# Patient Record
Sex: Female | Born: 1955
Health system: Southern US, Community
[De-identification: ages and names within clinical notes are randomized; demographics above are authoritative.]

## PROBLEM LIST (undated history)

## (undated) DIAGNOSIS — E785 Hyperlipidemia, unspecified: Secondary | ICD-10-CM

## (undated) DIAGNOSIS — K219 Gastro-esophageal reflux disease without esophagitis: Secondary | ICD-10-CM

## (undated) DIAGNOSIS — E042 Nontoxic multinodular goiter: Secondary | ICD-10-CM

## (undated) DIAGNOSIS — R911 Solitary pulmonary nodule: Secondary | ICD-10-CM

## (undated) DIAGNOSIS — I4891 Unspecified atrial fibrillation: Secondary | ICD-10-CM

## (undated) DIAGNOSIS — D518 Other vitamin B12 deficiency anemias: Secondary | ICD-10-CM

## (undated) DIAGNOSIS — Z8669 Personal history of other diseases of the nervous system and sense organs: Secondary | ICD-10-CM

## (undated) DIAGNOSIS — I10 Essential (primary) hypertension: Secondary | ICD-10-CM

## (undated) DIAGNOSIS — E782 Mixed hyperlipidemia: Secondary | ICD-10-CM

## (undated) DIAGNOSIS — E1169 Type 2 diabetes mellitus with other specified complication: Secondary | ICD-10-CM

## (undated) HISTORY — DX: Unspecified atrial fibrillation: I48.91

## (undated) HISTORY — DX: Type 2 diabetes mellitus with other specified complication: E11.69

## (undated) HISTORY — DX: Solitary pulmonary nodule: R91.1

## (undated) HISTORY — DX: Hyperlipidemia, unspecified: E78.5

## (undated) HISTORY — DX: Other vitamin B12 deficiency anemias: D51.8

## (undated) HISTORY — PX: HYSTEROTOMY: SHX1776

## (undated) HISTORY — DX: Mixed hyperlipidemia: E78.2

## (undated) HISTORY — DX: Nontoxic multinodular goiter: E04.2

## (undated) HISTORY — DX: Gastro-esophageal reflux disease without esophagitis: K21.9

## (undated) HISTORY — DX: Essential (primary) hypertension: I10

## (undated) HISTORY — DX: Personal history of other diseases of the nervous system and sense organs: Z86.69

---

## 1992-12-28 LAB — HM PAP SMEAR

## 1993-06-27 HISTORY — PX: ABDOMINAL HYSTERECTOMY: SHX81

## 2000-11-06 ENCOUNTER — Emergency Department (HOSPITAL_COMMUNITY): Admission: EM | Admit: 2000-11-06 | Discharge: 2000-11-06 | Payer: Self-pay | Admitting: Emergency Medicine

## 2004-12-28 LAB — HM COLONOSCOPY

## 2005-02-24 ENCOUNTER — Ambulatory Visit: Payer: Self-pay | Admitting: Family Medicine

## 2005-03-06 ENCOUNTER — Ambulatory Visit: Payer: Self-pay | Admitting: Internal Medicine

## 2005-03-23 ENCOUNTER — Ambulatory Visit: Payer: Self-pay | Admitting: Internal Medicine

## 2005-04-01 ENCOUNTER — Ambulatory Visit: Payer: Self-pay | Admitting: Family Medicine

## 2005-05-20 ENCOUNTER — Ambulatory Visit: Payer: Self-pay | Admitting: Family Medicine

## 2006-02-22 ENCOUNTER — Ambulatory Visit: Payer: Self-pay | Admitting: Family Medicine

## 2010-02-18 ENCOUNTER — Encounter (INDEPENDENT_AMBULATORY_CARE_PROVIDER_SITE_OTHER): Payer: Self-pay | Admitting: *Deleted

## 2010-10-03 ENCOUNTER — Telehealth: Payer: Self-pay | Admitting: Internal Medicine

## 2011-01-29 NOTE — Progress Notes (Signed)
Summary: Schedule Colonoscopy  Phone Note Outgoing Call Call back at Home Phone [phone]   Call placed by: Harlow Mares CMA Duncan Dull),  October 03, 2010 9:11 AM Call placed to: Patient Summary of Call: patient aware she is due for a colonscopy and she states that she does not have the extra money at this time and she will call back when she does have the money. I advised the patient of the importance of having her procedure due to her family history of colon cancer.  Initial call taken by: Harlow Mares CMA (AAMA),  October 03, 2010 9:15 AM

## 2011-01-29 NOTE — Letter (Signed)
Summary: Colonoscopy Letter  [facility]  [address] [place], [place] [address]   Phone: [phone]  Fax: [phone]      February 18, 2010 MRN: [id number]   Rachel Hensley [address] [place], [place]  [address]   Dear Ms. Kipnis,   According to your medical record, it is time for you to schedule a Colonoscopy. The American Cancer Society recommends this procedure as a method to detect early colon cancer. Patients with a family history of colon cancer, or a personal history of colon polyps or inflammatory bowel disease are at increased risk.  This letter has beeen generated based on the recommendations made at the time of your procedure. If you feel that in your particular situation this may no longer apply, please contact our office.  Please call our office at [phone] to schedule this appointment or to update your records at your earliest convenience.  Thank you for cooperating with [place] to provide you with the very best care possible.   Sincerely,  Iva Boop, M.D.  [facility] [phone]

## 2017-03-18 DIAGNOSIS — K219 Gastro-esophageal reflux disease without esophagitis: Secondary | ICD-10-CM | POA: Diagnosis not present

## 2017-03-18 DIAGNOSIS — E1165 Type 2 diabetes mellitus with hyperglycemia: Secondary | ICD-10-CM | POA: Diagnosis not present

## 2017-03-18 DIAGNOSIS — E782 Mixed hyperlipidemia: Secondary | ICD-10-CM | POA: Diagnosis not present

## 2017-03-18 DIAGNOSIS — I10 Essential (primary) hypertension: Secondary | ICD-10-CM | POA: Diagnosis not present

## 2017-06-22 DIAGNOSIS — I10 Essential (primary) hypertension: Secondary | ICD-10-CM | POA: Diagnosis not present

## 2017-06-22 DIAGNOSIS — E119 Type 2 diabetes mellitus without complications: Secondary | ICD-10-CM | POA: Diagnosis not present

## 2017-06-22 DIAGNOSIS — E782 Mixed hyperlipidemia: Secondary | ICD-10-CM | POA: Diagnosis not present

## 2017-06-29 DIAGNOSIS — Z1231 Encounter for screening mammogram for malignant neoplasm of breast: Secondary | ICD-10-CM | POA: Diagnosis not present

## 2017-07-08 DIAGNOSIS — R5383 Other fatigue: Secondary | ICD-10-CM | POA: Diagnosis not present

## 2017-07-08 DIAGNOSIS — R079 Chest pain, unspecified: Secondary | ICD-10-CM | POA: Diagnosis not present

## 2017-07-08 DIAGNOSIS — R072 Precordial pain: Secondary | ICD-10-CM | POA: Diagnosis not present

## 2017-07-08 DIAGNOSIS — E119 Type 2 diabetes mellitus without complications: Secondary | ICD-10-CM | POA: Diagnosis not present

## 2017-07-08 DIAGNOSIS — Z7984 Long term (current) use of oral hypoglycemic drugs: Secondary | ICD-10-CM | POA: Diagnosis not present

## 2017-07-08 DIAGNOSIS — R0602 Shortness of breath: Secondary | ICD-10-CM | POA: Diagnosis not present

## 2017-07-08 DIAGNOSIS — R1013 Epigastric pain: Secondary | ICD-10-CM | POA: Diagnosis not present

## 2017-07-08 DIAGNOSIS — R7989 Other specified abnormal findings of blood chemistry: Secondary | ICD-10-CM | POA: Diagnosis not present

## 2017-07-08 DIAGNOSIS — R918 Other nonspecific abnormal finding of lung field: Secondary | ICD-10-CM | POA: Diagnosis not present

## 2017-07-09 DIAGNOSIS — R197 Diarrhea, unspecified: Secondary | ICD-10-CM | POA: Diagnosis not present

## 2017-07-09 DIAGNOSIS — R079 Chest pain, unspecified: Secondary | ICD-10-CM | POA: Diagnosis not present

## 2017-07-09 DIAGNOSIS — I10 Essential (primary) hypertension: Secondary | ICD-10-CM | POA: Diagnosis not present

## 2017-07-09 DIAGNOSIS — R10811 Right upper quadrant abdominal tenderness: Secondary | ICD-10-CM | POA: Diagnosis not present

## 2017-07-09 DIAGNOSIS — E785 Hyperlipidemia, unspecified: Secondary | ICD-10-CM | POA: Diagnosis not present

## 2017-07-09 DIAGNOSIS — E119 Type 2 diabetes mellitus without complications: Secondary | ICD-10-CM | POA: Diagnosis not present

## 2017-07-09 DIAGNOSIS — R112 Nausea with vomiting, unspecified: Secondary | ICD-10-CM | POA: Diagnosis not present

## 2017-07-10 DIAGNOSIS — R197 Diarrhea, unspecified: Secondary | ICD-10-CM | POA: Diagnosis not present

## 2017-07-10 DIAGNOSIS — I10 Essential (primary) hypertension: Secondary | ICD-10-CM | POA: Diagnosis not present

## 2017-07-10 DIAGNOSIS — E785 Hyperlipidemia, unspecified: Secondary | ICD-10-CM | POA: Diagnosis not present

## 2017-07-10 DIAGNOSIS — E119 Type 2 diabetes mellitus without complications: Secondary | ICD-10-CM | POA: Diagnosis not present

## 2017-07-10 DIAGNOSIS — R079 Chest pain, unspecified: Secondary | ICD-10-CM | POA: Diagnosis not present

## 2017-07-15 DIAGNOSIS — T783XXA Angioneurotic edema, initial encounter: Secondary | ICD-10-CM | POA: Diagnosis not present

## 2017-07-20 DIAGNOSIS — R911 Solitary pulmonary nodule: Secondary | ICD-10-CM | POA: Diagnosis not present

## 2017-07-20 DIAGNOSIS — K858 Other acute pancreatitis without necrosis or infection: Secondary | ICD-10-CM | POA: Diagnosis not present

## 2017-07-20 DIAGNOSIS — E042 Nontoxic multinodular goiter: Secondary | ICD-10-CM | POA: Diagnosis not present

## 2017-09-10 DIAGNOSIS — L03116 Cellulitis of left lower limb: Secondary | ICD-10-CM | POA: Diagnosis not present

## 2017-09-10 DIAGNOSIS — W57XXXA Bitten or stung by nonvenomous insect and other nonvenomous arthropods, initial encounter: Secondary | ICD-10-CM | POA: Diagnosis not present

## 2017-10-04 DIAGNOSIS — H40003 Preglaucoma, unspecified, bilateral: Secondary | ICD-10-CM | POA: Diagnosis not present

## 2017-10-04 DIAGNOSIS — E119 Type 2 diabetes mellitus without complications: Secondary | ICD-10-CM | POA: Diagnosis not present

## 2017-10-04 DIAGNOSIS — H524 Presbyopia: Secondary | ICD-10-CM | POA: Diagnosis not present

## 2017-10-04 DIAGNOSIS — H2513 Age-related nuclear cataract, bilateral: Secondary | ICD-10-CM | POA: Diagnosis not present

## 2017-10-19 DIAGNOSIS — H40003 Preglaucoma, unspecified, bilateral: Secondary | ICD-10-CM | POA: Diagnosis not present

## 2017-10-25 DIAGNOSIS — E119 Type 2 diabetes mellitus without complications: Secondary | ICD-10-CM | POA: Diagnosis not present

## 2017-10-25 DIAGNOSIS — I10 Essential (primary) hypertension: Secondary | ICD-10-CM | POA: Diagnosis not present

## 2017-10-25 DIAGNOSIS — E782 Mixed hyperlipidemia: Secondary | ICD-10-CM | POA: Diagnosis not present

## 2018-01-27 DIAGNOSIS — R21 Rash and other nonspecific skin eruption: Secondary | ICD-10-CM | POA: Diagnosis not present

## 2018-01-27 DIAGNOSIS — I10 Essential (primary) hypertension: Secondary | ICD-10-CM | POA: Diagnosis not present

## 2018-01-27 DIAGNOSIS — E782 Mixed hyperlipidemia: Secondary | ICD-10-CM | POA: Diagnosis not present

## 2018-01-27 DIAGNOSIS — E119 Type 2 diabetes mellitus without complications: Secondary | ICD-10-CM | POA: Diagnosis not present

## 2018-01-31 DIAGNOSIS — D518 Other vitamin B12 deficiency anemias: Secondary | ICD-10-CM | POA: Diagnosis not present

## 2018-02-07 DIAGNOSIS — E538 Deficiency of other specified B group vitamins: Secondary | ICD-10-CM | POA: Diagnosis not present

## 2018-02-14 DIAGNOSIS — E538 Deficiency of other specified B group vitamins: Secondary | ICD-10-CM | POA: Diagnosis not present

## 2018-02-21 DIAGNOSIS — E538 Deficiency of other specified B group vitamins: Secondary | ICD-10-CM | POA: Diagnosis not present

## 2018-03-21 DIAGNOSIS — E538 Deficiency of other specified B group vitamins: Secondary | ICD-10-CM | POA: Diagnosis not present

## 2018-03-22 DIAGNOSIS — T7849XA Other allergy, initial encounter: Secondary | ICD-10-CM | POA: Diagnosis not present

## 2018-04-05 ENCOUNTER — Encounter: Payer: Self-pay | Admitting: Allergy and Immunology

## 2018-04-05 ENCOUNTER — Ambulatory Visit: Payer: Commercial Managed Care - PPO | Admitting: Allergy and Immunology

## 2018-04-05 VITALS — BP 130/80 | HR 82 | Temp 98.2°F | Resp 18 | Ht <= 58 in | Wt 163.4 lb

## 2018-04-05 DIAGNOSIS — J310 Chronic rhinitis: Secondary | ICD-10-CM | POA: Diagnosis not present

## 2018-04-05 DIAGNOSIS — H1013 Acute atopic conjunctivitis, bilateral: Secondary | ICD-10-CM | POA: Diagnosis not present

## 2018-04-05 DIAGNOSIS — H1010 Acute atopic conjunctivitis, unspecified eye: Secondary | ICD-10-CM | POA: Insufficient documentation

## 2018-04-05 DIAGNOSIS — L500 Allergic urticaria: Secondary | ICD-10-CM | POA: Diagnosis not present

## 2018-04-05 DIAGNOSIS — T7840XD Allergy, unspecified, subsequent encounter: Secondary | ICD-10-CM

## 2018-04-05 DIAGNOSIS — T783XXD Angioneurotic edema, subsequent encounter: Secondary | ICD-10-CM | POA: Diagnosis not present

## 2018-04-05 DIAGNOSIS — T783XXA Angioneurotic edema, initial encounter: Secondary | ICD-10-CM | POA: Insufficient documentation

## 2018-04-05 DIAGNOSIS — T7840XA Allergy, unspecified, initial encounter: Secondary | ICD-10-CM | POA: Insufficient documentation

## 2018-04-05 DIAGNOSIS — E042 Nontoxic multinodular goiter: Secondary | ICD-10-CM | POA: Diagnosis not present

## 2018-04-05 HISTORY — DX: Angioneurotic edema, initial encounter: T78.3XXA

## 2018-04-05 MED ORDER — AZELASTINE HCL 0.1 % NA SOLN: NASAL | 5 refills | Status: DC

## 2018-04-05 MED ORDER — CARBINOXAMINE MALEATE 6 MG PO TABS: 6.0000 mg | ORAL_TABLET | Freq: Three times a day (TID) | ORAL | 5 refills | Status: DC | PRN

## 2018-04-05 NOTE — Assessment & Plan Note (Addendum)
   I have recommended eye lubricant drops (i.e., Natural Tears) as needed.

## 2018-04-05 NOTE — Assessment & Plan Note (Signed)
   A prescription has been provided for carbinoxamine maleate 6mg  every 8 hours as needed.  A prescription has been provided for azelastine nasal spray, 1-2 sprays per nostril 2 times daily as needed. Proper nasal spray technique has been discussed and demonstrated.   Nasal saline spray (i.e., Simply Saline) or nasal saline lavage (i.e., NeilMed) is recommended as needed and prior to medicated nasal sprays.

## 2018-04-05 NOTE — Progress Notes (Signed)
New Patient Note  RE: Rachel Hensley MRN: [id number] DOB: November 30, 1956 Date of Office Visit: 04/05/2018  Referring provider: Gus HeightJohnson, Andrea, PA-C Primary care provider: Gus HeightJohnson, Andrea, PA-C  Chief Complaint: Angioedema; Allergic Reaction; and Nasal Congestion   History of present illness: Rachel Hensley is a 62 y.o. female seen today in consultation requested by Gus HeightAndrea Johnson, PA-C. She reports that in the summer 2018 she developed tongue swelling.  She was on lisinopril at that time and this medication was discontinued.  However, she has had 4 additional episodes of tongue swelling.  The 2 most recent episodes occurred 2 weeks ago and this morning.  She does experience mild prodromal tingling of the tongue.  She does not experience concomitant urticaria, cardiopulmonary symptoms, or other GI symptoms.  No specific medication, food, skin care product, detergent, soap, or other environmental triggers have been identified, she states that the angioedema "pops up out of nowhere." She has a history of allergic urticaria versus cholinergic urticaria, however this problem resolved several years ago.  During that time, she reports that she would develop "one big welt "on her back after mowing the yard.    Rachel Hensley has nasal congestion, rhinorrhea, postnasal drainage, ocular pruritus, and occasional sinus pressure.  No significant seasonal symptom variation has been noted nor have specific environmental triggers been identified.  Assessment and plan: Angioedema Unclear etiology.  Food allergen skin testing was negative today despite a positive histamine control.  The patient is not taking an ACE inhibitor. NSAIDs may exacerbate angioedema but in this case are not the underlying etiology as demonstrated by the fact that the patient has experienced angioedema in the absence of NSAIDs. There are no concomitant symptoms concerning for anaphylaxis or constitutional symptoms worrisome for an underlying  malignancy. The patient has developed urticaria in the past, however apparently not in association with the episodes of angioedema. We will order labs to rule out hereditary angioedema, acquired angioedema, urticaria associated angioedema, and other potential etiologies.   The following labs have been ordered: TSH, antithyroglobulin antibody, thyroid peroxidase antibody, FCeRI antibody, tryptase, C4, C1 esterase inhibitor (quantitative and functional), C1q, factor XII, CBC, CMP, and galactose-alpha-1,3-galactose IgE level.  The patient will be notified with further recommendations after lab results have returned.  Should symptoms recur, a  journal is to be kept recording any foods eaten, beverages consumed, medications taken, activities performed, and environmental conditions within a 6 hour period prior to the onset of symptoms. For any symptoms concerning for anaphylaxis, 911 is to be called immediately.  Chronic rhinitis  A prescription has been provided for carbinoxamine maleate 6mg  every 8 hours as needed.  A prescription has been provided for azelastine nasal spray, 1-2 sprays per nostril 2 times daily as needed. Proper nasal spray technique has been discussed and demonstrated.   Nasal saline spray (i.e., Simply Saline) or nasal saline lavage (i.e., NeilMed) is recommended as needed and prior to medicated nasal sprays.  Conjunctivitis  I have recommended eye lubricant drops (i.e., Natural Tears) as needed.   Meds ordered this encounter  Medications  . Carbinoxamine Maleate 6 MG TABS    Sig: Take 6 mg by mouth every 8 (eight) hours as needed.    Dispense:  30 tablet    Refill:  5  . azelastine (ASTELIN) 0.1 % nasal spray    Sig: 1-2 sprays per nostril 2 times daily    Dispense:  30 mL    Refill:  5    Diagnostics: Environmental skin testing:  Negative despite a positive histamine control. Food allergen skin testing: Negative despite a positive histamine control.    Physical  examination: Blood pressure 130/80, pulse 82, temperature 98.2 F (36.8 C), temperature source Oral, resp. rate 18, height 4' 4.5" (1.334 m), weight 163 lb 6.4 oz (74.1 kg), SpO2 98 %.  General: Alert, interactive, in no acute distress. HEENT: TMs pearly gray, turbinates mildly edematous without discharge, post-pharynx mildly erythematous. Neck: Supple without lymphadenopathy. Lungs: Clear to auscultation without wheezing, rhonchi or rales. CV: Normal S1, S2 without murmurs. Abdomen: Nondistended, nontender. Skin: Warm and dry, without lesions or rashes. Extremities:  No clubbing, cyanosis or edema. Neuro:   Grossly intact.  Review of systems:  Review of systems negative except as noted in HPI / PMHx or noted below: Review of Systems  Constitutional: Negative.   HENT: Negative.   Eyes: Negative.   Respiratory: Negative.   Cardiovascular: Negative.   Gastrointestinal: Negative.   Genitourinary: Negative.   Musculoskeletal: Negative.   Skin: Negative.   Neurological: Negative.   Endo/Heme/Allergies: Negative.   Psychiatric/Behavioral: Negative.     Past medical history:  History reviewed. No pertinent past medical history.  Past surgical history:  Past Surgical History:  Procedure Laterality Date  . HYSTEROTOMY     partial    Family history: Family History  Problem Relation Age of Onset  . Eczema Grandchild   . Atopy Grandchild   . Asthma Son        childhood asthma  . Allergic rhinitis Neg Hx   . Angioedema Neg Hx   . Immunodeficiency Neg Hx   . Urticaria Neg Hx     Social history: Social History   Socioeconomic History  . Marital status: Married    Spouse name: Not on file  . Number of children: Not on file  . Years of education: Not on file  . Highest education level: Not on file  Occupational History  . Not on file  Social Needs  . Financial resource strain: Not on file  . Food insecurity:    Worry: Not on file    Inability: Not on file  .  Transportation needs:    Medical: Not on file    Non-medical: Not on file  Tobacco Use  . Smoking status: Never Smoker  . Smokeless tobacco: Never Used  Substance and Sexual Activity  . Alcohol use: Never    Frequency: Never  . Drug use: Never  . Sexual activity: Not on file  Lifestyle  . Physical activity:    Days per week: Not on file    Minutes per session: Not on file  . Stress: Not on file  Relationships  . Social connections:    Talks on phone: Not on file    Gets together: Not on file    Attends religious service: Not on file    Active member of club or organization: Not on file    Attends meetings of clubs or organizations: Not on file    Relationship status: Not on file  . Intimate partner violence:    Fear of current or ex partner: Not on file    Emotionally abused: Not on file    Physically abused: Not on file    Forced sexual activity: Not on file  Other Topics Concern  . Not on file  Social History Narrative  . Not on file   Environmental History: The patient lives in a 64 year old house with carpeting throughout and central air/heat.  She is  a non-smoker without pets.  There is no known mold/water damage in the home.  Allergies as of 04/05/2018      Reactions   Prednisone Swelling      Medication List        Accurate as of 04/05/18 12:38 PM. Always use your most recent med list.          atorvastatin 20 MG tablet Commonly known as:  LIPITOR Take 20 mg by mouth daily.   azelastine 0.1 % nasal spray Commonly known as:  ASTELIN 1-2 sprays per nostril 2 times daily   bisoprolol-hydrochlorothiazide 5-6.25 MG tablet Commonly known as:  ZIAC Take 6.25 tablets by mouth daily.   Carbinoxamine Maleate 6 MG Tabs Take 6 mg by mouth every 8 (eight) hours as needed.   cetirizine 10 MG tablet Commonly known as:  ZYRTEC Take 10 mg by mouth daily.   EPINEPHrine 0.3 mg/0.3 mL Soaj injection Commonly known as:  EPI-PEN Inject 0.3 mg into the muscle once.    EUCRISA 2 % Oint Generic drug:  Crisaborole Apply 1 application topically daily.   glimepiride 4 MG tablet Commonly known as:  AMARYL Take 4 mg by mouth daily.   GLYXAMBI 25-5 MG Tabs Generic drug:  Empagliflozin-Linagliptin Take 25 mg by mouth daily.   levocetirizine 5 MG tablet Commonly known as:  XYZAL Take 5 mg by mouth every evening.   metFORMIN 1000 MG tablet Commonly known as:  GLUCOPHAGE Take 1,000 mg by mouth 2 (two) times daily.   omeprazole 20 MG capsule Commonly known as:  PRILOSEC Take 20 mg by mouth 2 (two) times daily.   promethazine 25 MG tablet Commonly known as:  PHENERGAN Take 25 mg by mouth every 6 (six) hours as needed for nausea or vomiting.   ranitidine 150 MG capsule Commonly known as:  ZANTAC Take 150 mg by mouth daily.       Known medication allergies: Allergies  Allergen Reactions  . Prednisone Swelling    I appreciate the opportunity to take part in Micky's care. Please do not hesitate to contact me with questions.  Sincerely,   R. Jorene Guest, MD

## 2018-04-05 NOTE — Assessment & Plan Note (Signed)
Unclear etiology.  Food allergen skin testing was negative today despite a positive histamine control.  The patient is not taking an ACE inhibitor. NSAIDs may exacerbate angioedema but in this case are not the underlying etiology as demonstrated by the fact that the patient has experienced angioedema in the absence of NSAIDs. There are no concomitant symptoms concerning for anaphylaxis or constitutional symptoms worrisome for an underlying malignancy. The patient has developed urticaria in the past, however apparently not in association with the episodes of angioedema. We will order labs to rule out hereditary angioedema, acquired angioedema, urticaria associated angioedema, and other potential etiologies.   The following labs have been ordered: TSH, antithyroglobulin antibody, thyroid peroxidase antibody, FCeRI antibody, tryptase, C4, C1 esterase inhibitor (quantitative and functional), C1q, factor XII, CBC, CMP, and galactose-alpha-1,3-galactose IgE level.  The patient will be notified with further recommendations after lab results have returned.  Should symptoms recur, a  journal is to be kept recording any foods eaten, beverages consumed, medications taken, activities performed, and environmental conditions within a 6 hour period prior to the onset of symptoms. For any symptoms concerning for anaphylaxis, 911 is to be called immediately.

## 2018-04-05 NOTE — Patient Instructions (Addendum)
Angioedema Unclear etiology.  Food allergen skin testing was negative today despite a positive histamine control.  The patient is not taking an ACE inhibitor. NSAIDs may exacerbate angioedema but in this case are not the underlying etiology as demonstrated by the fact that the patient has experienced angioedema in the absence of NSAIDs. There are no concomitant symptoms concerning for anaphylaxis or constitutional symptoms worrisome for an underlying malignancy. The patient has developed urticaria in the past, however apparently not in association with the episodes of angioedema. We will order labs to rule out hereditary angioedema, acquired angioedema, urticaria associated angioedema, and other potential etiologies.   The following labs have been ordered: TSH, antithyroglobulin antibody, thyroid peroxidase antibody, FCeRI antibody, tryptase, C4, C1 esterase inhibitor (quantitative and functional), C1q, factor XII, CBC, CMP, and galactose-alpha-1,3-galactose IgE level.  The patient will be notified with further recommendations after lab results have returned.  Should symptoms recur, a  journal is to be kept recording any foods eaten, beverages consumed, medications taken, activities performed, and environmental conditions within a 6 hour period prior to the onset of symptoms. For any symptoms concerning for anaphylaxis, 911 is to be called immediately.  Chronic rhinitis  A prescription has been provided for carbinoxamine maleate 6mg  every 8 hours as needed.  A prescription has been provided for azelastine nasal spray, 1-2 sprays per nostril 2 times daily as needed. Proper nasal spray technique has been discussed and demonstrated.   Nasal saline spray (i.e., Simply Saline) or nasal saline lavage (i.e., NeilMed) is recommended as needed and prior to medicated nasal sprays.  Conjunctivitis  I have recommended eye lubricant drops (i.e., Natural Tears) as needed.   When lab results have returned the  patient will be called with further recommendations and follow up instructions.

## 2018-04-06 ENCOUNTER — Telehealth: Payer: Self-pay

## 2018-04-06 NOTE — Telephone Encounter (Signed)
Pharmacy called and stated that insurance is requiring patient to try and fail Carbinoxamine Maleate 4 mg tab or liquid. Please advise

## 2018-04-06 NOTE — Telephone Encounter (Signed)
Carbinoxamine 4 mg every 8 hours as needed.

## 2018-04-07 MED ORDER — CARBINOXAMINE MALEATE 4 MG PO TABS: 1.0000 | ORAL_TABLET | Freq: Three times a day (TID) | ORAL | 3 refills | Status: DC | PRN

## 2018-04-07 NOTE — Addendum Note (Signed)
Addended by: Mliss FritzBLACK, Sentoria Brent I on: 04/07/2018 08:18 AM   Modules accepted: Orders

## 2018-04-07 NOTE — Telephone Encounter (Signed)
Prescription has been sent in and I left a detailed message for the patient advising her of medication change.

## 2018-04-12 LAB — COMPREHENSIVE METABOLIC PANEL
ALT: 22 IU/L (ref 0–32)
AST: 15 IU/L (ref 0–40)
Albumin/Globulin Ratio: 1.3 (ref 1.2–2.2)
Albumin: 4.3 g/dL (ref 3.6–4.8)
Alkaline Phosphatase: 91 IU/L (ref 39–117)
BUN/Creatinine Ratio: 22 (ref 12–28)
BUN: 16 mg/dL (ref 8–27)
Bilirubin Total: 0.4 mg/dL (ref 0.0–1.2)
CO2: 25 mmol/L (ref 20–29)
Calcium: 9.9 mg/dL (ref 8.7–10.3)
Chloride: 100 mmol/L (ref 96–106)
Creatinine, Ser: 0.72 mg/dL (ref 0.57–1.00)
GFR calc Af Amer: 103 mL/min/{1.73_m2} (ref >59)
GFR calc non Af Amer: 89 mL/min/{1.73_m2} (ref >59)
Globulin, Total: 3.2 g/dL (ref 1.5–4.5)
Glucose: 103 mg/dL — ABNORMAL HIGH (ref 65–99)
Potassium: 4.3 mmol/L (ref 3.5–5.2)
Sodium: 142 mmol/L (ref 134–144)
Total Protein: 7.5 g/dL (ref 6.0–8.5)

## 2018-04-12 LAB — ALPHA-GAL PANEL
Alpha Gal IgE*: <0.1 kU/L (ref <0.10)
Beef (Bos spp) IgE: 0.1 kU/L (ref <0.35)
Class Interpretation: 0
Class Interpretation: 0
Lamb/Mutton (Ovis spp) IgE: <0.1 kU/L (ref <0.35)
Pork (Sus spp) IgE: <0.1 kU/L (ref <0.35)

## 2018-04-12 LAB — CBC
Hematocrit: 37.4 % (ref 34.0–46.6)
Hemoglobin: 11.8 g/dL (ref 11.1–15.9)
MCH: 24.5 pg — ABNORMAL LOW (ref 26.6–33.0)
MCHC: 31.6 g/dL (ref 31.5–35.7)
MCV: 78 fL — ABNORMAL LOW (ref 79–97)
Platelets: 392 10*3/uL — ABNORMAL HIGH (ref 150–379)
RBC: 4.81 x10E6/uL (ref 3.77–5.28)
RDW: 18.5 % — ABNORMAL HIGH (ref 12.3–15.4)
WBC: 13.4 10*3/uL — ABNORMAL HIGH (ref 3.4–10.8)

## 2018-04-12 LAB — TRYPTASE: Tryptase: 15.5 ug/L — ABNORMAL HIGH (ref 2.2–13.2)

## 2018-04-12 LAB — CHRONIC URTICARIA: cu index: <1.8 (ref <10)

## 2018-04-12 LAB — C4 COMPLEMENT: Complement C4, Serum: 33 mg/dL (ref 14–44)

## 2018-04-12 LAB — C1 ESTERASE INHIBITOR: C1INH SerPl-mCnc: 28 mg/dL (ref 21–39)

## 2018-04-12 LAB — THYROID PEROXIDASE ANTIBODY: Thyroperoxidase Ab SerPl-aCnc: 11 IU/mL (ref 0–34)

## 2018-04-12 LAB — THYROGLOBULIN LEVEL: Thyroglobulin (TG-RIA): 30 ng/mL

## 2018-04-12 LAB — FACTOR 12 ASSAY: Factor XII Activity: 134 % (ref 50–150)

## 2018-04-12 LAB — COMPLEMENT COMPONENT C1Q: Complement C1Q: 13.7 mg/dL (ref 11.8–24.4)

## 2018-04-12 LAB — C1 ESTERASE INHIBITOR, FUNCTIONAL: C1INH Functional/C1INH Total MFr SerPl: 91 %mean normal

## 2018-04-13 DIAGNOSIS — E042 Nontoxic multinodular goiter: Secondary | ICD-10-CM | POA: Diagnosis not present

## 2018-04-21 DIAGNOSIS — E538 Deficiency of other specified B group vitamins: Secondary | ICD-10-CM | POA: Diagnosis not present

## 2018-04-21 DIAGNOSIS — H40003 Preglaucoma, unspecified, bilateral: Secondary | ICD-10-CM | POA: Diagnosis not present

## 2018-04-28 DIAGNOSIS — I10 Essential (primary) hypertension: Secondary | ICD-10-CM | POA: Diagnosis not present

## 2018-04-28 DIAGNOSIS — D518 Other vitamin B12 deficiency anemias: Secondary | ICD-10-CM | POA: Diagnosis not present

## 2018-04-28 DIAGNOSIS — E782 Mixed hyperlipidemia: Secondary | ICD-10-CM | POA: Diagnosis not present

## 2018-04-28 DIAGNOSIS — E1169 Type 2 diabetes mellitus with other specified complication: Secondary | ICD-10-CM | POA: Diagnosis not present

## 2018-05-18 DIAGNOSIS — D509 Iron deficiency anemia, unspecified: Secondary | ICD-10-CM | POA: Diagnosis not present

## 2018-05-18 DIAGNOSIS — Z800 Family history of malignant neoplasm of digestive organs: Secondary | ICD-10-CM | POA: Diagnosis not present

## 2018-05-18 DIAGNOSIS — Z8051 Family history of malignant neoplasm of kidney: Secondary | ICD-10-CM | POA: Diagnosis not present

## 2018-05-18 DIAGNOSIS — Z8042 Family history of malignant neoplasm of prostate: Secondary | ICD-10-CM | POA: Diagnosis not present

## 2018-05-24 DIAGNOSIS — E538 Deficiency of other specified B group vitamins: Secondary | ICD-10-CM | POA: Diagnosis not present

## 2018-06-20 DIAGNOSIS — E538 Deficiency of other specified B group vitamins: Secondary | ICD-10-CM | POA: Diagnosis not present

## 2018-07-25 DIAGNOSIS — E538 Deficiency of other specified B group vitamins: Secondary | ICD-10-CM | POA: Diagnosis not present

## 2018-08-01 DIAGNOSIS — D518 Other vitamin B12 deficiency anemias: Secondary | ICD-10-CM | POA: Diagnosis not present

## 2018-08-01 DIAGNOSIS — E1169 Type 2 diabetes mellitus with other specified complication: Secondary | ICD-10-CM | POA: Diagnosis not present

## 2018-08-01 DIAGNOSIS — E782 Mixed hyperlipidemia: Secondary | ICD-10-CM | POA: Diagnosis not present

## 2018-08-01 DIAGNOSIS — I10 Essential (primary) hypertension: Secondary | ICD-10-CM | POA: Diagnosis not present

## 2018-08-01 DIAGNOSIS — R002 Palpitations: Secondary | ICD-10-CM | POA: Diagnosis not present

## 2018-08-23 DIAGNOSIS — D518 Other vitamin B12 deficiency anemias: Secondary | ICD-10-CM | POA: Diagnosis not present

## 2018-08-23 DIAGNOSIS — Z1231 Encounter for screening mammogram for malignant neoplasm of breast: Secondary | ICD-10-CM | POA: Diagnosis not present

## 2018-09-21 DIAGNOSIS — D518 Other vitamin B12 deficiency anemias: Secondary | ICD-10-CM | POA: Diagnosis not present

## 2018-10-20 DIAGNOSIS — D518 Other vitamin B12 deficiency anemias: Secondary | ICD-10-CM | POA: Diagnosis not present

## 2018-11-02 DIAGNOSIS — I10 Essential (primary) hypertension: Secondary | ICD-10-CM | POA: Diagnosis not present

## 2018-11-02 DIAGNOSIS — D518 Other vitamin B12 deficiency anemias: Secondary | ICD-10-CM | POA: Diagnosis not present

## 2018-11-02 DIAGNOSIS — E1169 Type 2 diabetes mellitus with other specified complication: Secondary | ICD-10-CM | POA: Diagnosis not present

## 2018-11-02 DIAGNOSIS — E782 Mixed hyperlipidemia: Secondary | ICD-10-CM | POA: Diagnosis not present

## 2018-12-28 LAB — HM DIABETES EYE EXAM

## 2019-01-13 DIAGNOSIS — H524 Presbyopia: Secondary | ICD-10-CM | POA: Diagnosis not present

## 2019-01-13 DIAGNOSIS — H40003 Preglaucoma, unspecified, bilateral: Secondary | ICD-10-CM | POA: Diagnosis not present

## 2019-02-03 DIAGNOSIS — I10 Essential (primary) hypertension: Secondary | ICD-10-CM | POA: Diagnosis not present

## 2019-02-03 DIAGNOSIS — E1169 Type 2 diabetes mellitus with other specified complication: Secondary | ICD-10-CM | POA: Diagnosis not present

## 2019-02-03 DIAGNOSIS — E782 Mixed hyperlipidemia: Secondary | ICD-10-CM | POA: Diagnosis not present

## 2019-05-10 DIAGNOSIS — E1169 Type 2 diabetes mellitus with other specified complication: Secondary | ICD-10-CM | POA: Diagnosis not present

## 2019-05-10 DIAGNOSIS — I10 Essential (primary) hypertension: Secondary | ICD-10-CM | POA: Diagnosis not present

## 2019-05-10 DIAGNOSIS — E782 Mixed hyperlipidemia: Secondary | ICD-10-CM | POA: Diagnosis not present

## 2019-05-10 LAB — MICROALBUMIN, URINE: Microalb, Ur: POSITIVE

## 2019-05-18 DIAGNOSIS — J060 Acute laryngopharyngitis: Secondary | ICD-10-CM | POA: Diagnosis not present

## 2019-11-21 LAB — HM MAMMOGRAPHY: HM Mammogram: NORMAL (ref 0–4)

## 2020-02-15 ENCOUNTER — Other Ambulatory Visit: Payer: Self-pay

## 2020-02-15 MED ORDER — GLIMEPIRIDE 4 MG PO TABS: 4.0000 mg | ORAL_TABLET | Freq: Two times a day (BID) | ORAL | 0 refills | Status: DC

## 2020-02-15 MED ORDER — BISOPROLOL-HYDROCHLOROTHIAZIDE 5-6.25 MG PO TABS: 6.2500 | ORAL_TABLET | Freq: Every day | ORAL | 0 refills | Status: DC

## 2020-02-16 ENCOUNTER — Ambulatory Visit: Payer: Commercial Managed Care - PPO | Admitting: Family Medicine

## 2020-02-19 ENCOUNTER — Ambulatory Visit: Payer: Commercial Managed Care - PPO | Admitting: Family Medicine

## 2020-02-19 ENCOUNTER — Other Ambulatory Visit: Payer: Self-pay

## 2020-02-19 ENCOUNTER — Encounter: Payer: Self-pay | Admitting: Family Medicine

## 2020-02-19 VITALS — BP 132/76 | HR 80 | Temp 98.2°F | Resp 16 | Ht 63.0 in | Wt 154.2 lb

## 2020-02-19 DIAGNOSIS — E782 Mixed hyperlipidemia: Secondary | ICD-10-CM | POA: Diagnosis not present

## 2020-02-19 DIAGNOSIS — Z23 Encounter for immunization: Secondary | ICD-10-CM

## 2020-02-19 DIAGNOSIS — I152 Hypertension secondary to endocrine disorders: Secondary | ICD-10-CM | POA: Insufficient documentation

## 2020-02-19 DIAGNOSIS — I10 Essential (primary) hypertension: Secondary | ICD-10-CM | POA: Diagnosis not present

## 2020-02-19 DIAGNOSIS — K219 Gastro-esophageal reflux disease without esophagitis: Secondary | ICD-10-CM

## 2020-02-19 DIAGNOSIS — E785 Hyperlipidemia, unspecified: Secondary | ICD-10-CM | POA: Insufficient documentation

## 2020-02-19 DIAGNOSIS — E1121 Type 2 diabetes mellitus with diabetic nephropathy: Secondary | ICD-10-CM | POA: Insufficient documentation

## 2020-02-19 DIAGNOSIS — E1159 Type 2 diabetes mellitus with other circulatory complications: Secondary | ICD-10-CM | POA: Insufficient documentation

## 2020-02-19 DIAGNOSIS — Z6827 Body mass index (BMI) 27.0-27.9, adult: Secondary | ICD-10-CM | POA: Insufficient documentation

## 2020-02-19 HISTORY — DX: Type 2 diabetes mellitus with diabetic nephropathy: E11.21

## 2020-02-19 LAB — POCT UA - MICROALBUMIN: Microalbumin Ur, POC: 150 mg/L

## 2020-02-19 NOTE — Assessment & Plan Note (Signed)
Well controlled.  No changes to medicines.  Continue to work on eating a healthy diet and exercise.  Labs drawn today.  Recommend check sugars daily.

## 2020-02-19 NOTE — Assessment & Plan Note (Signed)
Well controlled.  No changes to medicines.  Continue to work on eating a healthy diet and exercise.  Labs drawn today.  Unable take ace inhibitors  Or arbs.

## 2020-02-19 NOTE — Assessment & Plan Note (Signed)
Well controlled.  ?No changes to medicines.  ?Continue to work on eating a healthy diet and exercise.  ?Labs drawn today.  ?

## 2020-02-19 NOTE — Assessment & Plan Note (Signed)
Pneumovax 23 given.

## 2020-02-19 NOTE — Patient Instructions (Signed)
Essential hypertension, benign Well controlled.  No changes to medicines.  Continue to work on eating a healthy diet and exercise.  Labs drawn today.   GERD without esophagitis Well controlled.  No changes to medicines.  Continue to work on eating a healthy diet and exercise.  Labs drawn today.   Diabetic glomerulopathy (HCC) Well controlled.  No changes to medicines.  Continue to work on eating a healthy diet and exercise.  Labs drawn today.  Unable take ace inhibitors  Or arbs.  Mixed hyperlipidemia Well controlled.  No changes to medicines.  Continue to work on eating a healthy diet and exercise.  Labs drawn today.  Recommend check sugars daily.   Diabetes Basics  Diabetes (diabetes mellitus) is a long-term (chronic) disease. It occurs when the body does not properly use sugar (glucose) that is released from food after you eat. Diabetes may be caused by one or both of these problems:  Your pancreas does not make enough of a hormone called insulin.  Your body does not react in a normal way to insulin that it makes. Insulin lets sugars (glucose) go into cells in your body. This gives you energy. If you have diabetes, sugars cannot get into cells. This causes high blood sugar (hyperglycemia). Follow these instructions at home: How is diabetes treated? You may need to take insulin or other diabetes medicines daily to keep your blood sugar in balance. Take your diabetes medicines every day as told by your doctor. List your diabetes medicines here: Diabetes medicines  Name of medicine: ______________________________ ? Amount (dose): _______________ Time (a.m./p.m.): _______________ Notes: ___________________________________  Name of medicine: ______________________________ ? Amount (dose): _______________ Time (a.m./p.m.): _______________ Notes: ___________________________________  Name of medicine: ______________________________ ? Amount (dose): _______________ Time  (a.m./p.m.): _______________ Notes: ___________________________________ If you use insulin, you will learn how to give yourself insulin by injection. You may need to adjust the amount based on the food that you eat. List the types of insulin you use here: Insulin  Insulin type: ______________________________ ? Amount (dose): _______________ Time (a.m./p.m.): _______________ Notes: ___________________________________  Insulin type: ______________________________ ? Amount (dose): _______________ Time (a.m./p.m.): _______________ Notes: ___________________________________  Insulin type: ______________________________ ? Amount (dose): _______________ Time (a.m./p.m.): _______________ Notes: ___________________________________  Insulin type: ______________________________ ? Amount (dose): _______________ Time (a.m./p.m.): _______________ Notes: ___________________________________  Insulin type: ______________________________ ? Amount (dose): _______________ Time (a.m./p.m.): _______________ Notes: ___________________________________ How do I manage my blood sugar?  Check your blood sugar levels using a blood glucose monitor as directed by your doctor. Your doctor will set treatment goals for you. Generally, you should have these blood sugar levels:  Before meals (preprandial): 80-130 mg/dL (9.3-8.1 mmol/L).  After meals (postprandial): below 180 mg/dL (10 mmol/L).  A1c level: less than 7%. Write down the times that you will check your blood sugar levels: Blood sugar checks  Time: _______________ Notes: ___________________________________  Time: _______________ Notes: ___________________________________  Time: _______________ Notes: ___________________________________  Time: _______________ Notes: ___________________________________  Time: _______________ Notes: ___________________________________  Time: _______________ Notes: ___________________________________  What do I need  to know about low blood sugar? Low blood sugar is called hypoglycemia. This is when blood sugar is at or below 70 mg/dL (3.9 mmol/L). Symptoms may include:  Feeling: ? Hungry. ? Worried or nervous (anxious). ? Sweaty and clammy. ? Confused. ? Dizzy. ? Sleepy. ? Sick to your stomach (nauseous).  Having: ? A fast heartbeat. ? A headache. ? A change in your vision. ? Tingling or no feeling (numbness) around the mouth, lips, or tongue. ?  Jerky movements that you cannot control (seizure).  Having trouble with: ? Moving (coordination). ? Sleeping. ? Passing out (fainting). ? Getting upset easily (irritability). Treating low blood sugar To treat low blood sugar, eat or drink something sugary right away. If you can think clearly and swallow safely, follow the 15:15 rule:  Take 15 grams of a fast-acting carb (carbohydrate). Talk with your doctor about how much you should take.  Some fast-acting carbs are: ? Sugar tablets (glucose pills). Take 3-4 glucose pills. ? 6-8 pieces of hard candy. ? 4-6 oz (120-150 mL) of fruit juice. ? 4-6 oz (120-150 mL) of regular (not diet) soda. ? 1 Tbsp (15 mL) honey or sugar.  Check your blood sugar 15 minutes after you take the carb.  If your blood sugar is still at or below 70 mg/dL (3.9 mmol/L), take 15 grams of a carb again.  If your blood sugar does not go above 70 mg/dL (3.9 mmol/L) after 3 tries, get help right away.  After your blood sugar goes back to normal, eat a meal or a snack within 1 hour. Treating very low blood sugar If your blood sugar is at or below 54 mg/dL (3 mmol/L), you have very low blood sugar (severe hypoglycemia). This is an emergency. Do not wait to see if the symptoms will go away. Get medical help right away. Call your local emergency services (911 in the [place] Do not drive yourself to the hospital. Questions to ask your health care provider  Do I need to meet with a diabetes educator?  What equipment will I need  to care for myself at home?  What diabetes medicines do I need? When should I take them?  How often do I need to check my blood sugar?  What number can I call if I have questions?  When is my next doctor's visit?  Where can I find a support group for people with diabetes? Where to find more information  American Diabetes Association: www.diabetes.org  American Association of Diabetes Educators: www.diabeteseducator.org/patient-resources Contact a doctor if:  Your blood sugar is at or above 240 mg/dL (13.3 mmol/L) for 2 days in a row.  You have been sick or have had a fever for 2 days or more, and you are not getting better.  You have any of these problems for more than 6 hours: ? You cannot eat or drink. ? You feel sick to your stomach (nauseous). ? You throw up (vomit). ? You have watery poop (diarrhea). Get help right away if:  Your blood sugar is lower than 54 mg/dL (3 mmol/L).  You get confused.  You have trouble: ? Thinking clearly. ? Breathing. Summary  Diabetes (diabetes mellitus) is a long-term (chronic) disease. It occurs when the body does not properly use sugar (glucose) that is released from food after digestion.  Take insulin and diabetes medicines as told.  Check your blood sugar every day, as often as told.  Keep all follow-up visits as told by your doctor. This is important. This information is not intended to replace advice given to you by your health care provider. Make sure you discuss any questions you have with your health care provider. Document Revised: 09/06/2019 Document Reviewed: 03/18/2018 Elsevier Patient Education  [place].

## 2020-02-19 NOTE — Assessment & Plan Note (Addendum)
Well controlled.  ?No changes to medicines.  ?Continue to work on eating a healthy diet and exercise.  ?Labs drawn today.  ?

## 2020-02-19 NOTE — Progress Notes (Signed)
Subjective:  Patient ID: Rachel Hensley, female    DOB: 09-28-1956  Age: 64 y.o. MRN: [id number]  Chief Complaint  Patient presents with  . Hyperlipidemia  . Hypertension  . Diabetes  . Gastroesophageal Reflux   HPI: Patient is a 64 year old white female who presents for follow-up of hypertension, hyperlipidemia, GERD, anemia, and diabetes.  She has been a diabetic since 2006.  She checks her sugars approximately once every other week.  The patient does try to eat healthy.  Exercise has been decreased due to cold weather.  Patient denies polydipsia, polyphagia or polyuria.  Denies neuropathy.  Denies blurry vision.  She does report that she checks her feet daily.  Her last eye exam was January/2020 and showed no diabetic retinopathy.  Patient's current medications include Synjardy XR 22/2979 once daily, Trulicity 1.5 mg once weekly, and glimepiride 4 mg 1 twice daily.  For blood pressure she is currently on bisoprolol/HCT once daily.  For her hyperlipidemia she takes atorvastatin statin 20 mg once daily at night.  For her reflux she is well controlled on omeprazole 20 mg 1 twice daily.    Social Hx   Social History   Socioeconomic History  . Marital status: Married    Spouse name: Not on file  . Number of children: 3  . Years of education: Not on file  . Highest education level: Not on file  Occupational History  . Occupation: Insurance account manager  Tobacco Use  . Smoking status: Never Smoker  . Smokeless tobacco: Never Used  Substance and Sexual Activity  . Alcohol use: Never  . Drug use: Never  . Sexual activity: Not on file  Other Topics Concern  . Not on file  Social History Narrative  . Not on file   Social Determinants of Health   Financial Resource Strain:   . Difficulty of Paying Living Expenses: Not on file  Food Insecurity:   . Worried About Charity fundraiser in the Last Year: Not on file  . Ran Out of Food in the Last Year: Not on file  Transportation Needs:   . Lack of  Transportation (Medical): Not on file  . Lack of Transportation (Non-Medical): Not on file  Physical Activity:   . Days of Exercise per Week: Not on file  . Minutes of Exercise per Session: Not on file  Stress:   . Feeling of Stress : Not on file  Social Connections:   . Frequency of Communication with Friends and Family: Not on file  . Frequency of Social Gatherings with Friends and Family: Not on file  . Attends Religious Services: Not on file  . Active Member of Clubs or Organizations: Not on file  . Attends Archivist Meetings: Not on file  . Marital Status: Not on file   Past Medical History:  Diagnosis Date  . Essential (primary) hypertension   . Gastro-esophageal reflux disease without esophagitis   . Mixed hyperlipidemia   . Nontoxic multinodular goiter   . Other vitamin B12 deficiency anemias   . Solitary pulmonary nodule   . Type 2 diabetes mellitus with other specified complication (HCC)    Family History  Problem Relation Age of Onset  . Eczema Grandchild   . Atopy Grandchild   . Asthma Son        childhood asthma  . Coronary artery disease Mother   . Glaucoma Mother   . Colon cancer Father   . Kidney cancer Brother   . Diabetes  type II Brother   . Allergic rhinitis Neg Hx   . Angioedema Neg Hx   . Immunodeficiency Neg Hx   . Urticaria Neg Hx     Review of Systems  Constitutional: Negative for chills, fatigue and fever.  HENT: Negative for congestion, ear pain and sore throat.   Respiratory: Negative for cough and shortness of breath.   Cardiovascular: Negative for chest pain.  Gastrointestinal: Negative for abdominal pain, constipation, diarrhea, nausea and vomiting.  Endocrine: Negative for polydipsia, polyphagia and polyuria.  Genitourinary: Negative for dysuria and urgency.  Musculoskeletal: Negative for arthralgias and myalgias.  Neurological: Negative for dizziness and headaches.  Psychiatric/Behavioral: Negative for dysphoric mood. The  patient is not nervous/anxious.      Objective:  BP 132/76 (BP Location: Left Arm, Patient Position: Sitting, Cuff Size: Normal)   Pulse 80   Temp 98.2 F (36.8 C)   Resp 16   Ht 5\' 3"  (1.6 m)   Wt 154 lb 3.2 oz (69.9 kg)   BMI 27.32 kg/m   BP/Weight 02/19/2020 04/05/2018  Systolic BP 132 130  Diastolic BP 76 80  Wt. (Lbs) 154.2 163.4  BMI 27.32 41.68    Physical Exam Vitals reviewed.  Constitutional:      General: She is not in acute distress.    Appearance: Normal appearance. She is obese.  HENT:     Nose: No congestion or rhinorrhea.  Neck:     Thyroid: No thyroid mass.     Vascular: No carotid bruit.  Cardiovascular:     Rate and Rhythm: Normal rate and regular rhythm.     Pulses: Normal pulses.     Heart sounds: No murmur.  Pulmonary:     Effort: Pulmonary effort is normal.     Breath sounds: Normal breath sounds.  Abdominal:     General: Bowel sounds are normal.     Palpations: Abdomen is soft. There is no mass.     Tenderness: There is no abdominal tenderness.  Lymphadenopathy:     Cervical: No cervical adenopathy.  Skin:    General: Skin is warm and dry.  Neurological:     Mental Status: She is alert and oriented to person, place, and time.     Cranial Nerves: No cranial nerve deficit.  Psychiatric:        Mood and Affect: Mood normal.        Behavior: Behavior normal.    Diabetic Foot Exam - Simple   Simple Foot Form Visual Inspection No deformities, no ulcerations, no other skin breakdown bilaterally: Yes Sensation Testing Intact to touch and monofilament testing bilaterally: Yes Pulse Check Posterior Tibialis and Dorsalis pulse intact bilaterally: Yes Comments Patient's right foot has fractures of 2nd and 3rd phalanges. Bruised. Tender.        Lab Results  Component Value Date   WBC 13.4 (H) 04/05/2018   HGB 11.8 04/05/2018   HCT 37.4 04/05/2018   PLT 392 (H) 04/05/2018   GLUCOSE 103 (H) 04/05/2018   ALT 22 04/05/2018   AST 15  04/05/2018   NA 142 04/05/2018   K 4.3 04/05/2018   CL 100 04/05/2018   CREATININE 0.72 04/05/2018   BUN 16 04/05/2018   CO2 25 04/05/2018   MICROALBUR 150 02/19/2020      Assessment & Plan:   Problem List Items Addressed This Visit      Cardiovascular and Mediastinum   Essential hypertension, benign    Well controlled.  No changes to medicines.  Continue to work on eating a healthy diet and exercise.  Labs drawn today.       Relevant Orders   CBC with Differential/Platelet   Comp. Metabolic Panel (12)     Digestive   GERD without esophagitis    Well controlled.  No changes to medicines.  Continue to work on eating a healthy diet and exercise.  Labs drawn today.         Endocrine   Diabetic glomerulopathy (HCC) - Primary    Well controlled.  No changes to medicines.  Continue to work on eating a healthy diet and exercise.  Labs drawn today.  Unable take ace inhibitors  Or arbs.      Relevant Orders   POCT UA - Microalbumin (Completed)   Hemoglobin A1c     Other   Mixed hyperlipidemia    Well controlled.  No changes to medicines.  Continue to work on eating a healthy diet and exercise.  Labs drawn today.  Recommend check sugars daily.       Relevant Orders   Lipid panel   BMI 27.0-27.9,adult   Need for Streptococcus pneumoniae vaccination    Pneumovax 23 given.       Relevant Orders   Pneumococcal polysaccharide vaccine 23-valent greater than or equal to 2yo subcutaneous/IM (Completed)      Essential hypertension, benign Well controlled.  No changes to medicines.  Continue to work on eating a healthy diet and exercise.  Labs drawn today.   GERD without esophagitis Well controlled.  No changes to medicines.  Continue to work on eating a healthy diet and exercise.  Labs drawn today.   Diabetic glomerulopathy (HCC) Well controlled.  No changes to medicines.  Continue to work on eating a healthy diet and exercise.  Labs drawn today.    Unable take ace inhibitors  Or arbs.  Mixed hyperlipidemia Well controlled.  No changes to medicines.  Continue to work on eating a healthy diet and exercise.  Labs drawn today.  Recommend check sugars daily.   Need for Streptococcus pneumoniae vaccination Pneumovax 23 given.   Follow up in 3 months fasting.  Blane Ohara [facility] [phone]

## 2020-02-20 LAB — CBC WITH DIFFERENTIAL/PLATELET
Basophils Absolute: 0 10*3/uL (ref 0.0–0.2)
Basos: 0 %
EOS (ABSOLUTE): 0.3 10*3/uL (ref 0.0–0.4)
Eos: 3 %
Hematocrit: 41.9 % (ref 34.0–46.6)
Hemoglobin: 13.3 g/dL (ref 11.1–15.9)
Immature Grans (Abs): 0.1 10*3/uL (ref 0.0–0.1)
Immature Granulocytes: 1 %
Lymphocytes Absolute: 3.2 10*3/uL — ABNORMAL HIGH (ref 0.7–3.1)
Lymphs: 31 %
MCH: 26.4 pg — ABNORMAL LOW (ref 26.6–33.0)
MCHC: 31.7 g/dL (ref 31.5–35.7)
MCV: 83 fL (ref 79–97)
Monocytes Absolute: 0.6 10*3/uL (ref 0.1–0.9)
Monocytes: 6 %
Neutrophils Absolute: 6.2 10*3/uL (ref 1.4–7.0)
Neutrophils: 59 %
Platelets: 360 10*3/uL (ref 150–450)
RBC: 5.04 x10E6/uL (ref 3.77–5.28)
RDW: 15.7 % — ABNORMAL HIGH (ref 11.7–15.4)
WBC: 10.4 10*3/uL (ref 3.4–10.8)

## 2020-02-20 LAB — COMP. METABOLIC PANEL (12)
AST: 14 IU/L (ref 0–40)
Albumin/Globulin Ratio: 1.5 (ref 1.2–2.2)
Albumin: 4.3 g/dL (ref 3.8–4.8)
Alkaline Phosphatase: 116 IU/L (ref 39–117)
BUN/Creatinine Ratio: 20 (ref 12–28)
BUN: 16 mg/dL (ref 8–27)
Bilirubin Total: 0.3 mg/dL (ref 0.0–1.2)
Calcium: 9.9 mg/dL (ref 8.7–10.3)
Chloride: 103 mmol/L (ref 96–106)
Creatinine, Ser: 0.79 mg/dL (ref 0.57–1.00)
GFR calc Af Amer: 91 mL/min/{1.73_m2} (ref >59)
GFR calc non Af Amer: 79 mL/min/{1.73_m2} (ref >59)
Globulin, Total: 2.8 g/dL (ref 1.5–4.5)
Glucose: 173 mg/dL — ABNORMAL HIGH (ref 65–99)
Potassium: 4.4 mmol/L (ref 3.5–5.2)
Sodium: 143 mmol/L (ref 134–144)
Total Protein: 7.1 g/dL (ref 6.0–8.5)

## 2020-02-20 LAB — CARDIOVASCULAR RISK ASSESSMENT

## 2020-02-20 LAB — HEMOGLOBIN A1C
Est. average glucose Bld gHb Est-mCnc: 174 mg/dL
Hgb A1c MFr Bld: 7.7 % — ABNORMAL HIGH (ref 4.8–5.6)

## 2020-02-20 LAB — LIPID PANEL
Chol/HDL Ratio: 3.6 ratio (ref 0.0–4.4)
Cholesterol, Total: 172 mg/dL (ref 100–199)
HDL: 48 mg/dL (ref >39)
LDL Chol Calc (NIH): 91 mg/dL (ref 0–99)
Triglycerides: 197 mg/dL — ABNORMAL HIGH (ref 0–149)
VLDL Cholesterol Cal: 33 mg/dL (ref 5–40)

## 2020-02-26 ENCOUNTER — Other Ambulatory Visit: Payer: Self-pay

## 2020-02-26 MED ORDER — BISOPROLOL-HYDROCHLOROTHIAZIDE 5-6.25 MG PO TABS: 1.0000 | ORAL_TABLET | Freq: Every day | ORAL | 0 refills | Status: DC

## 2020-03-07 ENCOUNTER — Telehealth: Payer: Self-pay

## 2020-03-07 NOTE — Telephone Encounter (Signed)
Returned call to patient after she left a voicemail in regards to her labs in Feb. Left detailed message advised patient to call if she has questions.

## 2020-04-12 ENCOUNTER — Other Ambulatory Visit: Payer: Self-pay

## 2020-04-12 MED ORDER — SYNJARDY XR 25-1000 MG PO TB24: 1.0000 | ORAL_TABLET | Freq: Every day | ORAL | 2 refills | Status: DC

## 2020-04-12 MED ORDER — DULAGLUTIDE 3 MG/0.5ML ~~LOC~~ SOAJ: 3.0000 mg | SUBCUTANEOUS | 2 refills | Status: DC

## 2020-05-04 ENCOUNTER — Other Ambulatory Visit: Payer: Self-pay | Admitting: Family Medicine

## 2020-05-07 ENCOUNTER — Other Ambulatory Visit: Payer: Self-pay

## 2020-05-07 ENCOUNTER — Ambulatory Visit: Payer: Commercial Managed Care - PPO | Admitting: Legal Medicine

## 2020-05-07 ENCOUNTER — Encounter: Payer: Self-pay | Admitting: Legal Medicine

## 2020-05-07 VITALS — BP 124/76 | HR 83 | Temp 97.5°F | Resp 18 | Ht 63.0 in | Wt 155.0 lb

## 2020-05-07 DIAGNOSIS — M545 Low back pain, unspecified: Secondary | ICD-10-CM

## 2020-05-07 DIAGNOSIS — M5442 Lumbago with sciatica, left side: Secondary | ICD-10-CM | POA: Diagnosis not present

## 2020-05-07 DIAGNOSIS — M5440 Lumbago with sciatica, unspecified side: Secondary | ICD-10-CM | POA: Insufficient documentation

## 2020-05-07 HISTORY — DX: Lumbago with sciatica, unspecified side: M54.40

## 2020-05-07 MED ORDER — TRAMADOL HCL 50 MG PO TABS: 50.0000 mg | ORAL_TABLET | Freq: Three times a day (TID) | ORAL | 0 refills | Status: AC | PRN

## 2020-05-07 MED ORDER — CYCLOBENZAPRINE HCL 10 MG PO TABS: 10.0000 mg | ORAL_TABLET | Freq: Three times a day (TID) | ORAL | 0 refills | Status: DC | PRN

## 2020-05-07 NOTE — Patient Instructions (Signed)

## 2020-05-07 NOTE — Progress Notes (Signed)
Established Patient Office Visit  Subjective:  Patient ID: Rachel Hensley, female    DOB: 1956-06-09  Age: 64 y.o. MRN: [id number]  CC:  Chief Complaint  Patient presents with  . Hip Pain    left side    HPI [name] presents for back pain for 2 weeks on left side.  It radiates to leg and buttock.  It is worse sitting down, an pain medial thigh.  Worse with walking.  She sat in hospital for long time 2 weeks ago.  Past Medical History:  Diagnosis Date  . Mixed hyperlipidemia   . Nontoxic multinodular goiter   . Other vitamin B12 deficiency anemias   . Solitary pulmonary nodule   . Type 2 diabetes mellitus with other specified complication [facility])     Past Surgical History:  Procedure Laterality Date  . ABDOMINAL HYSTERECTOMY  06/1993   partial; still has both ovaries  . HYSTEROTOMY     partial    Family History  Problem Relation Age of Onset  . Eczema Grandchild   . Atopy Grandchild   . Asthma Son        childhood asthma  . Coronary artery disease Mother   . Glaucoma Mother   . Colon cancer Father   . Kidney cancer Brother   . Diabetes type II Brother   . Allergic rhinitis Neg Hx   . Angioedema Neg Hx   . Immunodeficiency Neg Hx   . Urticaria Neg Hx     Social History   Socioeconomic History  . Marital status: Married    Spouse name: Not on file  . Number of children: 3  . Years of education: Not on file  . Highest education level: Not on file  Occupational History  . Occupation: Geographical information systems officer  Tobacco Use  . Smoking status: Never Smoker  . Smokeless tobacco: Never Used  Substance and Sexual Activity  . Alcohol use: Never  . Drug use: Never  . Sexual activity: Not on file  Other Topics Concern  . Not on file  Social History Narrative  . Not on file   Social Determinants of Health   Financial Resource Strain:   . Difficulty of Paying Living Expenses:   Food Insecurity:   . Worried About Programme researcher, broadcasting/film/video in the Last Year:   . Garment/textile technologist in the Last Year:   Transportation Needs:   . Freight forwarder (Medical):   Marland Kitchen Lack of Transportation (Non-Medical):   Physical Activity:   . Days of Exercise per Week:   . Minutes of Exercise per Session:   Stress:   . Feeling of Stress :   Social Connections:   . Frequency of Communication with Friends and Family:   . Frequency of Social Gatherings with Friends and Family:   . Attends Religious Services:   . Active Member of Clubs or Organizations:   . Attends Banker Meetings:   Marland Kitchen Marital Status:   Intimate Partner Violence:   . Fear of Current or Ex-Partner:   . Emotionally Abused:   Marland Kitchen Physically Abused:   . Sexually Abused:     Outpatient Medications Prior to Visit  Medication Sig Dispense Refill  . atorvastatin (LIPITOR) 20 MG tablet Take 20 mg by mouth daily.    . bisoprolol-hydrochlorothiazide (ZIAC) 5-6.25 MG tablet Take 1 tablet by mouth daily. 90 tablet 0  . Dermatological Products, Misc. [facility]) lotion APPLY TO THE AFFECTED AREA(S) ON ON THE  LEG TWICE DAILY    . Dulaglutide 3 MG/0.5ML SOPN Inject 3 mg into the skin once a week. 12 pen 2  . Empagliflozin-metFORMIN HCl ER (SYNJARDY XR) 25-1000 MG TB24 Take 1 tablet by mouth daily. 90 tablet 2  . EPINEPHrine 0.3 mg/0.3 mL IJ SOAJ injection Inject 0.3 mg into the muscle once.    Marland Kitchen glimepiride (AMARYL) 4 MG tablet TAKE 1 TABLET TWICE A DAY 180 tablet 3  . omeprazole (PRILOSEC) 20 MG capsule Take 20 mg by mouth 2 (two) times daily.    Marland Kitchen triamcinolone cream (KENALOG) 0.1 % Apply 1 application topically 2 (two) times daily.     No facility-administered medications prior to visit.    Allergies  Allergen Reactions  . Actos [Pioglitazone] Other (See Comments)    Weight gain/ edema  . Lisinopril Other (See Comments)    angioedema  . Prednisone Swelling    ROS Review of Systems  Constitutional: Negative.   HENT: Negative.   Eyes: Negative.   Respiratory: Negative.   Cardiovascular:  Negative.   Gastrointestinal: Negative.   Genitourinary: Negative.   Musculoskeletal: Positive for arthralgias and back pain.  Skin: Negative.   Neurological: Negative.       Objective:    Physical Exam  Constitutional: She is oriented to person, place, and time. She appears well-developed and well-nourished.  Cardiovascular: Normal rate, regular rhythm, normal heart sounds and intact distal pulses.  Pulmonary/Chest: Effort normal and breath sounds normal.  Musculoskeletal:     Lumbar back: Spasms and tenderness present.     Comments: Pain in lower back and buttock, but she has pain on knee flexion and pain over ishial tuberositiy.  Negative SLR, no neurologic deficits, full rom left hip with our pain.  Neurological: She is alert and oriented to person, place, and time. She has normal reflexes.  Vitals reviewed.   BP 124/76   Pulse 83   Temp (!) 97.5 F (36.4 C)   Resp 18   Ht 5\' 3"  (1.6 m)   Wt 155 lb (70.3 kg)   SpO2 98%   BMI 27.46 kg/m  Wt Readings from Last 3 Encounters:  05/07/20 155 lb (70.3 kg)  02/19/20 154 lb 3.2 oz (69.9 kg)  04/05/18 163 lb 6.4 oz (74.1 kg)     Health Maintenance Due  Topic Date Due  . Hepatitis C Screening  Never done  . FOOT EXAM  Never done  . HIV Screening  Never done  . COVID-19 Vaccine (1) Never done  . TETANUS/TDAP  Never done  . PAP SMEAR-Modifier  12/29/1995  . DEXA SCAN  Never done  . OPHTHALMOLOGY EXAM  12/29/2019    There are no preventive care reminders to display for this patient.  No results found for: TSH Lab Results  Component Value Date   WBC 10.4 02/19/2020   HGB 13.3 02/19/2020   HCT 41.9 02/19/2020   MCV 83 02/19/2020   PLT 360 02/19/2020   Lab Results  Component Value Date   NA 143 02/19/2020   K 4.4 02/19/2020   CO2 25 04/05/2018   GLUCOSE 173 (H) 02/19/2020   BUN 16 02/19/2020   CREATININE 0.79 02/19/2020   BILITOT 0.3 02/19/2020   ALKPHOS 116 02/19/2020   AST 14 02/19/2020   ALT 22  04/05/2018   PROT 7.1 02/19/2020   ALBUMIN 4.3 02/19/2020   CALCIUM 9.9 02/19/2020   Lab Results  Component Value Date   CHOL 172 02/19/2020   Lab Results  Component Value Date   HDL 48 02/19/2020   Lab Results  Component Value Date   LDLCALC 91 02/19/2020   Lab Results  Component Value Date   TRIG 197 (H) 02/19/2020   Lab Results  Component Value Date   CHOLHDL 3.6 02/19/2020   Lab Results  Component Value Date   HGBA1C 7.7 (H) 02/19/2020      Assessment & Plan:   Problem List Items Addressed This Visit      Other   Left low back pain - Primary    Patient is having back pain started in left back and radiating to medial thigh.  Some radiation to upper thigh laterally.  She is tender of ischial area and needs x-rays of lumbar spine and left hip.        Relevant Medications   traMADol (ULTRAM) 50 MG tablet   cyclobenzaprine (FLEXERIL) 10 MG tablet   Other Relevant Orders   DG Lumbar Spine Complete   DG Hip Unilat W OR W/O Pelvis Min 4 Views Left      Meds ordered this encounter  Medications  . traMADol (ULTRAM) 50 MG tablet    Sig: Take 1 tablet (50 mg total) by mouth every 8 (eight) hours as needed for up to 5 days.    Dispense:  15 tablet    Refill:  0  . cyclobenzaprine (FLEXERIL) 10 MG tablet    Sig: Take 1 tablet (10 mg total) by mouth 3 (three) times daily as needed for muscle spasms.    Dispense:  30 tablet    Refill:  0    Follow-up: Return in about 2 weeks (around 05/21/2020) for for back.    Reinaldo Meeker, MD

## 2020-05-07 NOTE — Assessment & Plan Note (Signed)
Patient is having back pain started in left back and radiating to medial thigh.  Some radiation to upper thigh laterally.  She is tender of ischial area and needs x-rays of lumbar spine and left hip.

## 2020-05-08 ENCOUNTER — Telehealth: Payer: Self-pay

## 2020-05-08 ENCOUNTER — Other Ambulatory Visit: Payer: Self-pay

## 2020-05-08 DIAGNOSIS — M5442 Lumbago with sciatica, left side: Secondary | ICD-10-CM

## 2020-05-08 NOTE — Telephone Encounter (Signed)
I called patient regarding to Xray and I left message on voicemail to call us back.

## 2020-05-09 ENCOUNTER — Telehealth: Payer: Self-pay

## 2020-05-09 NOTE — Telephone Encounter (Signed)
Patient was informed about the xray results of her hip and back. She did not want to be refer to Back doctor yet. She will let us know if she needs it.

## 2020-05-16 NOTE — Progress Notes (Signed)
Subjective:  Patient ID: Rachel Hensley, female    DOB: [birth year]  Age: 64 y.o. MRN: [id number]  Chief Complaint  Patient presents with  . Hyperlipidemia  . Hypertension  . Diabetes  . Gastroesophageal Reflux   HPI: Patient is a 64 year old white female who presents for follow-up of hypertension, hyperlipidemia, GERD, anemia, and diabetes.  She has been a diabetic since 2006.  She checks her sugars approximately twice a week.  Sugars run 120-170.  The patient does try to eat healthy.  Exercise has been decreased due to back pain and due to caring for her mother.   Patient doe have some polydipsia, but not polyphagia or polyuria.  Denies neuropathy.  Denies blurry vision.  She  checks her feet daily.  Her last eye exam was January/2021 and showed no diabetic retinopathy ([place] ophthomology.)  Patient's current medications include Synjardy XR 79/3903 once daily, Trulicity 3 mg once weekly, and glimepiride 4 mg 1 twice daily.   Hypetension is treated with bisoprolol/HCT once daily.   For her hyperlipidemia she takes atorvastatin 20 mg once daily at night.  GERD is well controlled on omeprazole 20 mg 1 twice daily. Sometimes aleve upsets her stomach.  Patient had a acute flare up of her chronic lumbar back pain and was seen by Dr. Henrene Pastor. She had xrays of lumbar spine with DDD and Left hip Mild OA. Taking flexeril, tramadol, and otc aleve 3 twice a day. None of medicines have been taken on a regular visit. Improves with medicines, but comes back. No radicular pain. Patient does not have time to go to Physical Therapy due to caring for invalid mother who is requiring 24 hour care by she and her sisters.   Past Medical History:  Diagnosis Date  . Mixed hyperlipidemia   . Nontoxic multinodular goiter   . Other vitamin B12 deficiency anemias   . Solitary pulmonary nodule   . Type 2 diabetes mellitus with other specified complication [facility])    Past Surgical History:  Procedure Laterality  Date  . ABDOMINAL HYSTERECTOMY  06/1993   partial; still has both ovaries  . HYSTEROTOMY     partial    Family History  Problem Relation Age of Onset  . Eczema Grandchild   . Atopy Grandchild   . Asthma Son        childhood asthma  . Coronary artery disease Mother   . Glaucoma Mother   . Colon cancer Father   . Kidney cancer Brother   . Diabetes type II Brother   . Allergic rhinitis Neg Hx   . Angioedema Neg Hx   . Immunodeficiency Neg Hx   . Urticaria Neg Hx    Social History   Socioeconomic History  . Marital status: Married    Spouse name: Not on file  . Number of children: 3  . Years of education: Not on file  . Highest education level: Not on file  Occupational History  . Occupation: Insurance account manager  Tobacco Use  . Smoking status: Never Smoker  . Smokeless tobacco: Never Used  Substance and Sexual Activity  . Alcohol use: Never  . Drug use: Never  . Sexual activity: Not on file  Other Topics Concern  . Not on file  Social History Narrative  . Not on file   Social Determinants of Health   Financial Resource Strain:   . Difficulty of Paying Living Expenses:   Food Insecurity:   . Worried About Charity fundraiser in the  Last Year:   . [place] in the Last Year:   Transportation Needs:   . Film/video editor (Medical):   Marland Kitchen Lack of Transportation (Non-Medical):   Physical Activity:   . Days of Exercise per Week:   . Minutes of Exercise per Session:   Stress:   . Feeling of Stress :   Social Connections:   . Frequency of Communication with Friends and Family:   . Frequency of Social Gatherings with Friends and Family:   . Attends Religious Services:   . Active Member of Clubs or Organizations:   . Attends Archivist Meetings:   Marland Kitchen Marital Status:     Review of Systems  Constitutional: Positive for fatigue. Negative for chills and fever.  HENT: Negative for congestion, ear pain, rhinorrhea and sore throat.   Respiratory: Negative for  cough and shortness of breath.   Cardiovascular: Negative for chest pain, palpitations and leg swelling.  Gastrointestinal: Positive for abdominal pain and constipation. Negative for diarrhea, nausea and vomiting.  Genitourinary: Negative for dysuria and urgency.  Musculoskeletal: Positive for back pain. Negative for myalgias.  Neurological: Positive for light-headedness. Negative for dizziness, weakness and headaches.  Psychiatric/Behavioral: Negative for dysphoric mood. The patient is not nervous/anxious.    Last Monday - pt went to the urgent care for constipation and bloody mucous after a bm. No tears or hemorrhoids found by provider at urgent care. Patient has not had a colonoscopy, but had cologuard kit at home that has not been done.   Objective:  BP 110/60   Pulse 88   Temp (!) 96.9 F (36.1 C)   Resp 16   Ht _0  (1.6 m)   Wt 151 lb 9.6 oz (68.8 kg)   BMI 26.85 kg/m   BP/Weight 05/20/2020 05/07/2020 0/53/9767  Systolic BP 341 937 902  Diastolic BP 60 76 76  Wt. (Lbs) 151.6 155 154.2  BMI 26.85 27.46 27.32    Physical Exam Vitals reviewed.  Constitutional:      Appearance: Normal appearance. She is normal weight.  Cardiovascular:     Rate and Rhythm: Normal rate and regular rhythm.     Pulses: Normal pulses.     Heart sounds: Normal heart sounds.  Pulmonary:     Effort: Pulmonary effort is normal. No respiratory distress.     Breath sounds: Normal breath sounds.  Abdominal:     General: Abdomen is flat. Bowel sounds are normal.     Palpations: Abdomen is soft.     Tenderness: There is no abdominal tenderness.  Neurological:     Mental Status: She is alert and oriented to person, place, and time.  Psychiatric:        Mood and Affect: Mood normal.        Behavior: Behavior normal.     Diabetic Foot Exam - Simple   No data filed       Lab Results  Component Value Date   WBC 10.4 02/19/2020   HGB 13.3 02/19/2020   HCT 41.9 02/19/2020   PLT 360  02/19/2020   GLUCOSE 173 (H) 02/19/2020   CHOL 172 02/19/2020   TRIG 197 (H) 02/19/2020   HDL 48 02/19/2020   LDLCALC 91 02/19/2020   ALT 22 04/05/2018   AST 14 02/19/2020   NA 143 02/19/2020   K 4.4 02/19/2020   CL 103 02/19/2020   CREATININE 0.79 02/19/2020   BUN 16 02/19/2020   CO2 25 04/05/2018   HGBA1C  7.7 (H) 02/19/2020   MICROALBUR 150 02/19/2020      Assessment & Plan:   1. Essential hypertension, benign Well controlled.  No changes to medicines.  Continue to work on eating a healthy diet and exercise.  Labs drawn today.  - Comprehensive metabolic panel  2. Mixed hyperlipidemia Well controlled.  No changes to medicines.  Continue to work on eating a healthy diet and exercise.  Labs drawn today.  - Lipid panel  3. Diabetic glomerulopathy ([facility]) Control: wait until labs come back Recommend check sugars fasting daily. Recommend check feet daily. Recommend annual eye exams. Medicines: no changes Continue to work on eating a healthy diet and exercise.  Labs drawn today.   - Hemoglobin A1c - CBC with Differential/Platelet  4. Lumbar back pain Continue flexeril, aleve, and tramadol. Recommend work on exercises given by Dr. Henrene Pastor.   5. Primary insomnia Continue flexeril at night to see if this helps sleep.  6. Primary osteoarthritis of left hip Use aleve.  7. Dizziness Checking labs Be sure to drink 64 oz water daily.  8. Colon cancer screening  Refer to Dr. Melina Copa.  Had blood in stool 1-2 weeks ago, but was after constipation.  Follow-up: Return in about 3 months (around 08/20/2020) for fasting.  An After Visit Summary was printed and given to the patient.  Rochel Brome [facility] [phone]

## 2020-05-20 ENCOUNTER — Encounter: Payer: Self-pay | Admitting: Family Medicine

## 2020-05-20 ENCOUNTER — Ambulatory Visit: Payer: Commercial Managed Care - PPO | Admitting: Family Medicine

## 2020-05-20 ENCOUNTER — Other Ambulatory Visit: Payer: Self-pay

## 2020-05-20 VITALS — BP 110/60 | HR 88 | Temp 96.9°F | Resp 16 | Ht 63.0 in | Wt 151.6 lb

## 2020-05-20 DIAGNOSIS — E782 Mixed hyperlipidemia: Secondary | ICD-10-CM

## 2020-05-20 DIAGNOSIS — F5101 Primary insomnia: Secondary | ICD-10-CM

## 2020-05-20 DIAGNOSIS — I10 Essential (primary) hypertension: Secondary | ICD-10-CM | POA: Diagnosis not present

## 2020-05-20 DIAGNOSIS — Z1211 Encounter for screening for malignant neoplasm of colon: Secondary | ICD-10-CM | POA: Insufficient documentation

## 2020-05-20 DIAGNOSIS — M545 Low back pain, unspecified: Secondary | ICD-10-CM

## 2020-05-20 DIAGNOSIS — M1612 Unilateral primary osteoarthritis, left hip: Secondary | ICD-10-CM | POA: Insufficient documentation

## 2020-05-20 DIAGNOSIS — R42 Dizziness and giddiness: Secondary | ICD-10-CM | POA: Insufficient documentation

## 2020-05-20 DIAGNOSIS — E1121 Type 2 diabetes mellitus with diabetic nephropathy: Secondary | ICD-10-CM | POA: Diagnosis not present

## 2020-05-20 HISTORY — DX: Primary insomnia: F51.01

## 2020-05-20 HISTORY — DX: Unilateral primary osteoarthritis, left hip: M16.12

## 2020-05-20 NOTE — Patient Instructions (Addendum)
  1. Essential hypertension, benign Well controlled.  No changes to medicines.  Continue to work on eating a healthy diet and exercise.  Labs drawn today.  - Comprehensive metabolic panel  2. Mixed hyperlipidemia Well controlled.  Continue to work on eating a healthy diet and exercise.   3. Diabetic glomerulopathy (HCC) Control: wait until labs come back Recommend check sugars fasting daily. Recommend check feet daily. Recommend annual eye exams. Continue to work on eating a healthy diet and exercise.   4. Lumbar back pain Continue flexeril, aleve, and tramadol. Recommend work on exercises given by Dr. Marina Goodell.   5. Primary insomnia Continue flexeril at night to see if this helps sleep.  6. Primary osteoarthritis of left hip Use aleve.  7. Dizziness Checking labs Be sure to drink 64 oz water daily.  8. Colon cancer screening  Refer to Dr. Charm Barges.  Had blood in stool 1-2 weeks ago, but was constipated at the time. Recommend colonoscopy.

## 2020-05-21 LAB — CBC WITH DIFFERENTIAL/PLATELET
Basophils Absolute: 0.1 10*3/uL (ref 0.0–0.2)
Basos: 1 %
EOS (ABSOLUTE): 0.9 10*3/uL — ABNORMAL HIGH (ref 0.0–0.4)
Eos: 9 %
Hematocrit: 38 % (ref 34.0–46.6)
Hemoglobin: 12.5 g/dL (ref 11.1–15.9)
Immature Grans (Abs): 0.1 10*3/uL (ref 0.0–0.1)
Immature Granulocytes: 1 %
Lymphocytes Absolute: 3.1 10*3/uL (ref 0.7–3.1)
Lymphs: 31 %
MCH: 26.9 pg (ref 26.6–33.0)
MCHC: 32.9 g/dL (ref 31.5–35.7)
MCV: 82 fL (ref 79–97)
Monocytes Absolute: 0.7 10*3/uL (ref 0.1–0.9)
Monocytes: 8 %
Neutrophils Absolute: 5.1 10*3/uL (ref 1.4–7.0)
Neutrophils: 50 %
Platelets: 333 10*3/uL (ref 150–450)
RBC: 4.64 x10E6/uL (ref 3.77–5.28)
RDW: 15.3 % (ref 11.7–15.4)
WBC: 9.8 10*3/uL (ref 3.4–10.8)

## 2020-05-21 LAB — COMPREHENSIVE METABOLIC PANEL
ALT: 15 IU/L (ref 0–32)
AST: 13 IU/L (ref 0–40)
Albumin/Globulin Ratio: 1.6 (ref 1.2–2.2)
Albumin: 4.1 g/dL (ref 3.8–4.8)
Alkaline Phosphatase: 113 IU/L (ref 48–121)
BUN/Creatinine Ratio: 31 — ABNORMAL HIGH (ref 12–28)
BUN: 25 mg/dL (ref 8–27)
Bilirubin Total: <0.2 mg/dL (ref 0.0–1.2)
CO2: 23 mmol/L (ref 20–29)
Calcium: 9.7 mg/dL (ref 8.7–10.3)
Chloride: 102 mmol/L (ref 96–106)
Creatinine, Ser: 0.8 mg/dL (ref 0.57–1.00)
GFR calc Af Amer: 89 mL/min/{1.73_m2} (ref >59)
GFR calc non Af Amer: 78 mL/min/{1.73_m2} (ref >59)
Globulin, Total: 2.6 g/dL (ref 1.5–4.5)
Glucose: 139 mg/dL — ABNORMAL HIGH (ref 65–99)
Potassium: 4.6 mmol/L (ref 3.5–5.2)
Sodium: 140 mmol/L (ref 134–144)
Total Protein: 6.7 g/dL (ref 6.0–8.5)

## 2020-05-21 LAB — LIPID PANEL
Chol/HDL Ratio: 3.7 ratio (ref 0.0–4.4)
Cholesterol, Total: 142 mg/dL (ref 100–199)
HDL: 38 mg/dL — ABNORMAL LOW (ref >39)
LDL Chol Calc (NIH): 61 mg/dL (ref 0–99)
Triglycerides: 273 mg/dL — ABNORMAL HIGH (ref 0–149)
VLDL Cholesterol Cal: 43 mg/dL — ABNORMAL HIGH (ref 5–40)

## 2020-05-21 LAB — HEMOGLOBIN A1C
Est. average glucose Bld gHb Est-mCnc: 163 mg/dL
Hgb A1c MFr Bld: 7.3 % — ABNORMAL HIGH (ref 4.8–5.6)

## 2020-05-21 LAB — CARDIOVASCULAR RISK ASSESSMENT

## 2020-05-22 ENCOUNTER — Other Ambulatory Visit: Payer: Self-pay

## 2020-05-22 MED ORDER — ATORVASTATIN CALCIUM 20 MG PO TABS: 20.0000 mg | ORAL_TABLET | Freq: Every day | ORAL | 0 refills | Status: DC

## 2020-05-30 ENCOUNTER — Other Ambulatory Visit: Payer: Self-pay

## 2020-05-30 MED ORDER — ATORVASTATIN CALCIUM 20 MG PO TABS: 20.0000 mg | ORAL_TABLET | Freq: Every day | ORAL | 0 refills | Status: DC

## 2020-07-05 ENCOUNTER — Other Ambulatory Visit: Payer: Self-pay

## 2020-07-05 MED ORDER — DULAGLUTIDE 3 MG/0.5ML ~~LOC~~ SOAJ: 3.0000 mg | SUBCUTANEOUS | 2 refills | Status: DC

## 2020-07-05 MED ORDER — EMPAGLIFLOZIN-METFORMIN HCL ER 12.5-1000 MG PO TB24: 12.5000 mg | ORAL_TABLET | Freq: Two times a day (BID) | ORAL | 0 refills | Status: DC

## 2020-08-21 ENCOUNTER — Other Ambulatory Visit: Payer: Self-pay

## 2020-08-21 ENCOUNTER — Encounter: Payer: Self-pay | Admitting: Family Medicine

## 2020-08-21 ENCOUNTER — Ambulatory Visit (INDEPENDENT_AMBULATORY_CARE_PROVIDER_SITE_OTHER): Payer: Commercial Managed Care - PPO | Admitting: Family Medicine

## 2020-08-21 VITALS — BP 128/64 | HR 80 | Temp 97.4°F | Resp 14 | Ht 63.0 in | Wt 147.2 lb

## 2020-08-21 DIAGNOSIS — N958 Other specified menopausal and perimenopausal disorders: Secondary | ICD-10-CM

## 2020-08-21 DIAGNOSIS — K219 Gastro-esophageal reflux disease without esophagitis: Secondary | ICD-10-CM

## 2020-08-21 DIAGNOSIS — E11649 Type 2 diabetes mellitus with hypoglycemia without coma: Secondary | ICD-10-CM | POA: Insufficient documentation

## 2020-08-21 DIAGNOSIS — I10 Essential (primary) hypertension: Secondary | ICD-10-CM | POA: Diagnosis not present

## 2020-08-21 DIAGNOSIS — E1121 Type 2 diabetes mellitus with diabetic nephropathy: Secondary | ICD-10-CM

## 2020-08-21 DIAGNOSIS — Z1231 Encounter for screening mammogram for malignant neoplasm of breast: Secondary | ICD-10-CM

## 2020-08-21 DIAGNOSIS — E782 Mixed hyperlipidemia: Secondary | ICD-10-CM

## 2020-08-21 DIAGNOSIS — S82891A Other fracture of right lower leg, initial encounter for closed fracture: Secondary | ICD-10-CM

## 2020-08-21 HISTORY — DX: Type 2 diabetes mellitus with hypoglycemia without coma: E11.649

## 2020-08-21 LAB — POCT UA - MICROALBUMIN: Microalbumin Ur, POC: 80 mg/L

## 2020-08-21 MED ORDER — BISOPROLOL-HYDROCHLOROTHIAZIDE 5-6.25 MG PO TABS
1.0000 | ORAL_TABLET | Freq: Every day | ORAL | 1 refills | Status: DC
Start: 2020-08-21 — End: 2020-09-20

## 2020-08-21 MED ORDER — MELOXICAM 15 MG PO TABS: 15.0000 mg | ORAL_TABLET | Freq: Every day | ORAL | 0 refills | Status: DC

## 2020-08-21 NOTE — Patient Instructions (Addendum)
For diabetes stop glimeperide  Continue synjardy XR 25/1000 mg once daily.  Continue trulicity at 3 mg once weekly.   For ankle: stop aleve.  Start on meloxicam 15 mg once daily.  Increase omeprazole to 20 mg twice daily while taking a regular anti-inflammatory (meloxicam.) You may also take acetaminophen (Tylenol) as directed on the bottle for breakthrough pain. Continue to wear ankle brace.  Please strongly reconsider getting covid 19 vaccine!  Recommend return for nurse visit in 08/2020 for high dose flu shot.  Thank you, Dr. Tobie Poet   Preventing Hypoglycemia Hypoglycemia occurs when the level of sugar (glucose) in the blood is too low. Hypoglycemia can happen in people who do or do not have diabetes (diabetes mellitus). It can develop quickly, and it can be a medical emergency. For most people with diabetes, a blood glucose level below 70 mg/dL (3.9 mmol/L) is considered hypoglycemia. Glucose is a type of sugar that provides the body's main source of energy. Certain hormones (insulin and glucagon) control the level of glucose in the blood. Insulin lowers blood glucose, and glucagon increases blood glucose. Hypoglycemia can result from having too much insulin in the bloodstream, or from not eating enough food that contains glucose. Your risk for hypoglycemia is higher:  If you take insulin or diabetes medicines to help lower your blood glucose or help your body make more insulin.  If you skip or delay a meal or snack.  If you are ill.  During and after exercise. You can prevent hypoglycemia by working with your health care provider to adjust your meal plan as needed and by taking other precautions. How can hypoglycemia affect me? Mild symptoms Mild hypoglycemia may not cause any symptoms. If you do have symptoms, they may include:  Hunger.  Anxiety.  Sweating and feeling clammy.  Dizziness or feeling light-headed.  Sleepiness.  Nausea.  Increased heart  rate.  Headache.  Blurry vision.  Irritability.  Tingling or numbness around the mouth, lips, or tongue.  A change in coordination.  Restless sleep. If mild hypoglycemia is not recognized and treated, it can quickly become moderate or severe hypoglycemia. Moderate symptoms Moderate hypoglycemia can cause:  Mental confusion and poor judgment.  Behavior changes.  Weakness.  Irregular heartbeat. Severe symptoms Severe hypoglycemia is a medical emergency. It can cause:  Fainting.  Seizures.  Loss of consciousness (coma).  Death. What nutrition changes can be made?  Work with your health care provider or diet and nutrition specialist (dietitian) to make a healthy meal plan that is right for you. Follow your meal plan carefully.  Eat meals at regular times.  If recommended by your health care provider, have snacks between meals.  Donot skip or delay meals or snacks. You can be at risk for hypoglycemia if you are not getting enough carbohydrates. What lifestyle changes can be made?   Work closely with your health care provider to manage your blood glucose. Make sure you know: ? Your goal blood glucose levels. ? How and when to check your blood glucose. ? The symptoms of hypoglycemia. It is important to treat it right away to keep it from becoming severe.  Do not drink alcohol on an empty stomach.  When you are ill, check your blood glucose more often than usual. Follow your sick day plan whenever you cannot eat or drink normally. Make this plan in advance with your health care provider.  Always check your blood glucose before, during, and after exercise. How is this treated? This  condition can often be treated by immediately eating or drinking something that contains sugar, such as:  Fruit juice, 4-6 oz (120-150 mL).  Regular (not diet) soda, 4-6 oz (120-150 mL).  Low-fat milk, 4 oz (120 mL).  Several pieces of hard candy.  Sugar or honey, 1 Tbsp (15  mL). Treating hypoglycemia if you have diabetes If you are alert and able to swallow safely, follow the 15:15 rule:  Take 15 grams of a rapid-acting carbohydrate. Talk with your health care provider about how much you should take.  Rapid-acting options include: ? Glucose pills (take 15 grams). ? 6-8 pieces of hard candy. ? 4-6 oz (120-150 mL) of fruit juice. ? 4-6 oz (120-150 mL) of regular (not diet) soda.  Check your blood glucose 15 minutes after you take the carbohydrate.  If the repeat blood glucose level is still at or below 70 mg/dL (3.9 mmol/L), take 15 grams of a carbohydrate again.  If your blood glucose level does not increase above 70 mg/dL (3.9 mmol/L) after 3 tries, seek emergency medical care.  After your blood glucose level returns to normal, eat a meal or a snack within 1 hour. Treating severe hypoglycemia Severe hypoglycemia is when your blood glucose level is at or below 54 mg/dL (3 mmol/L). Severe hypoglycemia is a medical emergency. Get medical help right away. If you have severe hypoglycemia and you cannot eat or drink, you may need an injection of glucagon. A family member or close friend should learn how to check your blood glucose and how to give you a glucagon injection. Ask your health care provider if you need to have an emergency glucagon injection kit available. Severe hypoglycemia may need to be treated in a hospital. The treatment may include getting glucose through an IV. You may also need treatment for the cause of your hypoglycemia. Where to find more information  American Diabetes Association: www.diabetes.[employer] of Diabetes and Digestive and Kidney Diseases: [employer] Contact a health care provider if:  You have problems keeping your blood glucose in your target range.  You have frequent episodes of hypoglycemia. Get help right away if:  You continue to have hypoglycemia symptoms after eating or drinking something  containing glucose.  Your blood glucose level is at or below 54 mg/dL (3 mmol/L).  You faint.  You have a seizure. These symptoms may represent a serious problem that is an emergency. Do not wait to see if the symptoms will go away. Get medical help right away. Call your local emergency services (911 in the [place] Summary  Know the symptoms of hypoglycemia, and when you are at risk for it (such as during exercise or when you are sick). Check your blood glucose often when you are at risk for hypoglycemia.  Hypoglycemia can develop quickly, and it can be dangerous if it is not treated right away. If you have a history of severe hypoglycemia, make sure you know how to use your glucagon injection kit.  Make sure you know how to treat hypoglycemia. Keep a carbohydrate snack available when you may be at risk for hypoglycemia. This information is not intended to replace advice given to you by your health care provider. Make sure you discuss any questions you have with your health care provider. Document Revised: 04/07/2019 Document Reviewed: 08/11/2017 Elsevier Patient Education  [place].

## 2020-08-21 NOTE — Progress Notes (Signed)
Subjective:  Patient ID: Rachel Hensley, female    DOB: 1956-12-03  Age: 64 y.o. MRN: [id number]  Chief Complaint  Patient presents with  . Hypertension  . Diabetes    HPI Patient is a 64 year old white female who presents for follow-up of hypertension, hyperlipidemia, GERD, anemia, and diabetes. She has been a diabetic since 2006. She checks her sugars once a day. Having low sugars into the 50s every morning. The highest sugar she has gotten is 79.   Patient becomes shaky and cold. The patient is eating healthy. Exercise has been decreased due recent fracture of right ankle.Patient does not have polyphagia, polydipsia or polyuria. Denies neuropathy. Denies blurry vision. She  checks her feet daily. Her last eye exam was January/2021 and showed no diabetic retinopathy ([place] ophthomology.) Patient's current medications include Synjardy XR 25/1000 once daily, Trulicity 3 mg once weekly, and glimepiride 4 mg 1 twice daily.  Hypertension is treated with bisoprolol/HCT once daily.   For her hyperlipidemia she takes atorvastatin 20 mg once daily at night.   GERD is well controlled on omeprazole 20 mg once daily. Sometime increases if has breakthrough ged.  Patient had a fall at the end of June 2021. She fell over a potty chair while taking care of her mother. She fractured right ankle. She went to the ED. In a cast until 1 week ago. Removed cast and has a wrap. Has a lot of pain. Returned to work one week ago. Taking aleve 2 in am and 3 in pm.   Current Outpatient Medications on File Prior to Visit  Medication Sig Dispense Refill  . aspirin 81 MG chewable tablet Chew 81 mg by mouth daily.    Marland Kitchen. atorvastatin (LIPITOR) 20 MG tablet Take 1 tablet (20 mg total) by mouth daily. 90 tablet 0  . cetirizine (ZYRTEC) 10 MG tablet Take 10 mg by mouth daily.    . Dermatological Products, Misc. [facility](EPICERAM) lotion APPLY TO THE AFFECTED AREA(S) ON ON THE LEG TWICE DAILY    . Dulaglutide 3  MG/0.5ML SOPN Inject 0.5 mLs (3 mg total) into the skin once a week. 12 pen 2  . Empagliflozin-metFORMIN HCl ER 12.04-999 MG TB24 Take 12.5-1,000 mg by mouth 2 (two) times daily. 180 tablet 0  . EPINEPHrine 0.3 mg/0.3 mL IJ SOAJ injection Inject 0.3 mg into the muscle once.    Marland Kitchen. glimepiride (AMARYL) 4 MG tablet TAKE 1 TABLET TWICE A DAY 180 tablet 3  . omeprazole (PRILOSEC) 20 MG capsule Take 20 mg by mouth 2 (two) times daily.    Marland Kitchen. triamcinolone cream (KENALOG) 0.1 % Apply 1 application topically 2 (two) times daily.     No current facility-administered medications on file prior to visit.   Past Medical History:  Diagnosis Date  . Mixed hyperlipidemia   . Nontoxic multinodular goiter   . Other vitamin B12 deficiency anemias   . Solitary pulmonary nodule   . Type 2 diabetes mellitus with other specified complication [facility](HCC)    Past Surgical History:  Procedure Laterality Date  . ABDOMINAL HYSTERECTOMY  06/1993   partial; still has both ovaries  . HYSTEROTOMY     partial    Family History  Problem Relation Age of Onset  . Eczema Grandchild   . Atopy Grandchild   . Asthma Son        childhood asthma  . Coronary artery disease Mother   . Glaucoma Mother   . Colon cancer Father   . Kidney cancer  Brother   . Diabetes type II Brother   . Allergic rhinitis Neg Hx   . Angioedema Neg Hx   . Immunodeficiency Neg Hx   . Urticaria Neg Hx    Social History   Socioeconomic History  . Marital status: Married    Spouse name: Not on file  . Number of children: 3  . Years of education: Not on file  . Highest education level: Not on file  Occupational History  . Occupation: Geographical information systems officer  Tobacco Use  . Smoking status: Never Smoker  . Smokeless tobacco: Never Used  Vaping Use  . Vaping Use: Never used  Substance and Sexual Activity  . Alcohol use: Never  . Drug use: Never  . Sexual activity: Not on file  Other Topics Concern  . Not on file  Social History Narrative  . Not on file    Social Determinants of Health   Financial Resource Strain:   . Difficulty of Paying Living Expenses: Not on file  Food Insecurity:   . Worried About Programme researcher, broadcasting/film/video in the Last Year: Not on file  . Ran Out of Food in the Last Year: Not on file  Transportation Needs:   . Lack of Transportation (Medical): Not on file  . Lack of Transportation (Non-Medical): Not on file  Physical Activity:   . Days of Exercise per Week: Not on file  . Minutes of Exercise per Session: Not on file  Stress:   . Feeling of Stress : Not on file  Social Connections:   . Frequency of Communication with Friends and Family: Not on file  . Frequency of Social Gatherings with Friends and Family: Not on file  . Attends Religious Services: Not on file  . Active Member of Clubs or Organizations: Not on file  . Attends Banker Meetings: Not on file  . Marital Status: Not on file    Review of Systems  Constitutional: Positive for fatigue. Negative for chills and fever.  HENT: Negative for congestion, facial swelling, rhinorrhea and sore throat. Ear pain: right earache    Respiratory: Negative for cough and shortness of breath.   Cardiovascular: Negative for chest pain and palpitations.  Gastrointestinal: Positive for constipation (IBS) and diarrhea. Negative for abdominal pain.  Genitourinary: Negative for dysuria and frequency.  Musculoskeletal: Positive for arthralgias (right knee pain ) and joint swelling (right ankle fracture June 26, 2020 ).  Neurological: Negative for dizziness and headaches.  Psychiatric/Behavioral: Negative for dysphoric mood. The patient is nervous/anxious.      Objective:  BP 128/64   Pulse 80   Temp (!) 97.4 F (36.3 C)   Resp 14   Ht 5\' 3"  (1.6 m)   Wt 147 lb 3.2 oz (66.8 kg)   BMI 26.08 kg/m   BP/Weight 08/21/2020 05/20/2020 05/07/2020  Systolic BP 128 110 124  Diastolic BP 64 60 76  Wt. (Lbs) 147.2 151.6 155  BMI 26.08 26.85 27.46    Physical  Exam Vitals reviewed.  Constitutional:      Appearance: Normal appearance. She is normal weight.  HENT:     Right Ear: Tympanic membrane, ear canal and external ear normal.     Left Ear: Tympanic membrane and external ear normal.  Neck:     Vascular: No carotid bruit.  Cardiovascular:     Rate and Rhythm: Normal rate and regular rhythm.     Pulses: Normal pulses.     Heart sounds: Normal heart sounds. No murmur heard.  Pulmonary:     Effort: Pulmonary effort is normal. No respiratory distress.     Breath sounds: Normal breath sounds.  Abdominal:     General: Abdomen is flat. Bowel sounds are normal.     Palpations: Abdomen is soft.     Tenderness: There is no abdominal tenderness.  Musculoskeletal:        General: Swelling (lateral right ankle. tender. ) present.  Skin:    General: Skin is warm.  Neurological:     Mental Status: She is alert and oriented to person, place, and time.  Psychiatric:        Mood and Affect: Mood normal.        Behavior: Behavior normal.     Diabetic Foot Exam - Simple   Simple Foot Form Visual Inspection See comments: Yes Sensation Testing Intact to touch and monofilament testing bilaterally: Yes Pulse Check Posterior Tibialis and Dorsalis pulse intact bilaterally: Yes Comments Rt ankle laterally is edematous. Mildly tender. Due to recent ankle fracture.      Lab Results  Component Value Date   WBC 9.8 05/20/2020   HGB 12.5 05/20/2020   HCT 38.0 05/20/2020   PLT 333 05/20/2020   GLUCOSE 139 (H) 05/20/2020   CHOL 142 05/20/2020   TRIG 273 (H) 05/20/2020   HDL 38 (L) 05/20/2020   LDLCALC 61 05/20/2020   ALT 15 05/20/2020   AST 13 05/20/2020   NA 140 05/20/2020   K 4.6 05/20/2020   CL 102 05/20/2020   CREATININE 0.80 05/20/2020   BUN 25 05/20/2020   CO2 23 05/20/2020   HGBA1C 7.3 (H) 05/20/2020   MICROALBUR 80 08/21/2020      Assessment & Plan:   1. Essential hypertension, benign Well controlled.  No changes to  medicines.  Continue to work on eating a healthy diet and exercise.  Labs drawn today.  - CBC with Differential/Platelet - Comprehensive metabolic panel  2. Diabetic glomerulopathy (HCC) Control: good. Recommend check sugars fasting daily. Recommend check feet daily. Recommend annual eye exams. Medicines:  stop glimeperide  Continue synjardy XR 25/1000 mg once daily.  Continue trulicity at 3 mg once weekly.  Continue to work on eating a healthy diet and exercise.  Labs drawn today.   - Hemoglobin A1c - POCT UA - Microalbumin 80 intolerant to ace inhibitors.  3. GERD without esophagitis Increase omeprazole to 20 mg twice daily while taking a regular anti-inflammatory (meloxicam.)  4. Mixed hyperlipidemia Well controlled.  No changes to medicines.  Continue to work on eating a healthy diet and exercise.  Labs drawn today.  - Lipid panel  5. Hypoglycemia due to type 2 diabetes mellitus (HCC) Hold glimeperide. - Hemoglobin A1c  6. Other specified menopausal and perimenopausal disorders - DG Bone Density; Future  7. Encounter for screening mammogram for breast cancer - MM Digital Screening; Future  8. Closed fracture of right ankle, initial encounter  For ankle: stop aleve.  Start on meloxicam 15 mg once daily.  You may also take acetaminophen (Tylenol) as directed on the bottle for breakthrough pain. Continue to wear ankle brace.  Please strongly reconsider getting covid 19 vaccine!  Recommend return for nurse visit in 08/2020 for high dose flu shot.   Meds ordered this encounter  Medications  . bisoprolol-hydrochlorothiazide (ZIAC) 5-6.25 MG tablet    Sig: Take 1 tablet by mouth daily.    Dispense:  90 tablet    Refill:  1  . meloxicam (MOBIC) 15 MG tablet  Sig: Take 1 tablet (15 mg total) by mouth daily.    Dispense:  30 tablet    Refill:  0    Orders Placed This Encounter  Procedures  . MM Digital Screening  . DG Bone Density  . CBC with  Differential/Platelet  . Comprehensive metabolic panel  . Hemoglobin A1c  . Lipid panel  . POCT UA - Microalbumin     Follow-up: Return in about 3 months (around 11/21/2020) for fasting.  An After Visit Summary was printed and given to the patient.  Blane Ohara [facility] [phone]

## 2020-08-22 LAB — COMPREHENSIVE METABOLIC PANEL
ALT: 13 IU/L (ref 0–32)
AST: 14 IU/L (ref 0–40)
Albumin/Globulin Ratio: 1.4 (ref 1.2–2.2)
Albumin: 4.4 g/dL (ref 3.8–4.8)
Alkaline Phosphatase: 125 IU/L — ABNORMAL HIGH (ref 48–121)
BUN/Creatinine Ratio: 18 (ref 12–28)
BUN: 17 mg/dL (ref 8–27)
Bilirubin Total: 0.4 mg/dL (ref 0.0–1.2)
CO2: 25 mmol/L (ref 20–29)
Calcium: 9.6 mg/dL (ref 8.7–10.3)
Chloride: 100 mmol/L (ref 96–106)
Creatinine, Ser: 0.92 mg/dL (ref 0.57–1.00)
GFR calc Af Amer: 75 mL/min/{1.73_m2} (ref >59)
GFR calc non Af Amer: 65 mL/min/{1.73_m2} (ref >59)
Globulin, Total: 3.1 g/dL (ref 1.5–4.5)
Glucose: 91 mg/dL (ref 65–99)
Potassium: 4.5 mmol/L (ref 3.5–5.2)
Sodium: 142 mmol/L (ref 134–144)
Total Protein: 7.5 g/dL (ref 6.0–8.5)

## 2020-08-22 LAB — CBC WITH DIFFERENTIAL/PLATELET
Basophils Absolute: 0 10*3/uL (ref 0.0–0.2)
Basos: 0 %
EOS (ABSOLUTE): 0.4 10*3/uL (ref 0.0–0.4)
Eos: 3 %
Hematocrit: 41 % (ref 34.0–46.6)
Hemoglobin: 12.8 g/dL (ref 11.1–15.9)
Immature Grans (Abs): 0 10*3/uL (ref 0.0–0.1)
Immature Granulocytes: 0 %
Lymphocytes Absolute: 3.4 10*3/uL — ABNORMAL HIGH (ref 0.7–3.1)
Lymphs: 29 %
MCH: 26.3 pg — ABNORMAL LOW (ref 26.6–33.0)
MCHC: 31.2 g/dL — ABNORMAL LOW (ref 31.5–35.7)
MCV: 84 fL (ref 79–97)
Monocytes Absolute: 0.8 10*3/uL (ref 0.1–0.9)
Monocytes: 7 %
Neutrophils Absolute: 6.9 10*3/uL (ref 1.4–7.0)
Neutrophils: 61 %
Platelets: 372 10*3/uL (ref 150–450)
RBC: 4.86 x10E6/uL (ref 3.77–5.28)
RDW: 15.3 % (ref 11.7–15.4)
WBC: 11.5 10*3/uL — ABNORMAL HIGH (ref 3.4–10.8)

## 2020-08-22 LAB — LIPID PANEL
Chol/HDL Ratio: 3.1 ratio (ref 0.0–4.4)
Cholesterol, Total: 133 mg/dL (ref 100–199)
HDL: 43 mg/dL (ref >39)
LDL Chol Calc (NIH): 68 mg/dL (ref 0–99)
Triglycerides: 124 mg/dL (ref 0–149)
VLDL Cholesterol Cal: 22 mg/dL (ref 5–40)

## 2020-08-22 LAB — CARDIOVASCULAR RISK ASSESSMENT

## 2020-08-22 LAB — HEMOGLOBIN A1C
Est. average glucose Bld gHb Est-mCnc: 123 mg/dL
Hgb A1c MFr Bld: 5.9 % — ABNORMAL HIGH (ref 4.8–5.6)

## 2020-08-28 ENCOUNTER — Other Ambulatory Visit: Payer: Self-pay | Admitting: Family Medicine

## 2020-09-20 ENCOUNTER — Ambulatory Visit (INDEPENDENT_AMBULATORY_CARE_PROVIDER_SITE_OTHER): Payer: Commercial Managed Care - PPO | Admitting: Family Medicine

## 2020-09-20 ENCOUNTER — Encounter: Payer: Self-pay | Admitting: Family Medicine

## 2020-09-20 ENCOUNTER — Other Ambulatory Visit: Payer: Self-pay

## 2020-09-20 VITALS — BP 124/76 | HR 84 | Temp 97.5°F | Resp 16 | Ht 62.5 in | Wt 142.0 lb

## 2020-09-20 DIAGNOSIS — M25561 Pain in right knee: Secondary | ICD-10-CM

## 2020-09-20 MED ORDER — GABAPENTIN 100 MG PO CAPS: 100.0000 mg | ORAL_CAPSULE | Freq: Three times a day (TID) | ORAL | 0 refills | Status: DC

## 2020-09-20 MED ORDER — MELOXICAM 15 MG PO TABS
15.0000 mg | ORAL_TABLET | Freq: Every day | ORAL | 0 refills | Status: DC
Start: 2020-09-20 — End: 2021-03-06

## 2020-09-20 MED ORDER — BISOPROLOL-HYDROCHLOROTHIAZIDE 5-6.25 MG PO TABS
1.0000 | ORAL_TABLET | Freq: Every day | ORAL | 1 refills | Status: DC
Start: 2020-09-20 — End: 2021-03-03

## 2020-09-20 MED ORDER — DULAGLUTIDE 3 MG/0.5ML ~~LOC~~ SOAJ
3.0000 mg | SUBCUTANEOUS | 0 refills | Status: DC
Start: 2020-09-20 — End: 2020-11-28

## 2020-09-20 NOTE — Patient Instructions (Signed)
Patient was sent to [facility] to see Dr. Osie Bond today at 09:30 a.m. who agreed to do a knee xray (right) and will determine if she needs a MRI and to discuss rather she needs any other additional treatment.

## 2020-09-20 NOTE — Progress Notes (Signed)
Acute Office Visit  Subjective:    Patient ID: Rachel Hensley, female    DOB: May 26, 1956, 64 y.o.   MRN: [id number]  Chief Complaint  Patient presents with  . right knee pain    HPI Patient is in today for rt knee pain. Pain down leg.  Patient was seen in mid September by orthopedics.  Patient aspirated her knee and injected it with steroids.  She called last night while I was on call and I did tell her she could come in this morning I would take a look.  Her pain had improved but then significantly worsened.  She was under the impression she needed a new referral to go back to orthopedics and as I was not sure I had her come in.  Past Medical History:  Diagnosis Date  . Mixed hyperlipidemia   . Nontoxic multinodular goiter   . Other vitamin B12 deficiency anemias   . Solitary pulmonary nodule   . Type 2 diabetes mellitus with other specified complication [facility])     Past Surgical History:  Procedure Laterality Date  . ABDOMINAL HYSTERECTOMY  06/1993   partial; still has both ovaries  . HYSTEROTOMY     partial    Family History  Problem Relation Age of Onset  . Eczema Grandchild   . Atopy Grandchild   . Asthma Son        childhood asthma  . Coronary artery disease Mother   . Glaucoma Mother   . Colon cancer Father   . Kidney cancer Brother   . Diabetes type II Brother   . Allergic rhinitis Neg Hx   . Angioedema Neg Hx   . Immunodeficiency Neg Hx   . Urticaria Neg Hx     Social History   Socioeconomic History  . Marital status: Married    Spouse name: Not on file  . Number of children: 3  . Years of education: Not on file  . Highest education level: Not on file  Occupational History  . Occupation: Geographical information systems officer  Tobacco Use  . Smoking status: Never Smoker  . Smokeless tobacco: Never Used  Vaping Use  . Vaping Use: Never used  Substance and Sexual Activity  . Alcohol use: Never  . Drug use: Never  . Sexual activity: Not on file  Other Topics Concern  . Not  on file  Social History Narrative  . Not on file   Social Determinants of Health   Financial Resource Strain:   . Difficulty of Paying Living Expenses: Not on file  Food Insecurity:   . Worried About Programme researcher, broadcasting/film/video in the Last Year: Not on file  . Ran Out of Food in the Last Year: Not on file  Transportation Needs:   . Lack of Transportation (Medical): Not on file  . Lack of Transportation (Non-Medical): Not on file  Physical Activity:   . Days of Exercise per Week: Not on file  . Minutes of Exercise per Session: Not on file  Stress:   . Feeling of Stress : Not on file  Social Connections:   . Frequency of Communication with Friends and Family: Not on file  . Frequency of Social Gatherings with Friends and Family: Not on file  . Attends Religious Services: Not on file  . Active Member of Clubs or Organizations: Not on file  . Attends Banker Meetings: Not on file  . Marital Status: Not on file  Intimate Partner Violence:   . Fear  of Current or Ex-Partner: Not on file  . Emotionally Abused: Not on file  . Physically Abused: Not on file  . Sexually Abused: Not on file    Outpatient Medications Prior to Visit  Medication Sig Dispense Refill  . aspirin 81 MG chewable tablet Chew 81 mg by mouth daily.    Marland Kitchen atorvastatin (LIPITOR) 20 MG tablet TAKE 1 TABLET DAILY 90 tablet 3  . cetirizine (ZYRTEC) 10 MG tablet Take 10 mg by mouth daily.    . Dermatological Products, Misc. [facility]) lotion APPLY TO THE AFFECTED AREA(S) ON ON THE LEG TWICE DAILY    . Empagliflozin-metFORMIN HCl ER 12.04-999 MG TB24 Take 12.5-1,000 mg by mouth 2 (two) times daily. 180 tablet 0  . EPINEPHrine 0.3 mg/0.3 mL IJ SOAJ injection Inject 0.3 mg into the muscle once.    Marland Kitchen omeprazole (PRILOSEC) 20 MG capsule Take 20 mg by mouth 2 (two) times daily.    Marland Kitchen triamcinolone cream (KENALOG) 0.1 % Apply 1 application topically 2 (two) times daily.    . bisoprolol-hydrochlorothiazide (ZIAC) 5-6.25 MG  tablet Take 1 tablet by mouth daily. 90 tablet 1  . Dulaglutide 3 MG/0.5ML SOPN Inject 0.5 mLs (3 mg total) into the skin once a week. 12 pen 2  . glimepiride (AMARYL) 4 MG tablet TAKE 1 TABLET TWICE A DAY 180 tablet 3  . meloxicam (MOBIC) 15 MG tablet Take 1 tablet (15 mg total) by mouth daily. 30 tablet 0   No facility-administered medications prior to visit.    Allergies  Allergen Reactions  . Actos [Pioglitazone] Other (See Comments)    Weight gain/ edema  . Lisinopril Other (See Comments)    angioedema  . Prednisone Swelling    Review of Systems  Constitutional: Negative for chills and fever.  HENT: Negative for congestion, ear pain and sore throat.   Respiratory: Negative for cough and shortness of breath.   Cardiovascular: Negative for chest pain.  Musculoskeletal: Positive for arthralgias and gait problem.       Objective:    Physical Exam Vitals reviewed.  Constitutional:      Appearance: Normal appearance. She is normal weight.  Cardiovascular:     Rate and Rhythm: Normal rate and regular rhythm.     Heart sounds: Normal heart sounds.  Pulmonary:     Effort: Pulmonary effort is normal. No respiratory distress.     Breath sounds: Normal breath sounds.  Musculoskeletal:     Comments: Patient is resistant to have any fully extend her right knee. No effusion noted. No erythema.  Positive patellar apprehension. Positive McMurray's.  Negative ligament laxity.  Negative anterior drawer movement.   Neurological:     Mental Status: She is alert and oriented to person, place, and time.  Psychiatric:        Mood and Affect: Mood normal.        Behavior: Behavior normal.     BP 124/76   Pulse 84   Temp (!) 97.5 F (36.4 C)   Resp 16   Ht 5' 2.5" (1.588 m)   Wt 142 lb (64.4 kg)   BMI 25.56 kg/m  Wt Readings from Last 3 Encounters:  09/20/20 142 lb (64.4 kg)  08/21/20 147 lb 3.2 oz (66.8 kg)  05/20/20 151 lb 9.6 oz (68.8 kg)    Health Maintenance Due    Topic Date Due  . Hepatitis C Screening  Never done  . FOOT EXAM  Never done  . COVID-19 Vaccine (1) Never done  .  TETANUS/TDAP  Never done  . DEXA SCAN  Never done  . OPHTHALMOLOGY EXAM  12/29/2019  . INFLUENZA VACCINE  07/28/2020    There are no preventive care reminders to display for this patient.   No results found for: TSH Lab Results  Component Value Date   WBC 11.5 (H) 08/21/2020   HGB 12.8 08/21/2020   HCT 41.0 08/21/2020   MCV 84 08/21/2020   PLT 372 08/21/2020   Lab Results  Component Value Date   NA 142 08/21/2020   K 4.5 08/21/2020   CO2 25 08/21/2020   GLUCOSE 91 08/21/2020   BUN 17 08/21/2020   CREATININE 0.92 08/21/2020   BILITOT 0.4 08/21/2020   ALKPHOS 125 (H) 08/21/2020   AST 14 08/21/2020   ALT 13 08/21/2020   PROT 7.5 08/21/2020   ALBUMIN 4.4 08/21/2020   CALCIUM 9.6 08/21/2020   Lab Results  Component Value Date   CHOL 133 08/21/2020   Lab Results  Component Value Date   HDL 43 08/21/2020   Lab Results  Component Value Date   LDLCALC 68 08/21/2020   Lab Results  Component Value Date   TRIG 124 08/21/2020   Lab Results  Component Value Date   CHOLHDL 3.1 08/21/2020   Lab Results  Component Value Date   HGBA1C 5.9 (H) 08/21/2020       Assessment & Plan:  1. Acute pain of right knee Risks were discussed including bleeding, infection, increase in sugars if diabetic, atrophy at site of injection, and increased pain.  After consent was obtained, using sterile technique the *right knee was prepped with betadine and alcohol.  The joint was entered and 0 ml's of  fluid was withdrawn. I did not attempt kenalog injection as she just had one 2 weeks ago. The procedure was well tolerated.    At this point I called [facility] to see if we could get her back in.  Dr. Lynnette Caffey said to have her come on over.  This way she is able to get x-rays immediately as well as an order for an MRI eventually if needed.  Meds ordered this  encounter  Medications  . gabapentin (NEURONTIN) 100 MG capsule    Sig: Take 1 capsule (100 mg total) by mouth 3 (three) times daily.    Dispense:  90 capsule    Refill:  0  . meloxicam (MOBIC) 15 MG tablet    Sig: Take 1 tablet (15 mg total) by mouth daily.    Dispense:  30 tablet    Refill:  0    No orders of the defined types were placed in this encounter.    Follow-up: No follow-ups on file.  An After Visit Summary was printed and given to the patient.  Blane Ohara [facility] [phone]

## 2020-09-30 ENCOUNTER — Encounter: Payer: Self-pay | Admitting: Family Medicine

## 2020-10-18 ENCOUNTER — Other Ambulatory Visit: Payer: Self-pay

## 2020-10-18 MED ORDER — EMPAGLIFLOZIN-METFORMIN HCL ER 12.5-1000 MG PO TB24: 12.5000 mg | ORAL_TABLET | Freq: Two times a day (BID) | ORAL | 0 refills | Status: DC

## 2020-10-18 MED ORDER — OMEPRAZOLE 20 MG PO CPDR
20.0000 mg | DELAYED_RELEASE_CAPSULE | Freq: Two times a day (BID) | ORAL | 0 refills | Status: DC
Start: 2020-10-18 — End: 2021-02-17

## 2020-11-19 ENCOUNTER — Other Ambulatory Visit: Payer: Self-pay

## 2020-11-19 MED ORDER — EMPAGLIFLOZIN-METFORMIN HCL ER 12.5-1000 MG PO TB24: 12.5000 mg | ORAL_TABLET | Freq: Two times a day (BID) | ORAL | 0 refills | Status: DC

## 2020-11-26 ENCOUNTER — Encounter: Payer: Self-pay | Admitting: Family Medicine

## 2020-11-26 ENCOUNTER — Other Ambulatory Visit: Payer: Self-pay

## 2020-11-26 ENCOUNTER — Ambulatory Visit (INDEPENDENT_AMBULATORY_CARE_PROVIDER_SITE_OTHER): Payer: Commercial Managed Care - PPO | Admitting: Family Medicine

## 2020-11-26 VITALS — BP 110/70 | HR 68 | Temp 97.5°F | Resp 16 | Ht 64.0 in | Wt 141.0 lb

## 2020-11-26 DIAGNOSIS — E782 Mixed hyperlipidemia: Secondary | ICD-10-CM

## 2020-11-26 DIAGNOSIS — I10 Essential (primary) hypertension: Secondary | ICD-10-CM | POA: Diagnosis not present

## 2020-11-26 DIAGNOSIS — Z23 Encounter for immunization: Secondary | ICD-10-CM

## 2020-11-26 DIAGNOSIS — E1121 Type 2 diabetes mellitus with diabetic nephropathy: Secondary | ICD-10-CM

## 2020-11-26 DIAGNOSIS — K219 Gastro-esophageal reflux disease without esophagitis: Secondary | ICD-10-CM

## 2020-11-26 LAB — POCT UA - MICROALBUMIN: Microalbumin Ur, POC: 150 mg/L

## 2020-11-26 NOTE — Progress Notes (Signed)
Subjective:  Patient ID: Rachel Hensley, female    DOB: July 28, 1956  Age: 64 y.o. MRN: [id number]  Chief Complaint  Patient presents with  . Hypertension  . Diabetes  . Hyperlipidemia    HPI  Diabetes with associated nephropathy: 120-130s. Checking a couple times a week. Eating healthy except the holiday week. Not exercising. Unable to walk due to stress fracture in her right knee. Ortho did an MRI about 8 weeks ago. Pt was limited weight bearing x 4 weeks. Has not completely healed. ON synjardy 12.04/999 mg 2 in am and trulicity 3mg  once weekly. Pt was taking neuropathy for her knee pain, so discontinued. No longer on meloxicam. Checking feet daily. Last eye appt was 12/2019. Saw 01/2020 Eye this year. Intolerant to ace inhibitors - angioedema.  KNEE stress fracture: Taking calcium with vitamin D. DEXA done yesterday and mammogram yesterday.   Hyperlipidemia: lipitor. ASA.   Hypertension; Taking ziac. Bp is well controlled.   Allergic rhinitis: zyrtec helps.   GERD: on prilosec. Has been on for years. Has not tried coming off of it.   Current Outpatient Medications on File Prior to Visit  Medication Sig Dispense Refill  . aspirin 81 MG chewable tablet Chew 81 mg by mouth daily.    [place] atorvastatin (LIPITOR) 20 MG tablet TAKE 1 TABLET DAILY 90 tablet 3  . bisoprolol-hydrochlorothiazide (ZIAC) 5-6.25 MG tablet Take 1 tablet by mouth daily. 90 tablet 1  . cetirizine (ZYRTEC) 10 MG tablet Take 10 mg by mouth daily.    . Dermatological Products, Misc. [facility]) lotion APPLY TO THE AFFECTED AREA(S) ON ON THE LEG TWICE DAILY    . Dulaglutide 3 MG/0.5ML SOPN Inject 3 mg into the skin once a week. 6 mL 0  . Empagliflozin-metFORMIN HCl ER 12.04-999 MG TB24 Take 12.5-1,000 mg by mouth 2 (two) times daily. 180 tablet 0  . EPINEPHrine 0.3 mg/0.3 mL IJ SOAJ injection Inject 0.3 mg into the muscle once.    . gabapentin (NEURONTIN) 100 MG capsule Take 1 capsule (100 mg total) by mouth 3 (three)  times daily. 90 capsule 0  . meloxicam (MOBIC) 15 MG tablet Take 1 tablet (15 mg total) by mouth daily. 30 tablet 0  . omeprazole (PRILOSEC) 20 MG capsule Take 1 capsule (20 mg total) by mouth 2 (two) times daily. 180 capsule 0  . triamcinolone cream (KENALOG) 0.1 % Apply 1 application topically 2 (two) times daily.     No current facility-administered medications on file prior to visit.   Past Medical History:  Diagnosis Date  . Mixed hyperlipidemia   . Nontoxic multinodular goiter   . Other vitamin B12 deficiency anemias   . Solitary pulmonary nodule   . Type 2 diabetes mellitus with other specified complication [facility])    Past Surgical History:  Procedure Laterality Date  . ABDOMINAL HYSTERECTOMY  06/1993   partial; still has both ovaries  . HYSTEROTOMY     partial    Family History  Problem Relation Age of Onset  . Eczema Grandchild   . Atopy Grandchild   . Asthma Son        childhood asthma  . Coronary artery disease Mother   . Glaucoma Mother   . Colon cancer Father   . Kidney cancer Brother   . Diabetes type II Brother   . Allergic rhinitis Neg Hx   . Angioedema Neg Hx   . Immunodeficiency Neg Hx   . Urticaria Neg Hx    Social History  Socioeconomic History  . Marital status: Married    Spouse name: Not on file  . Number of children: 3  . Years of education: Not on file  . Highest education level: Not on file  Occupational History  . Occupation: Geographical information systems officer  Tobacco Use  . Smoking status: Never Smoker  . Smokeless tobacco: Never Used  Vaping Use  . Vaping Use: Never used  Substance and Sexual Activity  . Alcohol use: Never  . Drug use: Never  . Sexual activity: Not on file  Other Topics Concern  . Not on file  Social History Narrative  . Not on file   Social Determinants of Health   Financial Resource Strain:   . Difficulty of Paying Living Expenses: Not on file  Food Insecurity:   . Worried About Programme researcher, broadcasting/film/video in the Last Year: Not on file  .  Ran Out of Food in the Last Year: Not on file  Transportation Needs:   . Lack of Transportation (Medical): Not on file  . Lack of Transportation (Non-Medical): Not on file  Physical Activity:   . Days of Exercise per Week: Not on file  . Minutes of Exercise per Session: Not on file  Stress:   . Feeling of Stress : Not on file  Social Connections:   . Frequency of Communication with Friends and Family: Not on file  . Frequency of Social Gatherings with Friends and Family: Not on file  . Attends Religious Services: Not on file  . Active Member of Clubs or Organizations: Not on file  . Attends Banker Meetings: Not on file  . Marital Status: Not on file    Review of Systems  Constitutional: Negative for chills, fatigue and fever.  HENT: Negative for congestion, rhinorrhea and sore throat.   Respiratory: Negative for cough and shortness of breath.   Cardiovascular: Negative for chest pain.  Gastrointestinal: Negative for abdominal pain, constipation, diarrhea, nausea and vomiting.  Genitourinary: Negative for dysuria and urgency.  Musculoskeletal: Negative for back pain and myalgias.  Neurological: Positive for light-headedness (off balance ). Negative for dizziness, weakness and headaches.  Psychiatric/Behavioral: Negative for dysphoric mood. The patient is not nervous/anxious.      Objective:  BP 110/70   Pulse 68   Temp (!) 97.5 F (36.4 C)   Resp 16   Ht 5\' 4"  (1.626 m)   Wt 141 lb (64 kg)   BMI 24.20 kg/m   BP/Weight 11/26/2020 09/20/2020 08/21/2020  Systolic BP 110 124 128  Diastolic BP 70 76 64  Wt. (Lbs) 141 142 147.2  BMI 24.2 25.56 26.08    Physical Exam Vitals reviewed.  Constitutional:      Appearance: Normal appearance. She is normal weight.  Neck:     Vascular: No carotid bruit.  Cardiovascular:     Rate and Rhythm: Normal rate and regular rhythm.     Pulses: Normal pulses.     Heart sounds: Normal heart sounds. No murmur heard.    Pulmonary:     Effort: Pulmonary effort is normal. No respiratory distress.     Breath sounds: Normal breath sounds.  Abdominal:     General: Abdomen is flat. Bowel sounds are normal.     Palpations: Abdomen is soft.     Tenderness: There is no abdominal tenderness.  Neurological:     Mental Status: She is alert and oriented to person, place, and time.  Psychiatric:        Mood and  Affect: Mood normal.        Behavior: Behavior normal.     Diabetic Foot Exam - Simple   Simple Foot Form Diabetic Foot exam was performed with the following findings: Yes 11/26/2020  8:49 AM  Visual Inspection No deformities, no ulcerations, no other skin breakdown bilaterally: Yes Sensation Testing Intact to touch and monofilament testing bilaterally: Yes Pulse Check Posterior Tibialis and Dorsalis pulse intact bilaterally: Yes Comments      Lab Results  Component Value Date   WBC 11.5 (H) 08/21/2020   HGB 12.8 08/21/2020   HCT 41.0 08/21/2020   PLT 372 08/21/2020   GLUCOSE 91 08/21/2020   CHOL 133 08/21/2020   TRIG 124 08/21/2020   HDL 43 08/21/2020   LDLCALC 68 08/21/2020   ALT 13 08/21/2020   AST 14 08/21/2020   NA 142 08/21/2020   K 4.5 08/21/2020   CL 100 08/21/2020   CREATININE 0.92 08/21/2020   BUN 17 08/21/2020   CO2 25 08/21/2020   HGBA1C 5.9 (H) 08/21/2020   MICROALBUR 80 08/21/2020      Assessment & Plan:   1. Essential hypertension, benign Well controlled.  No changes to medicines.  Continue to work on eating a healthy diet and exercise.  Labs drawn today.  - CBC with Differential/Platelet - Comprehensive metabolic panel  2. Diabetic glomerulopathy (HCC) Control: good Recommend check sugars fasting daily. Recommend check feet daily. Recommend annual eye exams. Medicines: no changes. Consider new medicine for nephropathy if 24 hour urine protein.  Continue to work on eating a healthy diet and exercise.  Labs drawn today.   - Hemoglobin A1c - POCT UA -  Microalbumin - Protein, urine, 24 hour; Future  3. Mixed hyperlipidemia Well controlled.  No changes to medicines.  Continue to work on eating a healthy diet and exercise.  Labs drawn today.  - Lipid panel  4. Need for immunization against influenza - Flu Vaccine QUAD High Dose(Fluad)  5. GERD - Stop omeprazole. If recurrent GERD, restart.    Orders Placed This Encounter  Procedures  . Flu Vaccine QUAD High Dose(Fluad)  . CBC with Differential/Platelet  . Comprehensive metabolic panel  . Hemoglobin A1c  . Lipid panel  . Protein, urine, 24 hour  . POCT UA - Microalbumin    I spent 30 minutes dedicated to the care of this patient on the date of this encounter to include face-to-face time with the patient, as well as: reviewing labs and discussing diabetes nephropathy. Also had long discussion concerning covid 19 vaccination. Pt refuses at this time.   I strongly recommend the covid 19 vaccinations (pfizer or moderna.)  Please also continue to practice social distancing, mask wearing, and hand washing. Please call if you develop symptoms of COVID (fever, cough, shortness of breath, congestion, aches, loss of taset/smell, headaches) regardless of vaccination status..  Follow-up: Return in about 3 months (around 02/24/2021) for fasting.  An After Visit Summary was printed and given to the patient.  Blane Ohara, MD [facility] [phone]

## 2020-11-26 NOTE — Patient Instructions (Addendum)
Recommend hold omeprazole. IF recurrent GERD, restart.    Preventing Osteoporosis, Adult Osteoporosis is a condition that causes the bones to lose density. This means that the bones become thinner, and the normal spaces in bone tissue become larger. Low bone density can make the bones weak and cause them to break more easily. Osteoporosis cannot always be prevented, but you can take steps to lower your risk of developing this condition. How can this condition affect me? If you develop osteoporosis, you will be more likely to break bones in your wrist, spine, or hip. Even a minor accident or injury can be enough to break weak bones. The bones will also be slower to heal. Osteoporosis can cause other problems as well, such as a stooped posture or trouble with movement. Osteoporosis can occur with aging. As you get older, you may lose bone tissue more quickly, or it may be replaced more slowly. Osteoporosis is more likely to develop if you have poor nutrition or do not get enough calcium or vitamin D. Other lifestyle factors can also play a role. By eating a well-balanced diet and making lifestyle changes, you can help keep your bones strong and healthy, lowering your chances of developing osteoporosis. What can increase my risk? The following factors may make you more likely to develop osteoporosis:  Having a family history of the condition.  Having poor nutrition or not getting enough calcium or vitamin D.  Using certain medicines, such as steroid medicines or antiseizure medicines.  Being any of the following: ? 69 years of age or older. ? Female. ? A woman who has gone through menopause (is postmenopausal). ? White (Caucasian) or of Asian descent.  Smoking or having a history of smoking.  Not being physically active (being sedentary).  Having a small body frame. What actions can I take to prevent this?  Get enough calcium   Make sure you get enough calcium every day. Calcium is the  most important mineral for bone health. Most people can get enough calcium from their diet, but supplements may be recommended for people who are at risk for osteoporosis. Follow these guidelines: ? If you are age 61 or younger, aim to get 1,000 mg of calcium every day. ? If you are older than age 65, aim to get 1,200 mg of calcium every day.  Good sources of calcium include: ? Dairy products, such as low-fat or nonfat milk, cheese, and yogurt. ? Dark green leafy vegetables, such as bok choy and broccoli. ? Foods that have had calcium added to them (calcium-fortified foods), such as orange juice, cereal, bread, soy beverages, and tofu products. ? Nuts, such as almonds.  Check nutrition labels to see how much calcium is in a food or drink. Get enough vitamin D  Try to get enough vitamin D every day. Vitamin D is the most essential vitamin for bone health. It helps the body absorb calcium. Follow these guidelines for how much vitamin D to get from food: ? If you are age 50 or younger, aim to get at least 600 international units (IU) every day. Your health care provider may suggest more. ? If you are older than age 36, aim to get at least 800 international units every day. Your health care provider may suggest more.  Good sources of vitamin D in your diet include: ? Egg yolks. ? Oily fish, such as salmon, sardines, and tuna. ? Milk and cereal fortified with vitamin D.  Your body also makes vitamin D  when you are out in the sun. Exposing the bare skin on your face, arms, legs, or back to the sun for no more than 30 minutes a day, 2 times a week is more than enough. Beyond that, make sure you use sunblock to protect your skin from sunburn, which increases your risk for skin cancer. Exercise  Stay active and get exercise every day.  Ask your health care provider what types of exercise are best for you. Weight-bearing and strength-building activities are important for building and maintaining  healthy bones. Some examples of these types of activities include: ? Walking and hiking. ? Jogging and running. ? Dancing. ? Gym exercises. ? Lifting weights. ? Tennis and racquetball. ? Climbing stairs. ? Aerobics. Make other lifestyle changes  Do not use any products that contain nicotine or tobacco, such as cigarettes, e-cigarettes, and chewing tobacco. If you need help quitting, ask your health care provider.  Lose weight if you are overweight.  If you drink alcohol: ? Limit how much you use to:  0-1 drink a day for nonpregnant women.  0-2 drinks a day for men. ? Be aware of how much alcohol is in your drink. In the [place] one drink equals one 12 oz bottle of beer (355 mL), one 5 oz glass of wine (148 mL), or one 1 oz glass of hard liquor (44 mL). Where to find support If you need help making changes to prevent osteoporosis, talk with your health care provider. You can ask for a referral to a diet and nutrition specialist (dietitian) and a physical therapist. Where to find more information Learn more about osteoporosis from:  NIH Osteoporosis and Related Bone Diseases National Resource Center: www.bones.http://www.myers.net/  [place] Office on [employer] Health: http://hoffman.com/  National Osteoporosis Foundation: [employer] Summary  Osteoporosis is a condition that causes weak bones that are more likely to break.  Eat a healthy diet, making sure you get enough calcium and vitamin D, and stay active by getting regular exercise to help prevent osteoporosis.  Other ways to reduce your risk of osteoporosis include maintaining a healthy weight and avoiding alcohol and products that contain nicotine or tobacco. This information is not intended to replace advice given to you by your health care provider. Make sure you discuss any questions you have with your health care provider. Document Revised: 07/14/2019 Document Reviewed: 07/14/2019 Elsevier Patient Education  [employer].   I  strongly recommend the covid 19 vaccinations (pfizer or moderna.)  Please also continue to practice social distancing, mask wearing, and hand washing. Please call if you develop symptoms of COVID (fever, cough, shortness of breath, congestion, aches, loss of taset/smell, headaches) regardless of vaccination status.Marland Kitchen

## 2020-11-27 LAB — COMPREHENSIVE METABOLIC PANEL
ALT: 18 IU/L (ref 0–32)
AST: 15 IU/L (ref 0–40)
Albumin/Globulin Ratio: 1.7 (ref 1.2–2.2)
Albumin: 4.4 g/dL (ref 3.8–4.8)
Alkaline Phosphatase: 95 IU/L (ref 44–121)
BUN/Creatinine Ratio: 29 — ABNORMAL HIGH (ref 12–28)
BUN: 18 mg/dL (ref 8–27)
Bilirubin Total: 0.3 mg/dL (ref 0.0–1.2)
CO2: 24 mmol/L (ref 20–29)
Calcium: 9.9 mg/dL (ref 8.7–10.3)
Chloride: 105 mmol/L (ref 96–106)
Creatinine, Ser: 0.63 mg/dL (ref 0.57–1.00)
GFR calc Af Amer: 108 mL/min/{1.73_m2} (ref >59)
GFR calc non Af Amer: 94 mL/min/{1.73_m2} (ref >59)
Globulin, Total: 2.6 g/dL (ref 1.5–4.5)
Glucose: 110 mg/dL — ABNORMAL HIGH (ref 65–99)
Potassium: 4.7 mmol/L (ref 3.5–5.2)
Sodium: 143 mmol/L (ref 134–144)
Total Protein: 7 g/dL (ref 6.0–8.5)

## 2020-11-27 LAB — CBC WITH DIFFERENTIAL/PLATELET
Basophils Absolute: 0 10*3/uL (ref 0.0–0.2)
Basos: 1 %
EOS (ABSOLUTE): 0.3 10*3/uL (ref 0.0–0.4)
Eos: 3 %
Hematocrit: 39.2 % (ref 34.0–46.6)
Hemoglobin: 13.1 g/dL (ref 11.1–15.9)
Immature Grans (Abs): 0 10*3/uL (ref 0.0–0.1)
Immature Granulocytes: 1 %
Lymphocytes Absolute: 2.9 10*3/uL (ref 0.7–3.1)
Lymphs: 33 %
MCH: 27.7 pg (ref 26.6–33.0)
MCHC: 33.4 g/dL (ref 31.5–35.7)
MCV: 83 fL (ref 79–97)
Monocytes Absolute: 0.7 10*3/uL (ref 0.1–0.9)
Monocytes: 8 %
Neutrophils Absolute: 4.9 10*3/uL (ref 1.4–7.0)
Neutrophils: 54 %
Platelets: 344 10*3/uL (ref 150–450)
RBC: 4.73 x10E6/uL (ref 3.77–5.28)
RDW: 15.3 % (ref 11.7–15.4)
WBC: 8.8 10*3/uL (ref 3.4–10.8)

## 2020-11-27 LAB — LIPID PANEL
Chol/HDL Ratio: 2.9 ratio (ref 0.0–4.4)
Cholesterol, Total: 155 mg/dL (ref 100–199)
HDL: 54 mg/dL (ref >39)
LDL Chol Calc (NIH): 81 mg/dL (ref 0–99)
Triglycerides: 113 mg/dL (ref 0–149)
VLDL Cholesterol Cal: 20 mg/dL (ref 5–40)

## 2020-11-27 LAB — HEMOGLOBIN A1C
Est. average glucose Bld gHb Est-mCnc: 148 mg/dL
Hgb A1c MFr Bld: 6.8 % — ABNORMAL HIGH (ref 4.8–5.6)

## 2020-11-27 LAB — CARDIOVASCULAR RISK ASSESSMENT

## 2020-11-28 ENCOUNTER — Other Ambulatory Visit: Payer: Self-pay

## 2020-11-28 DIAGNOSIS — Z1231 Encounter for screening mammogram for malignant neoplasm of breast: Secondary | ICD-10-CM

## 2020-11-28 DIAGNOSIS — N958 Other specified menopausal and perimenopausal disorders: Secondary | ICD-10-CM

## 2020-11-29 MED ORDER — TRULICITY 4.5 MG/0.5ML ~~LOC~~ SOAJ: 4.5000 mg | SUBCUTANEOUS | 0 refills | Status: DC

## 2020-12-02 ENCOUNTER — Other Ambulatory Visit: Payer: Self-pay | Admitting: Family Medicine

## 2020-12-02 DIAGNOSIS — E1121 Type 2 diabetes mellitus with diabetic nephropathy: Secondary | ICD-10-CM

## 2020-12-03 LAB — PROTEIN, URINE, 24 HOUR
Protein, 24H Urine: 146 mg/24 hr (ref 30–150)
Protein, Ur: 12.2 mg/dL

## 2020-12-08 NOTE — Progress Notes (Signed)
Call patient about results lp

## 2020-12-31 ENCOUNTER — Telehealth: Payer: Self-pay

## 2020-12-31 NOTE — Telephone Encounter (Signed)
Rachel Hensley called to let us know that she was seen at [facility]gent [facility] on Sunday and had a negative rapid covid test.  She was given Amoxicillin and sent home.  She continues to run a fever in the evening of 101.  She denies cough or shortness of breath.  She has had a dull headache.  She was exposed to a family member who tested positive over 1 week before she started with symptoms.  Rachel Sofia, PA advised that she continue to quarantine at home with fever.  To follow-up in the office if symptoms do not improve.

## 2021-02-03 ENCOUNTER — Telehealth: Payer: Self-pay

## 2021-02-03 NOTE — Telephone Encounter (Signed)
Transition Care Management Follow-up Telephone Call  Date of discharge and from where:   How have you been since you were released from the hospital? Pt states that she is still having the side pain and wanted to give it a few more days. Pt stated that she would call her PCP for a sooner appt.   Any questions or concerns? No  Items Reviewed:  Did the pt receive and understand the discharge instructions provided? Yes   Medications obtained and verified? No   Other? No   Any new allergies since your discharge? No   Dietary orders reviewed? N/A  Do you have support at home? Yes   Functional Questionnaire: (I = Independent and D = Dependent) ADLs: I  Bathing/Dressing- I  Meal Prep- I  Eating- I  Maintaining continence- I  Transferring/Ambulation- I  Managing Meds- I  Follow up appointments reviewed:   PCP Hospital f/u appt confirmed? No  Pt encouraged to reach out to her PCP for a sooner appointment than March 1st.   Are transportation arrangements needed? No   If their condition worsens, is the pt aware to call PCP or go to the Emergency Dept.? Yes  Was the patient provided with contact information for the PCP's office or ED? Yes  Was to pt encouraged to call back with questions or concerns? Yes

## 2021-02-15 ENCOUNTER — Other Ambulatory Visit: Payer: Self-pay | Admitting: Family Medicine

## 2021-02-17 ENCOUNTER — Encounter: Payer: Self-pay | Admitting: Nurse Practitioner

## 2021-02-17 ENCOUNTER — Other Ambulatory Visit: Payer: Self-pay

## 2021-02-17 ENCOUNTER — Ambulatory Visit: Payer: Managed Care, Other (non HMO) | Admitting: Nurse Practitioner

## 2021-02-17 ENCOUNTER — Encounter: Payer: Self-pay | Admitting: Family Medicine

## 2021-02-17 VITALS — BP 112/76 | HR 83 | Temp 98.1°F | Ht 64.0 in | Wt 137.0 lb

## 2021-02-17 DIAGNOSIS — R748 Abnormal levels of other serum enzymes: Secondary | ICD-10-CM | POA: Diagnosis not present

## 2021-02-17 DIAGNOSIS — R10816 Epigastric abdominal tenderness: Secondary | ICD-10-CM

## 2021-02-17 DIAGNOSIS — R112 Nausea with vomiting, unspecified: Secondary | ICD-10-CM

## 2021-02-17 DIAGNOSIS — R197 Diarrhea, unspecified: Secondary | ICD-10-CM | POA: Diagnosis not present

## 2021-02-17 LAB — CBC WITH DIFFERENTIAL/PLATELET
Basophils Absolute: 0 10*3/uL (ref 0.0–0.2)
Basos: 1 %
EOS (ABSOLUTE): 0.2 10*3/uL (ref 0.0–0.4)
Eos: 2 %
Hematocrit: 40.8 % (ref 34.0–46.6)
Hemoglobin: 13.3 g/dL (ref 11.1–15.9)
Immature Grans (Abs): 0 10*3/uL (ref 0.0–0.1)
Immature Granulocytes: 1 %
Lymphocytes Absolute: 3.1 10*3/uL (ref 0.7–3.1)
Lymphs: 43 %
MCH: 27.6 pg (ref 26.6–33.0)
MCHC: 32.6 g/dL (ref 31.5–35.7)
MCV: 85 fL (ref 79–97)
Monocytes Absolute: 0.6 10*3/uL (ref 0.1–0.9)
Monocytes: 8 %
Neutrophils Absolute: 3.3 10*3/uL (ref 1.4–7.0)
Neutrophils: 45 %
Platelets: 378 10*3/uL (ref 150–450)
RBC: 4.82 x10E6/uL (ref 3.77–5.28)
RDW: 15.2 % (ref 11.7–15.4)
WBC: 7.3 10*3/uL (ref 3.4–10.8)

## 2021-02-17 MED ORDER — OMEPRAZOLE 20 MG PO CPDR: 20.0000 mg | DELAYED_RELEASE_CAPSULE | Freq: Two times a day (BID) | ORAL | 0 refills | Status: DC

## 2021-02-17 MED ORDER — PROMETHAZINE HCL 25 MG PO TABS: 25.0000 mg | ORAL_TABLET | Freq: Three times a day (TID) | ORAL | 0 refills | Status: AC | PRN

## 2021-02-17 MED ORDER — ONDANSETRON 4 MG PO TBDP: 4.0000 mg | ORAL_TABLET | Freq: Three times a day (TID) | ORAL | 0 refills | Status: AC | PRN

## 2021-02-17 NOTE — Patient Instructions (Addendum)
Increase Omeprazole to 40 mg daily Follow-up with Dr Melina Copa for screening colonoscopy Gallbladder diet We will call you with appt for HIDA scan (gallbladder functioning test) and lab results Zofran 4 mg as needed for nausea and vomiting Phenergan 25 mg as needed for nausea and vomiting Gallbladder Eating Plan If you have a gallbladder condition, you may have trouble digesting fats. Eating a low-fat diet can help reduce your symptoms, and may be helpful before and after having surgery to remove your gallbladder (cholecystectomy). Your health care provider may recommend that you work with a diet and nutrition specialist (dietitian) to help you reduce the amount of fat in your diet. What are tips for following this plan? General guidelines  Limit your fat intake to less than 30% of your total daily calories. If you eat around 1,800 calories each day, this is less than 60 grams (g) of fat per day.  Fat is an important part of a healthy diet. Eating a low-fat diet can make it hard to maintain a healthy body weight. Ask your dietitian how much fat, calories, and other nutrients you need each day.  Eat small, frequent meals throughout the day instead of three large meals.  Drink at least 8-10 cups of fluid a day. Drink enough fluid to keep your urine clear or pale yellow.  Limit alcohol intake to no more than 1 drink a day for nonpregnant women and 2 drinks a day for men. One drink equals 12 oz of beer, 5 oz of wine, or 1 oz of hard liquor. Reading food labels  Check Nutrition Facts on food labels for the amount of fat per serving. Choose foods with less than 3 grams of fat per serving.   Shopping  Choose nonfat and low-fat healthy foods. Look for the words "nonfat," "low fat," or "fat free."  Avoid buying processed or prepackaged foods. Cooking  Cook using low-fat methods, such as baking, broiling, grilling, or boiling.  Cook with small amounts of healthy fats, such as olive oil, grapeseed  oil, canola oil, or sunflower oil. What foods are recommended?  All fresh, frozen, or canned fruits and vegetables.  Whole grains.  Low-fat or non-fat (skim) milk and yogurt.  Lean meat, skinless poultry, fish, eggs, and beans.  Low-fat protein supplement powders or drinks.  Spices and herbs. What foods are not recommended?  High-fat foods. These include baked goods, fast food, fatty cuts of meat, ice cream, french toast, sweet rolls, pizza, cheese bread, foods covered with butter, creamy sauces, or cheese.  Fried foods. These include french fries, tempura, battered fish, breaded chicken, fried breads, and sweets.  Foods with strong odors.  Foods that cause bloating and gas. Summary  A low-fat diet can be helpful if you have a gallbladder condition, or before and after gallbladder surgery.  Limit your fat intake to less than 30% of your total daily calories. This is about 60 g of fat if you eat 1,800 calories each day.  Eat small, frequent meals throughout the day instead of three large meals. This information is not intended to replace advice given to you by your health care provider. Make sure you discuss any questions you have with your health care provider. Document Revised: 08/01/2020 Document Reviewed: 08/01/2020 Elsevier Patient Education  2021 [place]. Gallbladder Nuclear Scan  A gallbladder nuclear scan (hepatobiliary scan or HIDA scan) is an imaging test that checks the function of your liver, gallbladder, and the ducts of the liver and gallbladder. These parts make  up the hepatobiliary system. You may need this scan if you have symptoms of liver or gallbladder disease. For this exam, a radioactive substance (radiotracer) will be injected into your bloodstream through an IV. As the radiotracer moves through your hepatobiliary system, a camera will detect the energy that the radiotracer gives off. The camera will produce images that show how well your hepatobiliary  system is functioning. Tell a health care provider about:  Any allergies you have.  All medicines you are taking, including vitamins, herbs, eye drops, creams, and over-the-counter medicines.  Any problems you or your family members have had with anesthetic medicines.  Any blood disorders you have.  Any surgeries you have had.  Any medical conditions you have.  Whether you are pregnant, may be pregnant, or are breastfeeding. What are the risks?  Getting a gallbladder nuclear scan is a safe procedure. However, you will be exposed to a small amount of radiation.  There is a risk of an allergic reaction to medicines used during the test. What happens before the procedure? General instructions  Follow instructions from your health care provider about eating or drinking restrictions. You may not be able to eat or drink anything for 4 hours before the test.  Ask your health care provider about changing or stopping your regular medicines. What happens during the procedure?  You will lie down on your back.  An IV will be inserted into a vein in your hand or arm.  Radiotracer will be injected through your IV. You may feel a cold sensation as this happens.  A camera (gamma camera) will be moved over your abdomen.  Images will be taken. The camera may rotate around your body. You may be asked to stay still or move into a certain position.  You may be given a medicine (cholecystokinin, CCK) through your IV. This medicine will make your gallbladder empty. You may feel some nausea or have cramping for a short time.  More images will be taken.  After all images have been taken, your IV will be removed. The procedure may vary among health care providers and hospitals.   What happens after the procedure?  You should drink plenty of fluids to help your body get rid of the radiotracer.  It is up to you to get your test results. Ask your health care provider, or the department that is  doing the test, when your results will be ready. Summary  A gallbladder nuclear scan (hepatobiliary scan or HIDA scan) is an imaging test that checks the function of your liver, gallbladder, and the ducts of the liver and gallbladder.  You may need this scan if you have symptoms of liver or gallbladder disease.  For this exam, a radioactive substance (radiotracer) will be injected into your bloodstream through an IV.  The camera will produce images that show how well your hepatobiliary system is functioning. This information is not intended to replace advice given to you by your health care provider. Make sure you discuss any questions you have with your health care provider. Document Revised: 12/24/2017 Document Reviewed: 12/24/2017 Elsevier Patient Education  2021 [place]. Promethazine tablets What is this medicine? PROMETHAZINE (proe METH a zeen) is an antihistamine. It is used to treat allergic reactions and to treat or prevent nausea and vomiting from illness or motion sickness. It is also used to make you sleep before surgery, and to help treat pain or nausea after surgery. This medicine may be used for other purposes; ask  your health care provider or pharmacist if you have questions. COMMON BRAND NAME(S): Phenergan What should I tell my health care provider before I take this medicine? They need to know if you have any of these conditions:  blockage in your bowel  diabetes  glaucoma  have trouble controlling your muscles  heart disease  liver disease  low blood counts, like low white cell, platelet, or red cell counts  lung or breathing disease, like asthma  Parkinson's disease  prostate disease  seizures  stomach or intestine problems  trouble passing urine  an unusual or allergic reaction to promethazine, sulfites, other medicines, foods, dyes, or preservatives  pregnant or trying to get pregnant  breast-feeding How should I use this medicine? Take  this medicine by mouth with a glass of water. Follow the directions on the prescription label. Take your doses at regular intervals. Do not take your medicine more often than directed. Talk to your pediatrician regarding the use of this medicine in children. Special care may be needed. This medicine should not be given to infants and children younger than 65 years old. Overdosage: If you think you have taken too much of this medicine contact a poison control center or emergency room at once. NOTE: This medicine is only for you. Do not share this medicine with others. What if I miss a dose? If you miss a dose, take it as soon as you can. If it is almost time for your next dose, take only that dose. Do not take double or extra doses. What may interact with this medicine?  alcohol  antihistamines for allergy, cough, and cold  atropine  certain medicines for anxiety or sleep  certain medicines for bladder problems like oxybutynin, tolterodine  certain medicines for depression like amitriptyline, fluoxetine, sertraline  certain medicines for Parkinson's disease like benztropine, trihexyphenidyl  certain medicines for stomach problems like dicyclomine, hyoscyamine  certain medicines for travel sickness like scopolamine  epinephrine  general anesthetics like halothane, isoflurane, methoxyflurane, propofol  ipratropium  MAOIs like Marplan, Nardil, and Parnate  medicines for high blood pressure  medicines for seizures like phenobarbital, primidone, phenytoin  medicines that relax muscles for surgery  metoclopramide  narcotic medicines for pain This list may not describe all possible interactions. Give your health care provider a list of all the medicines, herbs, non-prescription drugs, or dietary supplements you use. Also tell them if you smoke, drink alcohol, or use illegal drugs. Some items may interact with your medicine. What should I watch for while using this medicine? Visit  your health care professional for regular checks on your progress. Tell your health care professional if symptoms do not start to get better or if they get worse. You may get drowsy or dizzy. Do not drive, use machinery, or do anything that needs mental alertness until you know how this medicine affects you. To reduce the risk of dizzy or fainting spells, do not stand or sit up quickly, especially if you are an older patient. Alcohol may increase dizziness and drowsiness. Avoid alcoholic drinks. Your mouth may get dry. Chewing sugarless gum or sucking hard candy, and drinking plenty of water may help. Contact your doctor if the problem does not go away or is severe. This medicine may cause dry eyes and blurred vision. If you wear contact lenses you may feel some discomfort. Lubricating drops may help. See your eye doctor if the problem does not go away or is severe. This medicine can make you more sensitive to  the sun. Keep out of the sun. If you cannot avoid being in the sun, wear protective clothing and use sunscreen. Do not use sun lamps or tanning beds/booths. This medicine may increase blood sugar. Ask your health care provider if changes in diet or medicines are needed if you have diabetes. What side effects may I notice from receiving this medicine? Side effects that you should report to your doctor or health care professional as soon as possible:  allergic reactions like skin rash, itching or hives, swelling of the face, lips, or tongue  breathing problems  changes in vision  confusion  fever, chills, sore throat  pain, redness, or irritation at site where injected  seizures  signs and symptoms of high blood sugar such as being more thirsty or hungry or having to urinate more than normal. You may also feel very tired or have blurry vision.  signs and symptoms of liver injury like dark yellow or brown urine; general ill feeling or flu-like symptoms; light-colored stools; loss of  appetite; nausea; right upper belly pain; unusually weak or tired; yellowing of the eyes or skin  signs and symptoms of low blood pressure like dizziness; feeling faint or lightheaded, falls; unusually weak or tired  trouble passing urine or change in the amount of urine  trouble swallowing  uncontrollable movements of the arms, face, head, mouth, neck, or upper body  unusual bruising or bleeding  unusually weak or tired Side effects that usually do not require medical attention (report to your doctor or health care professional if they continue or are bothersome):  drowsiness  dry mouth  restlessness This list may not describe all possible side effects. Call your doctor for medical advice about side effects. You may report side effects to FDA at 1-800-FDA-1088. Where should I keep my medicine? Keep out of the reach of children. Store at room temperature, between 20 and 25 degrees C (68 and 77 degrees F). Protect from light. Throw away any unused medicine after the expiration date. NOTE: This sheet is a summary. It may not cover all possible information. If you have questions about this medicine, talk to your doctor, pharmacist, or health care provider.  2021 Elsevier/Gold Standard (2019-10-23 13:27:34) Ondansetron tablets What is this medicine? ONDANSETRON (on DAN se tron) is used to treat nausea and vomiting caused by chemotherapy. It is also used to prevent or treat nausea and vomiting after surgery. This medicine may be used for other purposes; ask your health care provider or pharmacist if you have questions. COMMON BRAND NAME(S): Zofran What should I tell my health care provider before I take this medicine? They need to know if you have any of these conditions:  heart disease  history of irregular heartbeat  liver disease  low levels of magnesium or potassium in the blood  an unusual or allergic reaction to ondansetron, granisetron, other medicines, foods, dyes, or  preservatives  pregnant or trying to get pregnant  breast-feeding How should I use this medicine? Take this medicine by mouth with a glass of water. Follow the directions on your prescription label. Take your doses at regular intervals. Do not take your medicine more often than directed. Talk to your pediatrician regarding the use of this medicine in children. Special care may be needed. Overdosage: If you think you have taken too much of this medicine contact a poison control center or emergency room at once. NOTE: This medicine is only for you. Do not share this medicine with others. What if  I miss a dose? If you miss a dose, take it as soon as you can. If it is almost time for your next dose, take only that dose. Do not take double or extra doses. What may interact with this medicine? Do not take this medicine with any of the following medications:  apomorphine  certain medicines for fungal infections like fluconazole, itraconazole, ketoconazole, posaconazole, voriconazole  cisapride  dronedarone  pimozide  thioridazine This medicine may also interact with the following medications:  carbamazepine  certain medicines for depression, anxiety, or psychotic disturbances  fentanyl  linezolid  MAOIs like Carbex, Eldepryl, Marplan, Nardil, and Parnate  methylene blue (injected into a vein)  other medicines that prolong the QT interval (cause an abnormal heart rhythm) like dofetilide, ziprasidone  phenytoin  rifampicin  tramadol This list may not describe all possible interactions. Give your health care provider a list of all the medicines, herbs, non-prescription drugs, or dietary supplements you use. Also tell them if you smoke, drink alcohol, or use illegal drugs. Some items may interact with your medicine. What should I watch for while using this medicine? Check with your doctor or health care professional right away if you have any sign of an allergic reaction. What  side effects may I notice from receiving this medicine? Side effects that you should report to your doctor or health care professional as soon as possible:  allergic reactions like skin rash, itching or hives, swelling of the face, lips or tongue  breathing problems  confusion  dizziness  fast or irregular heartbeat  feeling faint or lightheaded, falls  fever and chills  loss of balance or coordination  seizures  sweating  swelling of the hands or feet  tightness in the chest  tremors  unusually weak or tired Side effects that usually do not require medical attention (report to your doctor or health care professional if they continue or are bothersome):  constipation or diarrhea  headache This list may not describe all possible side effects. Call your doctor for medical advice about side effects. You may report side effects to FDA at 1-800-FDA-1088. Where should I keep my medicine? Keep out of the reach of children. Store between 2 and 30 degrees C (36 and 86 degrees F). Throw away any unused medicine after the expiration date. NOTE: This sheet is a summary. It may not cover all possible information. If you have questions about this medicine, talk to your doctor, pharmacist, or health care provider.  2021 Elsevier/Gold Standard (2018-12-06 07:16:43)   Blood Amylase Test Why am I having this test? The blood amylase test is used to check for inflammation of the pancreas (pancreatitis) and to monitor the progression and treatment of this condition. Your health care provider may perform this test if you have a sudden onset of pain in your abdomen (acute abdominal pain). What is being tested? This test measures amylase in your blood. Amylase is an enzyme that helps in the digestion of starch. The levels of this enzyme rise in your blood within the first 12 hours of the onset of acute diseases of the pancreas. Amylase levels are also raised in people who have persistent  disorders of the pancreas. What kind of sample is taken? A blood sample is required for this test. It is usually collected by inserting a needle into a blood vessel.   How do I prepare for this test? Do not eat or drink anything except water for 8-12 hours before the test or as told  by your health care provider. Tell your health care provider about all medicines you are taking, including vitamins, herbs, eye drops, creams, and over-the-counter medicines. Avoid alcohol for 24 hours before the test. How are the results reported? Your test results will be reported as a value. Your health care provider will compare your results to normal ranges that were established after testing a large group of people (reference ranges). Reference ranges may vary among labs and hospitals. For this test, common reference ranges are: Adult: 60-120 [place] units/dL or 30-220 units/L (SI units). Newborn: 6-65 units/L. Amylase levels remain low for the first 2 months of life. They increase to adult values by 67 year of age. Values may be slightly increased during normal pregnancy and in older adults. What do the results mean? Values that are greater than the reference ranges may indicate: Sudden (acute) or long-lasting (chronic) pancreatitis. Other gastrointestinal (GI) diseases, such as a peptic ulcer that involves the pancreas. Kidney failure. A complication of diabetes in which the body breaks down fat instead of glucose (diabetic ketoacidosis). Talk with your health care provider about what your results mean. Questions to ask your health care provider Ask your health care provider, or the department that is doing the test: When will my results be ready? How will I get my results? What are my treatment options? What other tests do I need? What are my next steps? Summary The blood amylase test measures the level of amylase in your blood to check for inflammation of the pancreas (pancreatitis). Your test results  will be reported as a value. Values that are greater than the normal reference ranges may indicate the presence of acute or chronic pancreatitis or other gastrointestinal diseases. Talk with your health care provider about what your results mean. This information is not intended to replace advice given to you by your health care provider. Make sure you discuss any questions you have with your health care provider. Document Revised: 09/26/2020 Document Reviewed: 09/26/2020 Elsevier Patient Education  2021 [place].  Complete Blood Count Why am I having this test? A complete blood count (CBC) is a blood test that measures the cells of your blood. The types of cells are red blood cells (RBCs), white blood cells (WBCs), and blood cell fragments that help with clotting (platelets). Changes in these cells can warn your health care provider about many conditions, including anemia, infection, inflammation, bleeding, and blood-related cancer. You may have this test:  As part of a routine physical exam.  To help your health care provider diagnose certain health conditions.  To help your health care provider monitor known health conditions or treatments. What is being tested? A CBC measures your RBCs, WBCs, platelets, and their different components. RBCs are made in the tissue inside your bones (bone marrow) and released into your blood. These cells contain a protein that carries oxygen in your blood (hemoglobin). CBC testing of RBCs includes:  RBC count. This is the total number of RBCs.  Hemoglobin (Hb). This is the total amount of hemoglobin.  Hematocrit (Hct). This is the percentage of space that red blood cells take up in your blood.  Mean corpuscular volume (MCV). This is the average size of red blood cells.  Mean corpuscular hemoglobin [facility]). This is the average amount of hemoglobin inside each red blood cell.  Mean corpuscular hemoglobin concentration (MCHC). This is the average  concentration of hemoglobin inside each red blood cell.  Red blood cell distribution width (RDW). This may be included  to measure the variation in the size of red blood cells. WBCs are also made in your bone marrow. They are part of your body's disease-fighting system (immune system). CBC testing of WBCs includes:  WBC count. This is the total number of WBCs.  A count of the five types of WBCs: ? Neutrophils. ? Lymphocytes. ? Monocytes. ? Eosinophils. ? Basophils. Platelets are cell fragments that are important for blood clotting. A CBC measures:  Total platelet count.  Mean platelet volume (MPV). What kind of sample is taken? A blood sample is required for this test. It is usually collected by inserting a needle into a blood vessel.   How are the results reported? Your test results will be reported as values. Your health care provider will compare your results to normal ranges that were established after testing a large group of people (reference ranges). Reference ranges may vary among labs and hospitals. Reference ranges for a CBC usually apply only to people who are older than 18. For this test, common reference ranges for people older than 18 may be: Red blood cells  RBC count ? Men: 4.7-6.1 ? Women: 4.2-5.4  Hb ? Men: 42-18 ? Women: 12-16  Hct ? Men: 42-52% ? Women: 37-47%  MCV: 80-95  MCH: 27-31  MCHC: 32-36  RDW: 11-14.5% White blood cells  WBC count: 5,000-10,000  Neutrophil count: 2500-8000 or 55-70%  Lymphocyte count: 1000-4000 or 20-40%  Monocyte count: 100-700 or 2-8%  Eosinophil count: 50-500 or 1-4%  Basophil count: 25-100 or 0.5-1% Platelets  Platelet count: 150,000-400,000  MPV: 7.4-10.4 What do the results mean? Results that are outside the reference ranges may indicate that you have an infection or other health condition. Red blood cells  RBC count, Hb, and Hct ? Results lower than the reference ranges may indicate anemia. Anemia  may be caused by several conditions, such as iron deficiency anemia. ? Results higher than the reference ranges may indicate polycythemia. This may be caused by several conditions, such as chronic obstructive pulmonary disease (COPD).  MCV, MCH, MCHC, and RDW ? Results outside the reference ranges may indicate a condition that causes anemia. White blood cells  WBC count ? Results lower than the reference range may indicate an infection or a condition that keeps your bone marrow from making new WBCs. ? Results higher than the reference range may indicate an infection, a condition that causes inflammation (autoimmune disease), or a blood-related cancer.  Neutrophils, lymphocytes, and monocytes ? Results outside the reference ranges may indicate an infection, an autoimmune disease, or certain types of cancer.  Eosinophils and basophils ? Results outside the reference ranges may indicate an allergy, an inflammatory condition such as asthma, or blood cell cancer. Platelets  Platelet count ? Results lower than the reference range may indicate an infection or blood cell cancer. ? Results higher than the reference range may indicate certain types of anemia, an autoimmune disease, or certain cancers.  MPV ? High or low results may indicate a bone marrow abnormality. Talk with your health care provider about what your results mean. Questions to ask your health care provider Ask your health care provider or the department that is doing the test:  When will my results be ready?  How will I get my results?  What are my treatment options?  What other tests do I need?  What are my next steps? Summary  A CBC is a routine blood test that measures the cells in your blood.  Changes in  these cells can indicate many conditions, including anemia, infection, inflammation, and blood cell cancer.  A health care provider will collect a sample of your blood for this test by inserting a needle into a  blood vessel.  Talk with your health care provider about what your results mean. This information is not intended to replace advice given to you by your health care provider. Make sure you discuss any questions you have with your health care provider. Document Revised: 05/29/2020 Document Reviewed: 05/29/2020 Elsevier Patient Education  [employer].  Comprehensive Metabolic Panel Why am I having this test? Your health care provider may order the comprehensive metabolic panel (CMP) to check the level of numerous substances in your blood. These include minerals in your body (electrolytes). It also checks the balance of body fluids (hydration or dehydration). The test results can also help tell you and your health care provider how well your liver and kidneys are working. What is being tested? The comprehensive metabolic panel measures the levels of the following substances in your blood:  Glucose. Glucose is a simple sugar that serves as the main source of energy for your body.  Creatinine. Creatinine is a waste product of normal muscle activity. It is excreted from the body by the kidneys.  Blood urea nitrogen (BUN). Urea nitrogen is a waste product of protein breakdown. It is produced when excess protein in your body is broken down and used for energy. It is excreted by the kidneys.  Electrolytes. Electrolytes are negatively or positively charged particles that are dissolved in the water of different body compartments. This includes the serum portion of blood, water inside cells, and water outside cells. Concentrations of electrolytes vary among the different fluid compartments. Electrolytes are tightly regulated to maintain a salt-water and acid-base balance in the body. The electrolytes include: ? Potassium (K+). ? Sodium (Na+). ? Chloride (Cl-). ? Calcium (Ca). ? Bicarbonate (HCO3-).  Alkaline phosphatase (ALP). This is a protein that is found in all tissues of your body. The  bones, liver, and bile ducts have the highest amounts.  Alanine aminotransferase (ALT). ALT is an enzyme found throughout the body. The highest concentrations of ALT are in the tissues of the liver. If your liver is injured, there will also be ALT in your bloodstream.  Aspartate aminotransferase (AST). This is another enzyme found mostly in your liver. There is also a high concentration in your muscle cells and heart. Testing for AST helps check for liver damage.  Bilirubin. As the liver breaks down red blood cells, it produces a waste product called bilirubin. A high level of bilirubin can indicate certain health problems.  Albumin. This is a protein that is a major component of the liquid part of blood (serum). It is made by the liver and can be measured in the bloodstream.  Total protein. This is a measurement of the amount of the two types of protein found in blood serum--albumin and globulins. Globulins are part of your body's defense (immune) system. What kind of sample is taken? A blood sample is required for this test. It is usually collected by inserting a needle into a blood vessel.   How do I prepare for this test? Your health care provider may ask you not to eat or drink anything for 6-8 hours before your blood sample is taken. Follow instructions from your health care provider about eating and drinking. How are the results reported? Your test results will be reported as values. Your health care provider  will compare your results to normal ranges that were established after testing a large group of people (reference ranges). Reference ranges may vary among labs and hospitals. For this test, common reference ranges are: Glucose  Umbilical cord: 50-35 mg/dL or 2.5-5.3 mmol/L (SI units).  Premature infant: 20-60 mg/dL or 1.1-3.3 mmol/L.  Neonate: 30-60 mg/dL or 1.7-3.3 mmol/L.  Infant: 40-90 mg/dL or 2.2-5.0 mmol/L.  Child younger than 2 years: 60-100 mg/dL or 3.3-5.5  mmol/L.  Adult or child older than 2 years: ? Fasting: 70-110 mg/dL or less than 6.1 mmol/L. ? Random (non-fasting): less than or equal to 200 mg/dL or less than 11.1 mmol/L. Creatinine  Child younger than 2 years: 0.1-0.4 mg/dL.  Child 42-82 years old: 0.2-0.5 mg/dL.  Child 61-36 years old: 0.3-0.6 mg/dL.  Child or adolescent 75-52 years old: 0.4-1.0 mg/dL.  Adult 2-53 years old: ? Female: 0.5-1.0 mg/dL. ? Female: 0.6-1.2 mg/dL.  Adult 76-62 years old: ? Female: 0.5-1.1 mg/dL. ? Female: 0.6-1.3 mg/dL.  Adult 34 years and older: ? Female: 0.5-1.2 mg/dL. ? Female: 0.7-1.3 mg/dL. BUN  Umbilical cord: 46-56 mg/dL.  Newborn: 3-12 mg/dL.  Infant: 5-18 mg/dL.  Child: 5-18 mg/dL.  Adult: 10-20 mg/dL or 3.6-7.1 mmol/L (SI units). Potassium (K+)  Newborn: 3.9-5.9 mEq/L.  Infant: 4.1-5.3 mEq/L.  Child: 3.4-4.7 mEq/L.  Adult or elderly: 3.5-5.0 mEq/L or 3.5-5.0 mmol/L (SI units). Sodium (Na+)  Newborn: 134-144 mEq/L.  Infant: 134-150 mEq/L.  Child: 136-145 mEq/L.  Adult or elderly: 136-145 mEq/L or 136-145 mmol/L (SI units). Chloride (Cl-)  Premature infant: 95-110 mEq/L.  Newborn: 96-106 mEq/L.  Child: 90-110 mEq/L.  Adult or elderly: 98-106 mEq/L or 98-106 mmol/L (SI units). Calcium (Ca)  Total calcium: ? Umbilical cord: 8.1-27.5 mg/dL or 2.25-2.88 mmol/L. ? Newborn less than 1 days old: 7.6-10.4 mg/dL or 1.9-2.60 mmol/L. ? Infant 65 days to 62 years old: 9.0-10.6 mg/dL or 2.3-2.65 mmol/L. ? Child: 8.8-10.8 mg/dL or 2.2-2.7 mmol/L. ? Adult: 9.0-10.5 mg/dL or 2.25-2.62 mmol/L.  Ionized calcium: ? Newborn: 4.20-5.58 mg/dL or 1.05-1.37 mmol/L. ? Child 2 months to 57 years old: 4.80-5.52 mg/dL or 1.20-1.38 mmol/L. ? Adult: 4.5-5.6 mg/dL or 1.05-1.30 mmol/L. Bicarbonate (HCO3-)  Newborn: 13-22 mEq/L.  Infant: 20-28 mEq/L.  Child: 20-28 mEq/L.  Adult or elderly: 23-30 mEq/L or 23-30 mmol/L (SI units). ALP  Child less than 2 years: 85-235 units/L.  7-59  years old: 65-210 units/L.  61-62 years old: 60-300 units/L.  49-37 years old: 30-200 units/L.  Adult: 30-120 units/L or 0.5-2.0 microkatals/L (SI units). ALT  Child or adult: 4-36 international units/L at 98.44F (37C) or 4-36 units/L (SI units). AST  Newborn 58-1 days old: 35-140 units/L.  Child less than 3 years: 15-60 units/L.  34-28 years old: 15-50 units/L.  66-49 years old: 10-50 units/L.  66-71 years old: 10-40 units/L.  Adult: 0-35 units/L or 0-0.58 microkatals/L (SI units). Bilirubin  Total bilirubin for newborn: 1.0-12.0 mg/dL or 17.1-205 micromoles/L (SI units).  Adult, elderly, or child: ? Total bilirubin: 0.3-1.0 mg/dL or 5.1-17 micromoles/L (SI units). ? Indirect bilirubin: 0.2-0.8 mg/dL or 3.4-12.0 micromoles/L (SI units). ? Direct bilirubin: 0.1-0.3 mg/dL or 1.7-5.1 micromoles/L (SI units). Albumin  Premature infant: 3.0-4.2 g/dL.  Newborn: 3.5-5.4 g/dL.  Infant: 4.4-5.4 g/dL.  Child: 4.0-5.9 g/dL.  Adult or elderly: 3.5-5.0 g/dL or 35-50 g/L (SI units). Total protein  Premature infant: 4.2-7.6 g/dL.  Newborn: 4.6-7.4 g/dL.  Infant: 6.0-6.7 g/dL.  Child: 6.2-8.0 g/dL.  Adult or elderly: 6.4-8.3 g/dL or 64-83 g/L (SI units). What do the results  mean? Diet and levels of activity can influence your test results. Sometimes they can be the cause of values that are outside of normal limits. However, sometimes values outside of normal limits can indicate a medical condition. Possible results for this test are: Glucose  Abnormally high glucose level (hyperglycemia). Possible causes of this include: ? Prediabetes mellitus. ? Diabetes mellitus. ? Severe stress caused by surgery, stroke, trauma. ? Overactive thyroid gland. ? Pancreatitis or pancreatic cancer.  Abnormally low glucose level (hypoglycemia). Possible causes include: ? Underactive thyroid gland. ? Insulin-secreting tumors, also called insulinoma (rare). Creatinine  Abnormally high  creatinine level. Possible causes include: ? Kidney failure. ? Overactive thyroid (hyperthyroidism). ? Abnormal production of growth hormone in the body (acromegaly or gigantism). ? Abnormal breakdown of muscle tissue (rhabdomyolysis). ? Early muscular dystrophy.  Abnormally low creatinine level. Possible causes include: ? Low muscle mass associated with malnutrition. ? Late-stage muscular dystrophy. BUN  Abnormally high BUN level. This generally means that your kidneys are not functioning normally, especially if levels are greater than 50 mg/dL.  Abnormally low BUN level. This may indicate malnutrition or liver failure. Potassium  Abnormally high potassium level (hyperkalemia). Possible causes include: ? Kidney disease. ? Massive destruction of red blood cells (hemolysis). ? Adrenal gland failure (Addison's disease).  Abnormally low potassium level (hypokalemia). Possible causes include: ? Excessive levels of the hormone aldosterone (hyperaldosteronism). Sodium  Abnormally high sodium level (hypernatremia). This can be caused by: ? Dehydration. ? Urination due to low levels of antidiuretic hormone (diabetes insipidus). ? Hyperaldosteronism. ? Excessive levels of cortisol in the body (Cushing's syndrome).  Abnormally low sodium level (hyponatremia). This can be caused by: ? Congestive heart failure. ? Cirrhosis of the liver. ? Kidney failure. ? Syndrome of inappropriate antidiuretic hormone (SIADH). Chloride  Abnormally high chloride level (hyperchloremia). This can be caused by: ? Acute kidney failure. ? Diabetes insipidus. ? Prolonged diarrhea. ? Poisoning with aspirin or bromide.  Abnormally low chloride level (hypochloremia). This can be caused by: ? Prolonged vomiting. ? Acute adrenal gland failure (Addisonian crisis). ? Hyperaldosteronism. ? SIADH. Calcium  Abnormally high calcium level (hypercalcemia). Possible causes include: ? Excessive activity of the  parathyroid glands (hyperparathyroidism). ? Certain cancers. ? Inflammation seen in sarcoidosis and tuberculosis.  Abnormally low calcium level (hypocalcemia). Possible causes include: ? Underactive parathyroid glands (hypoparathyroidism). ? Vitamin D deficiency. ? Acute pancreatitis. Bicarbonate  Abnormally high bicarbonate level. Possible causes include: ? Prolonged vomiting and diuretic therapy, which lead to a decrease in the amount of acid in the body (metabolic alkalosis). ? Conditions that increase the amount of bicarbonate in the body, including:  Hyperaldosteronism.  Rare hereditary disorders that interfere with how your kidneys handle electrolytes, such as Bartter syndrome.  Abnormally low bicarbonate level. Possible causes include: ? Conditions that cause your body to produce too much acid (metabolic acidosis). These include:  Uncontrolled diabetes mellitus.  Poisoning with aspirin, methanol, or antifreeze (ethylene glycol). ALP  Abnormally high level of ALP. This can be a sign of: ? Certain cancers or tumors. ? Liver disease. ? Hepatitis. ? Sarcoidosis. ? Rickets. ? Blocked bile duct. ? Bone problems such as a fracture.  Abnormally low level of ALP. This can be caused by: ? Malnutrition. ? Hypophosphatasia. ? Wilson's disease. ALT  Abnormally high level of ALT. This can indicate: ? Mononucleosis. ? Pancreatitis. ? Liver problems, such as hepatitis, cirrhosis, and liver cancer. AST  Abnormally high level of AST. This can indicate: ? Liver problems, such as hepatitis, cirrhosis,  and liver cancer. ? Mononucleosis. ? Pancreatitis. ? Muscle trauma. Bilirubin  Abnormally high level of bilirubin. This may be caused by: ? Liver problems, such as cirrhosis, liver disease, and hepatitis. ? Problems with the bile ducts, pancreas, or gallbladder. Albumin  Abnormally high albumin level. This may be caused by: ? Dehydration. ? Eating a high-protein  diet.  Abnormally low albumin level. This may be caused by: ? Eating a low-protein diet. ? Having had weight-loss surgery. ? Liver disease. ? Kidney disease. ? Crohn's disease. Total protein  Abnormally high total protein level. This may be caused by: ? Infections, such as hepatitis B, hepatitis C, or HIV. ? Multiple myeloma. ? Waldenstrm's disease.  Abnormally low total protein level. This may be caused by: ? Conditions such as malnutrition, severe burns, heavy bleeding, or liver disease. Talk with your health care provider about what your results mean. Questions to ask your health care provider Ask your health care provider, or the department that is doing the test:  When will my results be ready?  How will I get my results?  What are my treatment options?  What other tests do I need?  What are my next steps? Summary  Your health care provider may order the comprehensive metabolic panel (CMP) to check the levels of numerous substances in your blood.  A blood sample is required for this test. It is usually collected by inserting a needle into a blood vessel.  Your health care provider may ask you to not eat or drink anything for 6-8 hours before your blood sample is taken.  Sometimes values outside of normal limits can indicate a medical condition. This information is not intended to replace advice given to you by your health care provider. Make sure you discuss any questions you have with your health care provider. Document Revised: 05/16/2020 Document Reviewed: 05/16/2020 Elsevier Patient Education  2021 [employer].  Lipase Test Why am I having this test? The lipase test is often used to check for problems with the pancreas. Lipase is a protein (enzyme) that is released by the pancreas into the small intestine to help digest a type of fat called triglycerides. You may have this test if your health care provider suspects that you have damage to or an infection of  your pancreas (pancreatitis). These problems can cause your pancreas to release more lipase. The test is often done along with other blood tests. If you have abdominal pain, your health care provider may use this test to help figure out the cause of your pain. The test can help determine if the pain is related to your pancreas or to other causes. What is being tested? This test measures the amount of lipase in your blood. What kind of sample is taken? A blood sample is required for this test. It is usually collected by inserting a needle into a blood vessel.   How do I prepare for this test? Do not eat or drink anything except water for 8-12 hours before the test or as told by your health care provider. Tell your health care provider about all medicines you are taking, including vitamins, herbs, eye drops, creams, and over-the-counter medicines. How are the results reported? Your test results will be reported as values that indicate the amount of lipase in your blood. Your health care provider will compare your results to normal ranges that were established after testing a large group of people (reference ranges). Reference ranges may vary among labs and hospitals. For  this test, a common reference range is: 0-160 units/L or 0 to 2.67 microkatals/L in SI units. What do the results mean? Lipase levels that are higher than the reference range can be seen with many health conditions. These may include: Disease of the pancreas. Disease of the gallbladder. Kidney failure. Bowel obstruction or infarction, which means death of tissue due to a lack of blood supply. Swelling or infection of salivary glands. Peptic ulcer disease. Talk with your health care provider about what your results mean. Questions to ask your health care provider Ask your health care provider, or the department that is doing the test: When will my results be ready? How will I get my results? What are my treatment options? What  other tests do I need? What are my next steps? Summary The lipase test is often used to check for problems with the pancreas. Lipase is a protein (enzyme) that is released by the pancreas to help digest a type of fat called triglycerides. You may have this test if your health care provider suspects that you have damage to or an infection of your pancreas (pancreatitis). Pancreatitis can cause your pancreas to release more lipase. Lipase levels that are higher than normal can indicate other health conditions, too. Talk with your health care provider about what your results mean. This information is not intended to replace advice given to you by your health care provider. Make sure you discuss any questions you have with your health care provider. Document Revised: 09/26/2020 Document Reviewed: 09/26/2020 Elsevier Patient Education  [place].

## 2021-02-17 NOTE — Telephone Encounter (Signed)
[facility] called to receive verbal for omeprazole. CMA gave verbal for omeprazole 20 mg take one tablet two times daily for 180/0. Sent electronically as backup.   [name] 02/17/21 4:58 PM

## 2021-02-17 NOTE — Progress Notes (Addendum)
Subjective:  Patient ID: Rachel Hensley, female    DOB: August 14, 1956  Age: 65 y.o. MRN: [id number]  Chief Complaint  Patient presents with   Hospitalization Follow-up    RUQ pain POST COVID 3-4 weeks.    HPI  Rachel Hensley is a 65 year old Caucasian female that presents for hospital follow-up after an ED visit on 02/02/21 for RUQ pain and COVID-19. She tells me COVID-19 symptoms have subsided but she continues to experience intermittent RUQ pain and nausea. Lipase was elevated per labs. Abd [place] and CT Abd/pelvis with contrast negative. Rachel Hensley tells me she has a previous history of pancreatitis and "gallbladder problems". She has experienced symptoms of bloating, belching, GERD, diarrhea, and RUQ pain for several months. She states she was scheduled to undergo screening colonoscopy with Dr Charm Barges last month but had to cancel due to lack of transportation. She has declined referral to reschedule at this time.       Current Outpatient Medications on File Prior to Visit  Medication Sig Dispense Refill   aspirin 81 MG chewable tablet Chew 81 mg by mouth daily.     atorvastatin (LIPITOR) 20 MG tablet TAKE 1 TABLET DAILY 90 tablet 3   bisoprolol-hydrochlorothiazide (ZIAC) 5-6.25 MG tablet Take 1 tablet by mouth daily. 90 tablet 1   cetirizine (ZYRTEC) 10 MG tablet Take 10 mg by mouth daily.     Dermatological Products, Misc. [facility]) lotion APPLY TO THE AFFECTED AREA(S) ON ON THE LEG TWICE DAILY     Dulaglutide (TRULICITY) 4.5 MG/0.5ML SOPN Inject 4.5 mg as directed once a week. 6 mL 0   EPINEPHrine 0.3 mg/0.3 mL IJ SOAJ injection Inject 0.3 mg into the muscle once.     gabapentin (NEURONTIN) 100 MG capsule Take 1 capsule (100 mg total) by mouth 3 (three) times daily. 90 capsule 0   glimepiride (AMARYL) 4 MG tablet Take 4 mg by mouth 2 (two) times daily.     meloxicam (MOBIC) 15 MG tablet Take 1 tablet (15 mg total) by mouth daily. 30 tablet 0   SYNJARDY XR 12.04-999 MG TB24 TAKE 1 TABLET  TWICE A DAY 180 tablet 1   triamcinolone cream (KENALOG) 0.1 % Apply 1 application topically 2 (two) times daily.     No current facility-administered medications on file prior to visit.   Past Medical History:  Diagnosis Date   Mixed hyperlipidemia    Nontoxic multinodular goiter    Other vitamin B12 deficiency anemias    Solitary pulmonary nodule    Type 2 diabetes mellitus with other specified complication [facility])    Past Surgical History:  Procedure Laterality Date   ABDOMINAL HYSTERECTOMY  06/1993   partial; still has both ovaries   HYSTEROTOMY     partial    Family History  Problem Relation Age of Onset   Eczema Grandchild    Atopy Grandchild    Asthma Son        childhood asthma   Coronary artery disease Mother    Glaucoma Mother    Colon cancer Father    Kidney cancer Brother    Diabetes type II Brother    Allergic rhinitis Neg Hx    Angioedema Neg Hx    Immunodeficiency Neg Hx    Urticaria Neg Hx    Social History   Socioeconomic History   Marital status: Married    Spouse name: Not on file   Number of children: 3   Years of education: Not on file   Highest education  level: Not on file  Occupational History   Occupation: Sewer  Tobacco Use   Smoking status: Never Smoker   Smokeless tobacco: Never Used  Building services engineer Use: Never used  Substance and Sexual Activity   Alcohol use: Never   Drug use: Never   Sexual activity: Not on file  Other Topics Concern   Not on file  Social History Narrative   Not on file   Social Determinants of Health   Financial Resource Strain: Not on file  Food Insecurity: Not on file  Transportation Needs: Not on file  Physical Activity: Not on file  Stress: Not on file  Social Connections: Not on file     Review of Systems  Constitutional: Negative for fatigue and fever.  HENT: Negative for congestion, ear pain, sinus pressure and sore throat.   Eyes: Negative for pain.   Respiratory: Negative for cough, chest tightness, shortness of breath and wheezing.   Cardiovascular: Negative for chest pain and palpitations.  Gastrointestinal: Positive for abdominal pain (RUQ), diarrhea, nausea and vomiting. Negative for constipation.       Bloating, belching, and GERD  Endocrine: Negative for cold intolerance, heat intolerance, polydipsia, polyphagia and polyuria.  Genitourinary: Negative for dysuria and hematuria.  Musculoskeletal: Negative for arthralgias, back pain, joint swelling and myalgias.  Skin: Negative for rash.  Neurological: Negative for dizziness, weakness and headaches.  Psychiatric/Behavioral: Negative for dysphoric mood. The patient is not nervous/anxious.      Objective:  BP 112/76 (BP Location: Left Arm, Patient Position: Sitting)    Pulse 83    Temp 98.1 F (36.7 C) (Temporal)    Ht 5\' 4"  (1.626 m)    Wt 137 lb (62.1 kg)    SpO2 99%    BMI 23.52 kg/m   BP/Weight 02/17/2021 11/26/2020 09/20/2020  Systolic BP 112 110 124  Diastolic BP 76 70 76  Wt. (Lbs) 137 141 142  BMI 23.52 24.2 25.56    Physical Exam Vitals reviewed.  Constitutional:      Appearance: Normal appearance.  HENT:     Head: Normocephalic.     Right Ear: Tympanic membrane, ear canal and external ear normal.     Left Ear: Tympanic membrane, ear canal and external ear normal.     Nose: Nose normal.     Mouth/Throat:     Mouth: Mucous membranes are moist.  Cardiovascular:     Rate and Rhythm: Normal rate and regular rhythm.     Pulses: Normal pulses.     Heart sounds: Normal heart sounds.  Pulmonary:     Effort: Pulmonary effort is normal.     Breath sounds: Normal breath sounds.  Abdominal:     General: Bowel sounds are normal.     Palpations: Abdomen is soft.     Tenderness: There is abdominal tenderness (RUQ).  Musculoskeletal:     Cervical back: Neck supple.  Skin:    General: Skin is warm and dry.     Capillary Refill: Capillary refill takes less than 2  seconds.  Neurological:     General: No focal deficit present.     Mental Status: She is alert and oriented to person, place, and time.  Psychiatric:        Mood and Affect: Mood normal.        Behavior: Behavior normal.     Diabetic Foot Exam - Simple   No data filed      Lab Results  Component Value Date  WBC 7.3 02/17/2021   HGB 13.3 02/17/2021   HCT 40.8 02/17/2021   PLT 378 02/17/2021   GLUCOSE 169 (H) 02/17/2021   CHOL 155 11/26/2020   TRIG 113 11/26/2020   HDL 54 11/26/2020   LDLCALC 81 11/26/2020   ALT 23 02/17/2021   AST 16 02/17/2021   NA 136 02/17/2021   K 4.2 02/17/2021   CL 97 02/17/2021   CREATININE 0.72 02/17/2021   BUN 21 02/17/2021   CO2 22 02/17/2021   HGBA1C 6.8 (H) 11/26/2020   MICROALBUR 150 11/26/2020      Assessment & Plan:   1. Epigastric abdominal tenderness without rebound tenderness - Lipase - Comprehensive metabolic panel - Amylase - CBC with Differential  2. Elevated lipase - Lipase  3. Diarrhea, unspecified type - NM Hepato W/Eject Fract  4. Non-intractable vomiting with nausea, unspecified vomiting type - ondansetron (ZOFRAN ODT) 4 MG disintegrating tablet; Take 1 tablet (4 mg total) by mouth every 8 (eight) hours as needed for nausea or vomiting.  Dispense: 30 tablet; Refill: 0 - promethazine (PHENERGAN) 25 MG tablet; Take 1 tablet (25 mg total) by mouth every 8 (eight) hours as needed for nausea or vomiting.  Dispense: 20 tablet; Refill: 0       Increase Omeprazole to 40 mg daily Follow-up with Dr Charm Barges for screening colonoscopy Gallbladder diet We will call you with appt for HIDA scan (gallbladder functioning test) and lab results Zofran 4 mg as needed for nausea and vomiting Phenergan 25 mg as needed for nausea and vomiting   Follow-up: 02/25/21 with Dr Sedalia Muta as scheduled  An After Visit Summary was printed and given to the patient.  Janie Morning, NP [facility] [phone]

## 2021-02-18 LAB — COMPREHENSIVE METABOLIC PANEL
ALT: 23 IU/L (ref 0–32)
AST: 16 IU/L (ref 0–40)
Albumin/Globulin Ratio: 1.4 (ref 1.2–2.2)
Albumin: 4.3 g/dL (ref 3.8–4.8)
Alkaline Phosphatase: 90 IU/L (ref 44–121)
BUN/Creatinine Ratio: 29 — ABNORMAL HIGH (ref 12–28)
BUN: 21 mg/dL (ref 8–27)
Bilirubin Total: 0.4 mg/dL (ref 0.0–1.2)
CO2: 22 mmol/L (ref 20–29)
Calcium: 10 mg/dL (ref 8.7–10.3)
Chloride: 97 mmol/L (ref 96–106)
Creatinine, Ser: 0.72 mg/dL (ref 0.57–1.00)
GFR calc Af Amer: 101 mL/min/{1.73_m2} (ref >59)
GFR calc non Af Amer: 88 mL/min/{1.73_m2} (ref >59)
Globulin, Total: 3 g/dL (ref 1.5–4.5)
Glucose: 169 mg/dL — ABNORMAL HIGH (ref 65–99)
Potassium: 4.2 mmol/L (ref 3.5–5.2)
Sodium: 136 mmol/L (ref 134–144)
Total Protein: 7.3 g/dL (ref 6.0–8.5)

## 2021-02-18 LAB — AMYLASE: Amylase: 47 U/L (ref 31–110)

## 2021-02-18 LAB — LIPASE: Lipase: 88 U/L — ABNORMAL HIGH (ref 14–72)

## 2021-02-25 ENCOUNTER — Ambulatory Visit: Payer: Commercial Managed Care - PPO | Admitting: Family Medicine

## 2021-02-26 ENCOUNTER — Other Ambulatory Visit: Payer: Self-pay | Admitting: Nurse Practitioner

## 2021-02-26 DIAGNOSIS — K219 Gastro-esophageal reflux disease without esophagitis: Secondary | ICD-10-CM

## 2021-02-26 DIAGNOSIS — R10816 Epigastric abdominal tenderness: Secondary | ICD-10-CM

## 2021-02-26 DIAGNOSIS — R112 Nausea with vomiting, unspecified: Secondary | ICD-10-CM

## 2021-03-03 ENCOUNTER — Other Ambulatory Visit: Payer: Self-pay | Admitting: Family Medicine

## 2021-03-05 NOTE — Progress Notes (Signed)
Subjective:  Patient ID: Rachel Hensley, female    DOB: 04/08/1956  Age: 65 y.o. MRN: [id number]  Chief Complaint  Patient presents with  . Diabetes  . Hyperlipidemia  . Hypertension    HPI GERD without esophagitis Takes omeprazole 20 mg twice a day.   Diabetic glomerulopathy (HCC) Controlled with trulicity 4.5 weekly and synjardy xr 2 daily.. Sugars running 120-130s. Annual eye exam (2021.) Checking feet daily for sores. No numbness in feet.   Essential hypertension, benign Currently taking bisoprolol-hctz  Mixed hyperlipidemia Taking lipitor daily, eating healthy  Pt is having constant abdominal pain (RUQ.) Had been intermittent until last week of January and then became more constant.  Positive covid 19 at home. Pt was seen at urgent care. Gallbladder US and biliary scan were both normal.  Sometimes food triggers it, but sometimes not. Gets full quickly.  Bowels: has ibs (dxd 2005) and either has constipation or diarrhea, but constipation more often. Decreased water intake. Feels too full and is uncomfortable. Pain awakens her from sleep.  Patient is due for colonoscopy. Scheduled to see Dr. Charm Barges in April.  Given zofran and phenergan. Takes sporadically for nausea.  NSAIDS not helping, so stopped taking them. Tylenol does not help either.   IBS: More constipation than diarrhea.  Current Outpatient Medications on File Prior to Visit  Medication Sig Dispense Refill  . aspirin 81 MG chewable tablet Chew 81 mg by mouth daily.    Marland Kitchen atorvastatin (LIPITOR) 20 MG tablet TAKE 1 TABLET DAILY 90 tablet 3  . bisoprolol-hydrochlorothiazide (ZIAC) 5-6.25 MG tablet TAKE 1 TABLET DAILY 90 tablet 3  . cetirizine (ZYRTEC) 10 MG tablet Take 10 mg by mouth daily.    . Dermatological Products, Misc. [facility]) lotion APPLY TO THE AFFECTED AREA(S) ON ON THE LEG TWICE DAILY    . Dulaglutide (TRULICITY) 4.5 MG/0.5ML SOPN Inject 4.5 mg as directed once a week. 6 mL 0  . EPINEPHrine 0.3  mg/0.3 mL IJ SOAJ injection Inject 0.3 mg into the muscle once.    Marland Kitchen omeprazole (PRILOSEC) 20 MG capsule Take 1 capsule (20 mg total) by mouth 2 (two) times daily. 180 capsule 0  . ondansetron (ZOFRAN ODT) 4 MG disintegrating tablet Take 1 tablet (4 mg total) by mouth every 8 (eight) hours as needed for nausea or vomiting. 30 tablet 0  . promethazine (PHENERGAN) 25 MG tablet Take 1 tablet (25 mg total) by mouth every 8 (eight) hours as needed for nausea or vomiting. 20 tablet 0  . SYNJARDY XR 12.04-999 MG TB24 TAKE 1 TABLET TWICE A DAY 180 tablet 1  . triamcinolone cream (KENALOG) 0.1 % Apply 1 application topically 2 (two) times daily.     No current facility-administered medications on file prior to visit.   Past Medical History:  Diagnosis Date  . Mixed hyperlipidemia   . Nontoxic multinodular goiter   . Other vitamin B12 deficiency anemias   . Solitary pulmonary nodule   . Type 2 diabetes mellitus with other specified complication [facility])    Past Surgical History:  Procedure Laterality Date  . ABDOMINAL HYSTERECTOMY  06/1993   partial; still has both ovaries  . HYSTEROTOMY     partial    Family History  Problem Relation Age of Onset  . Eczema Grandchild   . Atopy Grandchild   . Asthma Son        childhood asthma  . Coronary artery disease Mother   . Glaucoma Mother   . Colon cancer Father   .  Kidney cancer Brother   . Diabetes type II Brother   . Allergic rhinitis Neg Hx   . Angioedema Neg Hx   . Immunodeficiency Neg Hx   . Urticaria Neg Hx    Social History   Socioeconomic History  . Marital status: Married    Spouse name: Not on file  . Number of children: 3  . Years of education: Not on file  . Highest education level: Not on file  Occupational History  . Occupation: Geographical information systems officer  Tobacco Use  . Smoking status: Never Smoker  . Smokeless tobacco: Never Used  Vaping Use  . Vaping Use: Never used  Substance and Sexual Activity  . Alcohol use: Never  . Drug use:  Never  . Sexual activity: Not on file  Other Topics Concern  . Not on file  Social History Narrative  . Not on file   Social Determinants of Health   Financial Resource Strain: Not on file  Food Insecurity: Not on file  Transportation Needs: Not on file  Physical Activity: Not on file  Stress: Not on file  Social Connections: Not on file    Review of Systems  Constitutional: Negative for chills, fatigue and fever.  HENT: Negative for congestion, ear pain, rhinorrhea and sore throat.   Respiratory: Negative for cough and shortness of breath.   Cardiovascular: Negative for chest pain.  Gastrointestinal: Negative for abdominal pain, constipation, diarrhea, nausea and vomiting.  Genitourinary: Negative for dysuria and urgency.  Musculoskeletal: Negative for back pain and myalgias.  Neurological: Negative for dizziness, weakness, light-headedness and headaches.  Psychiatric/Behavioral: Negative for dysphoric mood. The patient is not nervous/anxious.      Objective:  BP 128/68 (BP Location: Left Arm, Patient Position: Sitting)   Pulse 77   Temp (!) 97.5 F (36.4 C) (Temporal)   Ht 5\' 4"  (1.626 m)   Wt 138 lb (62.6 kg)   SpO2 99%   BMI 23.69 kg/m   BP/Weight 03/06/2021 02/17/2021 11/26/2020  Systolic BP 128 112 110  Diastolic BP 68 76 70  Wt. (Lbs) 138 137 141  BMI 23.69 23.52 24.2    Physical Exam Vitals reviewed.  Constitutional:      Appearance: Normal appearance. She is normal weight.  Neck:     Vascular: No carotid bruit.  Cardiovascular:     Rate and Rhythm: Normal rate and regular rhythm.     Pulses: Normal pulses.     Heart sounds: Normal heart sounds.  Pulmonary:     Effort: Pulmonary effort is normal. No respiratory distress.     Breath sounds: Normal breath sounds.  Abdominal:     General: Abdomen is flat. Bowel sounds are normal.     Palpations: Abdomen is soft.     Tenderness: There is no abdominal tenderness.  Neurological:     Mental Status: She  is alert and oriented to person, place, and time.  Psychiatric:        Mood and Affect: Mood normal.        Behavior: Behavior normal.     Diabetic Foot Exam - Simple   Simple Foot Form Diabetic Foot exam was performed with the following findings: Yes 03/06/2021  9:32 AM  Visual Inspection No deformities, no ulcerations, no other skin breakdown bilaterally: Yes Sensation Testing Intact to touch and monofilament testing bilaterally: Yes Pulse Check Posterior Tibialis and Dorsalis pulse intact bilaterally: Yes Comments      Lab Results  Component Value Date   WBC 7.3 02/17/2021  HGB 13.3 02/17/2021   HCT 40.8 02/17/2021   PLT 378 02/17/2021   GLUCOSE 169 (H) 02/17/2021   CHOL 155 11/26/2020   TRIG 113 11/26/2020   HDL 54 11/26/2020   LDLCALC 81 11/26/2020   ALT 23 02/17/2021   AST 16 02/17/2021   NA 136 02/17/2021   K 4.2 02/17/2021   CL 97 02/17/2021   CREATININE 0.72 02/17/2021   BUN 21 02/17/2021   CO2 22 02/17/2021   HGBA1C 6.8 (H) 11/26/2020   MICROALBUR 80 03/06/2021      Assessment & Plan:   1. RUQ abdominal pain I am concerned this may be related to ibs.  Canceled CT scan of abdomen and pelvis.  Patient has had 1 early February in the emergency department which was normal.  Gallbladder work-up has been negative.  We did call Dr. Silvana Newness office to try to move the patient's appointment up.  She is currently on a wait list. Differential diagnosis: IBS versus possibly drug reaction from Trulicity although she was on the higher dose for 2 months prior to the symptoms beginning.  Patient is apparently due for colonoscopy anyways.  2. Irritable bowel syndrome with both constipation and diarrhea Trial on Linzess 290 mcg once daily in a.m.  Patient to let us know if this is helped significantly next week. - linaclotide (LINZESS) 290 MCG CAPS capsule; Take 1 capsule (290 mcg total) by mouth daily before breakfast.  Dispense: 12 capsule; Refill: 0  3. GERD without  esophagitis Continue current medications.  4. Diabetic glomerulopathy (HCC) Recommend continue to work on eating healthy diet and exercise. Control: good Recommend check sugars fasting daily. Recommend check feet daily. Recommend annual eye exams. Medicines: no changes at this time. Continue to work on eating a healthy diet and exercise.  Labs drawn today.   - CBC with Differential/Platelet - Hemoglobin A1c - POCT urinalysis dipstick normal - POCT UA - Microalbumin  80. 24 hour urine done in the fall was normal. Pt had angioedema to ace inhibitor. On jardiance.  5. Essential hypertension, benign The current medical regimen is effective;  continue present plan and medications. - Comprehensive metabolic panel  6. Mixed hyperlipidemia Well controlled.  No changes to medicines.  Continue to work on eating a healthy diet and exercise.  Labs drawn today.  - Lipid panel     Meds ordered this encounter  Medications  . linaclotide (LINZESS) 290 MCG CAPS capsule    Sig: Take 1 capsule (290 mcg total) by mouth daily before breakfast.    Dispense:  12 capsule    Refill:  0    Orders Placed This Encounter  Procedures  . CBC with Differential/Platelet  . Comprehensive metabolic panel  . Hemoglobin A1c  . Lipid panel  . POCT urinalysis dipstick  . POCT UA - Microalbumin     Follow-up: Return in about 3 months (around 06/06/2021) for fasting.  An After Visit Summary was printed and given to the patient.  Blane Ohara, MD [facility] [phone]

## 2021-03-06 ENCOUNTER — Encounter: Payer: Self-pay | Admitting: Family Medicine

## 2021-03-06 ENCOUNTER — Other Ambulatory Visit: Payer: Self-pay

## 2021-03-06 ENCOUNTER — Ambulatory Visit (INDEPENDENT_AMBULATORY_CARE_PROVIDER_SITE_OTHER): Payer: Managed Care, Other (non HMO) | Admitting: Family Medicine

## 2021-03-06 VITALS — BP 128/68 | HR 77 | Temp 97.5°F | Ht 64.0 in | Wt 138.0 lb

## 2021-03-06 DIAGNOSIS — R1011 Right upper quadrant pain: Secondary | ICD-10-CM | POA: Diagnosis not present

## 2021-03-06 DIAGNOSIS — K219 Gastro-esophageal reflux disease without esophagitis: Secondary | ICD-10-CM | POA: Diagnosis not present

## 2021-03-06 DIAGNOSIS — E1121 Type 2 diabetes mellitus with diabetic nephropathy: Secondary | ICD-10-CM | POA: Diagnosis not present

## 2021-03-06 DIAGNOSIS — E782 Mixed hyperlipidemia: Secondary | ICD-10-CM

## 2021-03-06 DIAGNOSIS — I10 Essential (primary) hypertension: Secondary | ICD-10-CM

## 2021-03-06 DIAGNOSIS — F5101 Primary insomnia: Secondary | ICD-10-CM

## 2021-03-06 DIAGNOSIS — K582 Mixed irritable bowel syndrome: Secondary | ICD-10-CM

## 2021-03-06 LAB — POCT URINALYSIS DIPSTICK
Bilirubin, UA: NEGATIVE
Blood, UA: NEGATIVE
Glucose, UA: POSITIVE — AB
Ketones, UA: NEGATIVE
Leukocytes, UA: NEGATIVE
Nitrite, UA: NEGATIVE
Protein, UA: POSITIVE — AB
Spec Grav, UA: 1.02 (ref 1.010–1.025)
Urobilinogen, UA: NEGATIVE E.U./dL — AB
pH, UA: 6 (ref 5.0–8.0)

## 2021-03-06 LAB — POCT UA - MICROALBUMIN
Creatinine, POC: 100 mg/dL
Microalbumin Ur, POC: 80 mg/L

## 2021-03-06 MED ORDER — LINACLOTIDE 290 MCG PO CAPS: 290.0000 ug | ORAL_CAPSULE | Freq: Every day | ORAL | 0 refills | Status: DC

## 2021-03-06 NOTE — Patient Instructions (Signed)
Cancel ct scan of abdomen.  Start on linzess 290 once daily in am.  Call back next week and let us know how you are doing.

## 2021-03-07 LAB — LIPID PANEL
Chol/HDL Ratio: 3.2 ratio (ref 0.0–4.4)
Cholesterol, Total: 154 mg/dL (ref 100–199)
HDL: 48 mg/dL (ref >39)
LDL Chol Calc (NIH): 81 mg/dL (ref 0–99)
Triglycerides: 144 mg/dL (ref 0–149)
VLDL Cholesterol Cal: 25 mg/dL (ref 5–40)

## 2021-03-07 LAB — CBC WITH DIFFERENTIAL/PLATELET
Basophils Absolute: 0 10*3/uL (ref 0.0–0.2)
Basos: 0 %
EOS (ABSOLUTE): 0.2 10*3/uL (ref 0.0–0.4)
Eos: 2 %
Hematocrit: 40.8 % (ref 34.0–46.6)
Hemoglobin: 13.3 g/dL (ref 11.1–15.9)
Immature Grans (Abs): 0 10*3/uL (ref 0.0–0.1)
Immature Granulocytes: 0 %
Lymphocytes Absolute: 3.5 10*3/uL — ABNORMAL HIGH (ref 0.7–3.1)
Lymphs: 36 %
MCH: 27.5 pg (ref 26.6–33.0)
MCHC: 32.6 g/dL (ref 31.5–35.7)
MCV: 85 fL (ref 79–97)
Monocytes Absolute: 0.7 10*3/uL (ref 0.1–0.9)
Monocytes: 7 %
Neutrophils Absolute: 5.2 10*3/uL (ref 1.4–7.0)
Neutrophils: 55 %
Platelets: 358 10*3/uL (ref 150–450)
RBC: 4.83 x10E6/uL (ref 3.77–5.28)
RDW: 15.9 % — ABNORMAL HIGH (ref 11.7–15.4)
WBC: 9.7 10*3/uL (ref 3.4–10.8)

## 2021-03-07 LAB — COMPREHENSIVE METABOLIC PANEL
ALT: 18 IU/L (ref 0–32)
AST: 13 IU/L (ref 0–40)
Albumin/Globulin Ratio: 1.4 (ref 1.2–2.2)
Albumin: 4.3 g/dL (ref 3.8–4.8)
Alkaline Phosphatase: 85 IU/L (ref 44–121)
BUN/Creatinine Ratio: 29 — ABNORMAL HIGH (ref 12–28)
BUN: 19 mg/dL (ref 8–27)
Bilirubin Total: 0.4 mg/dL (ref 0.0–1.2)
CO2: 26 mmol/L (ref 20–29)
Calcium: 10.1 mg/dL (ref 8.7–10.3)
Chloride: 98 mmol/L (ref 96–106)
Creatinine, Ser: 0.65 mg/dL (ref 0.57–1.00)
Globulin, Total: 3.1 g/dL (ref 1.5–4.5)
Glucose: 116 mg/dL — ABNORMAL HIGH (ref 65–99)
Potassium: 4.6 mmol/L (ref 3.5–5.2)
Sodium: 140 mmol/L (ref 134–144)
Total Protein: 7.4 g/dL (ref 6.0–8.5)
eGFR: 97 mL/min/{1.73_m2} (ref >59)

## 2021-03-07 LAB — CARDIOVASCULAR RISK ASSESSMENT

## 2021-03-07 LAB — HEMOGLOBIN A1C
Est. average glucose Bld gHb Est-mCnc: 151 mg/dL
Hgb A1c MFr Bld: 6.9 % — ABNORMAL HIGH (ref 4.8–5.6)

## 2021-03-10 ENCOUNTER — Other Ambulatory Visit: Payer: Self-pay | Admitting: Family Medicine

## 2021-03-31 LAB — HM COLONOSCOPY

## 2021-06-13 ENCOUNTER — Ambulatory Visit: Payer: Managed Care, Other (non HMO) | Admitting: Family Medicine

## 2021-06-17 ENCOUNTER — Encounter: Payer: Self-pay | Admitting: Physician Assistant

## 2021-06-17 ENCOUNTER — Ambulatory Visit: Payer: Medicare Other | Admitting: Physician Assistant

## 2021-06-17 ENCOUNTER — Other Ambulatory Visit: Payer: Self-pay

## 2021-06-17 VITALS — BP 120/68 | HR 83 | Temp 97.2°F | Ht 64.0 in | Wt 136.8 lb

## 2021-06-17 DIAGNOSIS — M25461 Effusion, right knee: Secondary | ICD-10-CM | POA: Diagnosis not present

## 2021-06-17 NOTE — Progress Notes (Signed)
Acute Office Visit  Subjective:    Patient ID: Rachel Hensley, female    DOB: [birth year], 65 y.o.   MRN: [id number]  Chief Complaint  Patient presents with   Knee Pain    Right    HPI Patient is in today for right knee pain - pt has a history of right knee stress fracture last fall and was treated by orthopedics - had worn a brace for several weeks and it got better but was still having some issues with it On Saturday she went to stand up after using the restroom felt and heard a large pop in her knee and now is not able to bend completely and knee is swollen She would like referral back to ortho  Past Medical History:  Diagnosis Date   Mixed hyperlipidemia    Nontoxic multinodular goiter    Other vitamin B12 deficiency anemias    Solitary pulmonary nodule    Type 2 diabetes mellitus with other specified complication [facility])     Past Surgical History:  Procedure Laterality Date   ABDOMINAL HYSTERECTOMY  06/1993   partial; still has both ovaries   HYSTEROTOMY     partial    Family History  Problem Relation Age of Onset   Eczema Grandchild    Atopy Grandchild    Asthma Son        childhood asthma   Coronary artery disease Mother    Glaucoma Mother    Colon cancer Father    Kidney cancer Brother    Diabetes type II Brother    Allergic rhinitis Neg Hx    Angioedema Neg Hx    Immunodeficiency Neg Hx    Urticaria Neg Hx     Social History   Socioeconomic History   Marital status: Married    Spouse name: Not on file   Number of children: 3   Years of education: Not on file   Highest education level: Not on file  Occupational History   Occupation: Insurance account manager  Tobacco Use   Smoking status: Never   Smokeless tobacco: Never  Vaping Use   Vaping Use: Never used  Substance and Sexual Activity   Alcohol use: Never   Drug use: Never   Sexual activity: Not on file  Other Topics Concern   Not on file  Social History Narrative   Not on file   Social Determinants  of Health   Financial Resource Strain: Not on file  Food Insecurity: Not on file  Transportation Needs: Not on file  Physical Activity: Not on file  Stress: Not on file  Social Connections: Not on file  Intimate Partner Violence: Not on file    Outpatient Medications Prior to Visit  Medication Sig Dispense Refill   aspirin 81 MG chewable tablet Chew 81 mg by mouth daily.     atorvastatin (LIPITOR) 20 MG tablet TAKE 1 TABLET DAILY 90 tablet 3   bisoprolol-hydrochlorothiazide (ZIAC) 5-6.25 MG tablet TAKE 1 TABLET DAILY 90 tablet 3   cetirizine (ZYRTEC) 10 MG tablet Take 10 mg by mouth daily.     Dermatological Products, Misc. [facility]) lotion APPLY TO THE AFFECTED AREA(S) ON ON THE LEG TWICE DAILY     EPINEPHrine 0.3 mg/0.3 mL IJ SOAJ injection Inject 0.3 mg into the muscle once.     linaclotide (LINZESS) 290 MCG CAPS capsule Take 1 capsule (290 mcg total) by mouth daily before breakfast. 12 capsule 0   omeprazole (PRILOSEC) 20 MG capsule Take 1 capsule (20  mg total) by mouth 2 (two) times daily. 180 capsule 0   ondansetron (ZOFRAN ODT) 4 MG disintegrating tablet Take 1 tablet (4 mg total) by mouth every 8 (eight) hours as needed for nausea or vomiting. 30 tablet 0   promethazine (PHENERGAN) 25 MG tablet Take 1 tablet (25 mg total) by mouth every 8 (eight) hours as needed for nausea or vomiting. 20 tablet 0   SYNJARDY XR 12.04-999 MG TB24 TAKE 1 TABLET TWICE A DAY 180 tablet 1   triamcinolone cream (KENALOG) 0.1 % Apply 1 application topically 2 (two) times daily.     TRULICITY 4.5 IR/4.4RX SOPN INJECT 4.5 MG AS DIRECTED ONCE A WEEK 6 mL 3   No facility-administered medications prior to visit.    Allergies  Allergen Reactions   Actos [Pioglitazone] Other (See Comments)    Weight gain/ edema   Lisinopril Other (See Comments)    angioedema   Prednisone Swelling    Review of Systems CONSTITUTIONAL: Negative for chills, fatigue, fever, unintentional weight gain and unintentional  weight loss.  CARDIOVASCULAR: Negative for chest pain, dizziness, palpitations and pedal edema.  RESPIRATORY: Negative for recent cough and dyspnea.  EXT - see HPI INTEGUMENTARY: Negative for rash.          Objective:    Physical Exam PHYSICAL EXAM:   VS: BP 120/68 (BP Location: Right Arm, Patient Position: Sitting, Cuff Size: Normal)   Pulse 83   Temp (!) 97.2 F (36.2 C) (Temporal)   Ht 5' 4"  (1.626 m)   Wt 136 lb 12.8 oz (62.1 kg)   SpO2 99%   BMI 23.48 kg/m   GEN: Well nourished, well developed, in no acute distress  Cardiac: RRR; no murmurs, rubs, or gallops, Respiratory:  normal respiratory rate and pattern with no distress - normal breath sounds with no rales, rhonchi, wheezes or rubs MS: right knee swollen and tender to palpation Skin: warm and dry, no rash    BP 120/68 (BP Location: Right Arm, Patient Position: Sitting, Cuff Size: Normal)   Pulse 83   Temp (!) 97.2 F (36.2 C) (Temporal)   Ht 5' 4"  (1.626 m)   Wt 136 lb 12.8 oz (62.1 kg)   SpO2 99%   BMI 23.48 kg/m  Wt Readings from Last 3 Encounters:  06/17/21 136 lb 12.8 oz (62.1 kg)  03/06/21 138 lb (62.6 kg)  02/17/21 137 lb (62.1 kg)    Health Maintenance Due  Topic Date Due   COVID-19 Vaccine (1) Never done   Hepatitis C Screening  Never done   TETANUS/TDAP  Never done   Fecal DNA (Cologuard)  Never done   Zoster Vaccines- Shingrix (1 of 2) Never done   OPHTHALMOLOGY EXAM  12/29/2019   PNA vac Low Risk Adult (2 of 2 - PCV13) 02/18/2021    There are no preventive care reminders to display for this patient.   No results found for: TSH Lab Results  Component Value Date   WBC 9.7 03/06/2021   HGB 13.3 03/06/2021   HCT 40.8 03/06/2021   MCV 85 03/06/2021   PLT 358 03/06/2021   Lab Results  Component Value Date   NA 140 03/06/2021   K 4.6 03/06/2021   CO2 26 03/06/2021   GLUCOSE 116 (H) 03/06/2021   BUN 19 03/06/2021   CREATININE 0.65 03/06/2021   BILITOT 0.4 03/06/2021   ALKPHOS  85 03/06/2021   AST 13 03/06/2021   ALT 18 03/06/2021   PROT 7.4 03/06/2021  ALBUMIN 4.3 03/06/2021   CALCIUM 10.1 03/06/2021   EGFR 97 03/06/2021   Lab Results  Component Value Date   CHOL 154 03/06/2021   Lab Results  Component Value Date   HDL 48 03/06/2021   Lab Results  Component Value Date   LDLCALC 81 03/06/2021   Lab Results  Component Value Date   TRIG 144 03/06/2021   Lab Results  Component Value Date   CHOLHDL 3.2 03/06/2021   Lab Results  Component Value Date   HGBA1C 6.9 (H) 03/06/2021       Assessment & Plan:  1. Knee effusion, right  Referral given to see ortho - tomorrow at 4pm  No orders of the defined types were placed in this encounter.   No orders of the defined types were placed in this encounter.     Follow-up: Return if symptoms worsen or fail to improve.  An After Visit Summary was printed and given to the patient.  Yetta Flock [facility] [phone]

## 2021-06-18 ENCOUNTER — Ambulatory Visit: Payer: Managed Care, Other (non HMO) | Admitting: Legal Medicine

## 2021-06-26 ENCOUNTER — Ambulatory Visit: Payer: Managed Care, Other (non HMO) | Admitting: Family Medicine

## 2021-07-03 ENCOUNTER — Other Ambulatory Visit: Payer: Medicare Other

## 2021-07-03 ENCOUNTER — Other Ambulatory Visit: Payer: Self-pay

## 2021-07-03 DIAGNOSIS — E1121 Type 2 diabetes mellitus with diabetic nephropathy: Secondary | ICD-10-CM

## 2021-07-03 DIAGNOSIS — I10 Essential (primary) hypertension: Secondary | ICD-10-CM

## 2021-07-03 DIAGNOSIS — E782 Mixed hyperlipidemia: Secondary | ICD-10-CM

## 2021-07-04 LAB — COMPREHENSIVE METABOLIC PANEL
ALT: 16 IU/L (ref 0–32)
AST: 10 IU/L (ref 0–40)
Albumin/Globulin Ratio: 1.5 (ref 1.2–2.2)
Albumin: 4.2 g/dL (ref 3.8–4.8)
Alkaline Phosphatase: 85 IU/L (ref 44–121)
BUN/Creatinine Ratio: 31 — ABNORMAL HIGH (ref 12–28)
BUN: 19 mg/dL (ref 8–27)
Bilirubin Total: <0.2 mg/dL (ref 0.0–1.2)
CO2: 26 mmol/L (ref 20–29)
Calcium: 9.8 mg/dL (ref 8.7–10.3)
Chloride: 105 mmol/L (ref 96–106)
Creatinine, Ser: 0.61 mg/dL (ref 0.57–1.00)
Globulin, Total: 2.8 g/dL (ref 1.5–4.5)
Glucose: 145 mg/dL — ABNORMAL HIGH (ref 65–99)
Potassium: 5 mmol/L (ref 3.5–5.2)
Sodium: 145 mmol/L — ABNORMAL HIGH (ref 134–144)
Total Protein: 7 g/dL (ref 6.0–8.5)
eGFR: 99 mL/min/{1.73_m2} (ref >59)

## 2021-07-04 LAB — CBC WITH DIFFERENTIAL/PLATELET
Basophils Absolute: 0.1 10*3/uL (ref 0.0–0.2)
Basos: 1 %
EOS (ABSOLUTE): 0.2 10*3/uL (ref 0.0–0.4)
Eos: 2 %
Hematocrit: 40.5 % (ref 34.0–46.6)
Hemoglobin: 13.1 g/dL (ref 11.1–15.9)
Immature Grans (Abs): 0.1 10*3/uL (ref 0.0–0.1)
Immature Granulocytes: 1 %
Lymphocytes Absolute: 3.7 10*3/uL — ABNORMAL HIGH (ref 0.7–3.1)
Lymphs: 39 %
MCH: 26.7 pg (ref 26.6–33.0)
MCHC: 32.3 g/dL (ref 31.5–35.7)
MCV: 83 fL (ref 79–97)
Monocytes Absolute: 0.6 10*3/uL (ref 0.1–0.9)
Monocytes: 6 %
Neutrophils Absolute: 4.9 10*3/uL (ref 1.4–7.0)
Neutrophils: 51 %
Platelets: 367 10*3/uL (ref 150–450)
RBC: 4.9 x10E6/uL (ref 3.77–5.28)
RDW: 15.5 % — ABNORMAL HIGH (ref 11.7–15.4)
WBC: 9.5 10*3/uL (ref 3.4–10.8)

## 2021-07-04 LAB — LIPID PANEL
Chol/HDL Ratio: 3.3 ratio (ref 0.0–4.4)
Cholesterol, Total: 175 mg/dL (ref 100–199)
HDL: 53 mg/dL (ref >39)
LDL Chol Calc (NIH): 95 mg/dL (ref 0–99)
Triglycerides: 156 mg/dL — ABNORMAL HIGH (ref 0–149)
VLDL Cholesterol Cal: 27 mg/dL (ref 5–40)

## 2021-07-04 LAB — HEMOGLOBIN A1C
Est. average glucose Bld gHb Est-mCnc: 148 mg/dL
Hgb A1c MFr Bld: 6.8 % — ABNORMAL HIGH (ref 4.8–5.6)

## 2021-07-04 LAB — CARDIOVASCULAR RISK ASSESSMENT

## 2021-07-07 ENCOUNTER — Encounter: Payer: Self-pay | Admitting: Family Medicine

## 2021-07-07 ENCOUNTER — Other Ambulatory Visit: Payer: Self-pay

## 2021-07-07 ENCOUNTER — Ambulatory Visit (INDEPENDENT_AMBULATORY_CARE_PROVIDER_SITE_OTHER): Payer: Managed Care, Other (non HMO) | Admitting: Family Medicine

## 2021-07-07 VITALS — BP 124/70 | HR 80 | Temp 97.4°F | Resp 16 | Ht 64.0 in | Wt 136.6 lb

## 2021-07-07 DIAGNOSIS — K219 Gastro-esophageal reflux disease without esophagitis: Secondary | ICD-10-CM

## 2021-07-07 DIAGNOSIS — I10 Essential (primary) hypertension: Secondary | ICD-10-CM | POA: Diagnosis not present

## 2021-07-07 DIAGNOSIS — E1121 Type 2 diabetes mellitus with diabetic nephropathy: Secondary | ICD-10-CM

## 2021-07-07 DIAGNOSIS — E782 Mixed hyperlipidemia: Secondary | ICD-10-CM

## 2021-07-07 DIAGNOSIS — F332 Major depressive disorder, recurrent severe without psychotic features: Secondary | ICD-10-CM

## 2021-07-07 NOTE — Progress Notes (Signed)
Subjective:  Patient ID: Rachel Hensley, female    DOB: 1956/05/17  Age: 65 y.o. MRN: [id number]  Chief Complaint  Patient presents with   Diabetes   Hyperlipidemia   Gastroesophageal Reflux   Hypertension    HPI Hypertension: on ziac 5-6.25 mg once daily.  Hyperlipidemia: on lipitor 20 mg once daily.  DM: On synjardy xr one twice a day and on trulicity 4.5 mg weekly. Not checking sugars.  GERD: On omeprazole 20 mg one twice a day.  Depression: Since her mother passed in 02/2021. Has had increased stress due to issues over the estate with her brother. She does not want medication at this time.   PHQ9 SCORE ONLY 07/07/2021 08/21/2020 05/20/2020  PHQ-9 Total Score 22 8 0   Current Outpatient Medications on File Prior to Visit  Medication Sig Dispense Refill   aspirin 81 MG chewable tablet Chew 81 mg by mouth daily.     atorvastatin (LIPITOR) 20 MG tablet TAKE 1 TABLET DAILY 90 tablet 3   bisoprolol-hydrochlorothiazide (ZIAC) 5-6.25 MG tablet TAKE 1 TABLET DAILY 90 tablet 3   cetirizine (ZYRTEC) 10 MG tablet Take 10 mg by mouth daily.     Dermatological Products, Misc. [facility]) lotion APPLY TO THE AFFECTED AREA(S) ON ON THE LEG TWICE DAILY     EPINEPHrine 0.3 mg/0.3 mL IJ SOAJ injection Inject 0.3 mg into the muscle once.     omeprazole (PRILOSEC) 20 MG capsule Take 1 capsule (20 mg total) by mouth 2 (two) times daily. 180 capsule 0   ondansetron (ZOFRAN ODT) 4 MG disintegrating tablet Take 1 tablet (4 mg total) by mouth every 8 (eight) hours as needed for nausea or vomiting. 30 tablet 0   promethazine (PHENERGAN) 25 MG tablet Take 1 tablet (25 mg total) by mouth every 8 (eight) hours as needed for nausea or vomiting. 20 tablet 0   SYNJARDY XR 12.04-999 MG TB24 TAKE 1 TABLET TWICE A DAY 180 tablet 1   triamcinolone cream (KENALOG) 0.1 % Apply 1 application topically 2 (two) times daily.     TRULICITY 4.5 MG/0.5ML SOPN INJECT 4.5 MG AS DIRECTED ONCE A WEEK 6 mL 3   No current  facility-administered medications on file prior to visit.   Past Medical History:  Diagnosis Date   Mixed hyperlipidemia    Nontoxic multinodular goiter    Other vitamin B12 deficiency anemias    Solitary pulmonary nodule    Type 2 diabetes mellitus with other specified complication [facility])    Past Surgical History:  Procedure Laterality Date   ABDOMINAL HYSTERECTOMY  06/1993   partial; still has both ovaries   HYSTEROTOMY     partial    Family History  Problem Relation Age of Onset   Eczema Grandchild    Atopy Grandchild    Asthma Son        childhood asthma   Coronary artery disease Mother    Glaucoma Mother    Colon cancer Father    Kidney cancer Brother    Diabetes type II Brother    Allergic rhinitis Neg Hx    Angioedema Neg Hx    Immunodeficiency Neg Hx    Urticaria Neg Hx    Social History   Socioeconomic History   Marital status: Married    Spouse name: Not on file   Number of children: 3   Years of education: Not on file   Highest education level: Not on file  Occupational History   Occupation: Geographical information systems officer  Tobacco Use   Smoking status: Never   Smokeless tobacco: Never  Vaping Use   Vaping Use: Never used  Substance and Sexual Activity   Alcohol use: Never   Drug use: Never   Sexual activity: Not on file  Other Topics Concern   Not on file  Social History Narrative   Not on file   Social Determinants of Health   Financial Resource Strain: Not on file  Food Insecurity: Not on file  Transportation Needs: Not on file  Physical Activity: Not on file  Stress: Not on file  Social Connections: Not on file    Review of Systems  Constitutional:  Negative for chills, fatigue and fever.  HENT:  Negative for congestion, rhinorrhea and sore throat.   Respiratory:  Negative for cough and shortness of breath.   Cardiovascular:  Negative for chest pain.  Gastrointestinal:  Negative for abdominal pain, constipation, diarrhea, nausea and vomiting.   Genitourinary:  Negative for dysuria and urgency.  Musculoskeletal:  Positive for arthralgias, back pain and myalgias.  Neurological:  Negative for dizziness, weakness, light-headedness and headaches.  Psychiatric/Behavioral:  Negative for dysphoric mood. The patient is not nervous/anxious.     Objective:  BP 124/70   Pulse 80   Temp (!) 97.4 F (36.3 C)   Resp 16   Ht 5\' 4"  (1.626 m)   Wt 136 lb 9.6 oz (62 kg)   BMI 23.45 kg/m   BP/Weight 07/07/2021 06/17/2021 03/06/2021  Systolic BP 124 120 128  Diastolic BP 70 68 68  Wt. (Lbs) 136.6 136.8 138  BMI 23.45 23.48 23.69    Physical Exam Vitals reviewed.  Constitutional:      Appearance: Normal appearance. She is normal weight.  Neck:     Vascular: No carotid bruit.  Cardiovascular:     Rate and Rhythm: Normal rate and regular rhythm.     Pulses: Normal pulses.     Heart sounds: Normal heart sounds.  Pulmonary:     Effort: Pulmonary effort is normal. No respiratory distress.     Breath sounds: Normal breath sounds.  Abdominal:     General: Abdomen is flat. Bowel sounds are normal.     Palpations: Abdomen is soft.     Tenderness: There is no abdominal tenderness.  Neurological:     Mental Status: She is alert and oriented to person, place, and time.  Psychiatric:        Behavior: Behavior normal.     Comments: tearful    Diabetic Foot Exam - Simple   Simple Foot Form  07/07/2021  9:04 PM  Visual Inspection No deformities, no ulcerations, no other skin breakdown bilaterally: Yes Sensation Testing Intact to touch and monofilament testing bilaterally: Yes Pulse Check Posterior Tibialis and Dorsalis pulse intact bilaterally: Yes Comments      Lab Results  Component Value Date   WBC 9.5 07/03/2021   HGB 13.1 07/03/2021   HCT 40.5 07/03/2021   PLT 367 07/03/2021   GLUCOSE 145 (H) 07/03/2021   CHOL 175 07/03/2021   TRIG 156 (H) 07/03/2021   HDL 53 07/03/2021   LDLCALC 95 07/03/2021   ALT 16 07/03/2021   AST  10 07/03/2021   NA 145 (H) 07/03/2021   K 5.0 07/03/2021   CL 105 07/03/2021   CREATININE 0.61 07/03/2021   BUN 19 07/03/2021   CO2 26 07/03/2021   HGBA1C 6.8 (H) 07/03/2021   MICROALBUR 80 03/06/2021      Assessment & Plan:  1. Diabetic glomerulopathy (HCC) Control: good Recommend check feet daily. Recommend annual eye exams. Medicines: no changes Continue to work on eating a healthy diet and exercise.  2. Essential hypertension, benign Well controlled.  No changes to medicines.  Continue to work on eating a healthy diet and exercise.   3. Mixed hyperlipidemia Well controlled.  No changes to medicines.  Continue to work on eating a healthy diet and exercise.   4. Gastroesophageal reflux disease without esophagitis The current medical regimen is effective;  continue present plan and medications.  5. Severe episode of recurrent major depressive disorder, without psychotic features (HCC)  No medicines at this time.  Recommend counseling.   Follow-up: Return in about 3 months (around 10/07/2021) for fasting.  An After Visit Summary was printed and given to the patient.  Blane Ohara, MD [facility] [phone]

## 2021-07-09 ENCOUNTER — Encounter: Payer: Self-pay | Admitting: Family Medicine

## 2021-08-01 ENCOUNTER — Other Ambulatory Visit: Payer: Self-pay | Admitting: Family Medicine

## 2021-08-25 ENCOUNTER — Other Ambulatory Visit: Payer: Self-pay | Admitting: Family Medicine

## 2021-08-25 NOTE — Telephone Encounter (Signed)
Refill sent to pharmacy.   

## 2021-08-26 ENCOUNTER — Other Ambulatory Visit: Payer: Self-pay | Admitting: Family Medicine

## 2021-10-15 ENCOUNTER — Encounter: Payer: Self-pay | Admitting: Family Medicine

## 2021-10-15 ENCOUNTER — Ambulatory Visit (INDEPENDENT_AMBULATORY_CARE_PROVIDER_SITE_OTHER): Payer: Managed Care, Other (non HMO) | Admitting: Family Medicine

## 2021-10-15 ENCOUNTER — Other Ambulatory Visit: Payer: Self-pay

## 2021-10-15 VITALS — BP 136/72 | HR 84 | Temp 97.4°F | Resp 14 | Ht 64.0 in | Wt 139.0 lb

## 2021-10-15 DIAGNOSIS — E1121 Type 2 diabetes mellitus with diabetic nephropathy: Secondary | ICD-10-CM | POA: Diagnosis not present

## 2021-10-15 DIAGNOSIS — E1159 Type 2 diabetes mellitus with other circulatory complications: Secondary | ICD-10-CM | POA: Diagnosis not present

## 2021-10-15 DIAGNOSIS — Z1231 Encounter for screening mammogram for malignant neoplasm of breast: Secondary | ICD-10-CM

## 2021-10-15 DIAGNOSIS — Z23 Encounter for immunization: Secondary | ICD-10-CM

## 2021-10-15 DIAGNOSIS — K219 Gastro-esophageal reflux disease without esophagitis: Secondary | ICD-10-CM | POA: Diagnosis not present

## 2021-10-15 DIAGNOSIS — I152 Hypertension secondary to endocrine disorders: Secondary | ICD-10-CM

## 2021-10-15 DIAGNOSIS — R2241 Localized swelling, mass and lump, right lower limb: Secondary | ICD-10-CM

## 2021-10-15 DIAGNOSIS — F5101 Primary insomnia: Secondary | ICD-10-CM

## 2021-10-15 DIAGNOSIS — E782 Mixed hyperlipidemia: Secondary | ICD-10-CM

## 2021-10-15 DIAGNOSIS — M7541 Impingement syndrome of right shoulder: Secondary | ICD-10-CM

## 2021-10-15 DIAGNOSIS — I10 Essential (primary) hypertension: Secondary | ICD-10-CM

## 2021-10-15 NOTE — Progress Notes (Signed)
Subjective:  Patient ID: Rachel Hensley, female    DOB: September 11, 1956  Age: 65 y.o. MRN: [id number]  Chief Complaint  Patient presents with  . Hypertension  . Gastroesophageal Reflux  . Diabetes    HPI Diabetes:  Complications: glomerulopathy.  Glucose checking: not checking Glucose logs: none Hypoglycemia: Occasionally. May 1-2 times per month.  Most recent A1C: 6.8  Current medications: Trulicity 4.5 mg oce a week, Synjardy xr 12.04/999 mg one twice a day.  Last Eye Exam: Due. Sees [facility].  Foot checks: daily.  Hyperlipidemia: Current medications: lipitor 20 mg once daily and aspirin daily.   Hypertension: Complications: Current medications: bisoprolol-hctz 5/6.25 mg once daily.  GERD: omeprazole 20 mg once daily.   Diet: usually healthy. Atkins shakes. Exercise: no.   Employment: [employer] - 9 hours a day.   Continued stress concerning tension between her siblings and she about her mother's estate. Mother passed in 02/2021. She is now estranged from some of her siblings. She has a supportive immediate family (her children.) She is a widower for 6 yrs.   Current Outpatient Medications on File Prior to Visit  Medication Sig Dispense Refill  . aspirin 81 MG chewable tablet Chew 81 mg by mouth daily.    Marland Kitchen atorvastatin (LIPITOR) 20 MG tablet TAKE 1 TABLET DAILY 90 tablet 3  . bisoprolol-hydrochlorothiazide (ZIAC) 5-6.25 MG tablet TAKE 1 TABLET DAILY 90 tablet 3  . cetirizine (ZYRTEC) 10 MG tablet Take 10 mg by mouth daily.    . Dermatological Products, Misc. [facility]) lotion APPLY TO THE AFFECTED AREA(S) ON ON THE LEG TWICE DAILY    . EPINEPHrine 0.3 mg/0.3 mL IJ SOAJ injection Inject 0.3 mg into the muscle once.    Marland Kitchen omeprazole (PRILOSEC) 20 MG capsule TAKE 1 CAPSULE TWICE A DAY 180 capsule 1  . ondansetron (ZOFRAN ODT) 4 MG disintegrating tablet Take 1 tablet (4 mg total) by mouth every 8 (eight) hours as needed for nausea or vomiting. 30 tablet 0  .  promethazine (PHENERGAN) 25 MG tablet Take 1 tablet (25 mg total) by mouth every 8 (eight) hours as needed for nausea or vomiting. 20 tablet 0  . SYNJARDY XR 12.04-999 MG TB24 TAKE 1 TABLET TWICE A DAY 180 tablet 3  . triamcinolone cream (KENALOG) 0.1 % Apply 1 application topically 2 (two) times daily.    . TRULICITY 4.5 MG/0.5ML SOPN INJECT 4.5 MG AS DIRECTED ONCE A WEEK 6 mL 3   No current facility-administered medications on file prior to visit.   Past Medical History:  Diagnosis Date  . Hypoglycemia due to type 2 diabetes mellitus (HCC) 08/21/2020  . Mixed hyperlipidemia   . Nontoxic multinodular goiter   . Other vitamin B12 deficiency anemias   . Solitary pulmonary nodule   . Type 2 diabetes mellitus with other specified complication [facility])    Past Surgical History:  Procedure Laterality Date  . ABDOMINAL HYSTERECTOMY  06/1993   partial; still has both ovaries  . HYSTEROTOMY     partial    Family History  Problem Relation Age of Onset  . Eczema Grandchild   . Atopy Grandchild   . Asthma Son        childhood asthma  . Coronary artery disease Mother   . Glaucoma Mother   . Colon cancer Father   . Kidney cancer Brother   . Diabetes type II Brother   . Allergic rhinitis Neg Hx   . Angioedema Neg Hx   . Immunodeficiency Neg  Hx   . Urticaria Neg Hx    Social History   Socioeconomic History  . Marital status: Widowed    Spouse name: Not on file  . Number of children: 3  . Years of education: Not on file  . Highest education level: Not on file  Occupational History  . Occupation: Geographical information systems officer  Tobacco Use  . Smoking status: Never  . Smokeless tobacco: Never  Vaping Use  . Vaping Use: Never used  Substance and Sexual Activity  . Alcohol use: Never  . Drug use: Never  . Sexual activity: Not on file  Other Topics Concern  . Not on file  Social History Narrative  . Not on file   Social Determinants of Health   Financial Resource Strain: Not on file  Food Insecurity:  Not on file  Transportation Needs: Not on file  Physical Activity: Not on file  Stress: Not on file  Social Connections: Not on file    Review of Systems  Constitutional:  Negative for chills, fatigue and fever.  HENT:  Negative for congestion, rhinorrhea and sore throat.   Respiratory:  Negative for cough and shortness of breath.   Cardiovascular:  Negative for chest pain.  Gastrointestinal:  Positive for nausea. Negative for abdominal pain, constipation, diarrhea and vomiting.  Genitourinary:  Negative for dysuria and urgency.  Musculoskeletal:  Positive for arthralgias (right shoulder pain x 1 month.). Negative for back pain and myalgias.  Neurological:  Positive for headaches. Negative for dizziness, weakness and light-headedness.  Psychiatric/Behavioral:  Positive for dysphoric mood. The patient is not nervous/anxious.     Objective:  BP 136/72   Pulse 84   Temp (!) 97.4 F (36.3 C)   Resp 14   Ht 5\' 4"  (1.626 m)   Wt 139 lb (63 kg)   BMI 23.86 kg/m   BP/Weight 10/15/2021 07/07/2021 06/17/2021  Systolic BP 136 124 120  Diastolic BP 72 70 68  Wt. (Lbs) 139 136.6 136.8  BMI 23.86 23.45 23.48    Physical Exam Vitals reviewed.  Constitutional:      Appearance: Normal appearance. She is normal weight.  Neck:     Vascular: No carotid bruit.  Cardiovascular:     Rate and Rhythm: Normal rate and regular rhythm.     Pulses: Normal pulses.     Heart sounds: Normal heart sounds.  Pulmonary:     Effort: Pulmonary effort is normal. No respiratory distress.     Breath sounds: Normal breath sounds.  Abdominal:     General: Abdomen is flat. Bowel sounds are normal.     Palpations: Abdomen is soft.     Tenderness: There is no abdominal tenderness.  Musculoskeletal:        General: Tenderness (rt superiof shoulder. limited abduction of rt arm and internal rtotation. postive empty can sign. external rotation is good. Left arm FROM without essue.) present.  Skin:    Findings:  Lesion (anterior right shin. 1 cm mildly tender firm, mobile nodule. No discharge, No increased calor.) present.  Neurological:     Mental Status: She is alert and oriented to person, place, and time.  Psychiatric:        Mood and Affect: Mood normal.        Behavior: Behavior normal.    Diabetic Foot Exam - Simple   Simple Foot Form Diabetic Foot exam was performed with the following findings: Yes 10/15/2021  8:09 AM  Visual Inspection No deformities, no ulcerations, no other skin breakdown  bilaterally: Yes Sensation Testing Intact to touch and monofilament testing bilaterally: Yes Pulse Check Posterior Tibialis and Dorsalis pulse intact bilaterally: Yes Comments      Lab Results  Component Value Date   WBC 9.5 07/03/2021   HGB 13.1 07/03/2021   HCT 40.5 07/03/2021   PLT 367 07/03/2021   GLUCOSE 145 (H) 07/03/2021   CHOL 175 07/03/2021   TRIG 156 (H) 07/03/2021   HDL 53 07/03/2021   LDLCALC 95 07/03/2021   ALT 16 07/03/2021   AST 10 07/03/2021   NA 145 (H) 07/03/2021   K 5.0 07/03/2021   CL 105 07/03/2021   CREATININE 0.61 07/03/2021   BUN 19 07/03/2021   CO2 26 07/03/2021   HGBA1C 6.8 (H) 07/03/2021   MICROALBUR 80 03/06/2021      Assessment & Plan:   Problem List Items Addressed This Visit       Cardiovascular and Mediastinum   Hypertension associated with diabetes (HCC) - Primary (Chronic)    The current medical regimen is effective;  continue present plan and medications. Continue current medications: bisoprolol-hctz 5/6.25 mg once daily.       Relevant Orders   CBC with Differential/Platelet   Comprehensive metabolic panel   TSH     Digestive   GERD without esophagitis    The current medical regimen is effective;  continue present plan and medications. Continue omeprazole 20 mg once daily.         Endocrine   Diabetic glomerulopathy (HCC)    Well controlled.  Does not need to check sugars.  Continue Trulicity 4.5 mg oce a week, Synjardy  xr 12.04/999 mg one twice a day.  Last Eye Exam: Due. Sees [facility].  Foot checks: daily.       Relevant Orders   Ambulatory referral to Ophthalmology   Hemoglobin A1c   POCT UA - Microalbumin     Musculoskeletal and Integument   Impingement syndrome of right shoulder    Recommend exercises. Hand out given.  OTC nsaids.          Other   Mixed hyperlipidemia    The current medical regimen is effective;  continue present plan and medications. Continue lipitor 20 mg once daily and aspirin daily.  Recommend continue to work on eating healthy diet and exercise.        Relevant Orders   Lipid panel   Nodule of skin of right lower leg    Reassurance given. Mobile. Mildly tender.  Continue to monitor.       Primary insomnia   Other Visit Diagnoses     Visit for screening mammogram       Relevant Orders   MM DIGITAL SCREENING BILATERAL   Need for vaccination for strep pneumoniae + influenza       Relevant Orders   Pneumococcal conjugate vaccine 20-valent (Prevnar 20) (Completed)   Flu Vaccine QUAD High Dose(Fluad) (Completed)     .  No orders of the defined types were placed in this encounter.   Orders Placed This Encounter  Procedures  . MM DIGITAL SCREENING BILATERAL  . Pneumococcal conjugate vaccine 20-valent (Prevnar 20)  . Flu Vaccine QUAD High Dose(Fluad)  . CBC with Differential/Platelet  . Comprehensive metabolic panel  . Hemoglobin A1c  . Lipid panel  . TSH  . Ambulatory referral to Ophthalmology  . POCT UA - Microalbumin    I spent 40 minutes reviewing chart and speaking face to face with patient.   Follow-up: Return in  about 3 months (around 01/15/2022) for chronic fasting.  An After Visit Summary was printed and given to the patient.  Blane Ohara, MD [facility] [phone]

## 2021-10-19 ENCOUNTER — Encounter: Payer: Self-pay | Admitting: Family Medicine

## 2021-10-19 DIAGNOSIS — R2241 Localized swelling, mass and lump, right lower limb: Secondary | ICD-10-CM | POA: Insufficient documentation

## 2021-10-19 DIAGNOSIS — M7541 Impingement syndrome of right shoulder: Secondary | ICD-10-CM

## 2021-10-19 HISTORY — DX: Localized swelling, mass and lump, right lower limb: R22.41

## 2021-10-19 HISTORY — DX: Impingement syndrome of right shoulder: M75.41

## 2021-10-19 NOTE — Assessment & Plan Note (Signed)
The current medical regimen is effective;  continue present plan and medications. Continue omeprazole 20 mg once daily.  

## 2021-10-19 NOTE — Assessment & Plan Note (Signed)
The current medical regimen is effective;  continue present plan and medications. Continue current medications: bisoprolol-hctz 5/6.25 mg once daily.

## 2021-10-19 NOTE — Assessment & Plan Note (Addendum)
Recommend exercises. Hand out given.  OTC nsaids.

## 2021-10-19 NOTE — Assessment & Plan Note (Addendum)
The current medical regimen is effective;  continue present plan and medications. Continue lipitor 20 mg once daily and aspirin daily.  Recommend continue to work on eating healthy diet and exercise.

## 2021-10-19 NOTE — Assessment & Plan Note (Signed)
Reassurance given. Mobile. Mildly tender.  Continue to monitor.

## 2021-10-19 NOTE — Assessment & Plan Note (Signed)
Well controlled.  Does not need to check sugars.  Continue Trulicity 4.5 mg oce a week, Synjardy xr 12.04/999 mg one twice a day.  Last Eye Exam: Due. Sees [facility].  Foot checks: daily.

## 2021-10-21 LAB — CBC WITH DIFFERENTIAL/PLATELET
Basophils Absolute: 0 10*3/uL (ref 0.0–0.2)
Basos: 1 %
EOS (ABSOLUTE): 0.2 10*3/uL (ref 0.0–0.4)
Eos: 2 %
Hematocrit: 40.7 % (ref 34.0–46.6)
Hemoglobin: 13.1 g/dL (ref 11.1–15.9)
Immature Grans (Abs): 0 10*3/uL (ref 0.0–0.1)
Immature Granulocytes: 0 %
Lymphocytes Absolute: 3.4 10*3/uL — ABNORMAL HIGH (ref 0.7–3.1)
Lymphs: 39 %
MCH: 26.8 pg (ref 26.6–33.0)
MCHC: 32.2 g/dL (ref 31.5–35.7)
MCV: 83 fL (ref 79–97)
Monocytes Absolute: 0.7 10*3/uL (ref 0.1–0.9)
Monocytes: 8 %
Neutrophils Absolute: 4.4 10*3/uL (ref 1.4–7.0)
Neutrophils: 50 %
Platelets: 339 10*3/uL (ref 150–450)
RBC: 4.89 x10E6/uL (ref 3.77–5.28)
RDW: 15.2 % (ref 11.7–15.4)
WBC: 8.7 10*3/uL (ref 3.4–10.8)

## 2021-10-21 LAB — COMPREHENSIVE METABOLIC PANEL
ALT: 16 IU/L (ref 0–32)
AST: 14 IU/L (ref 0–40)
Albumin/Globulin Ratio: 1.7 (ref 1.2–2.2)
Albumin: 4.3 g/dL (ref 3.8–4.8)
Alkaline Phosphatase: 79 IU/L (ref 44–121)
BUN/Creatinine Ratio: 25 (ref 12–28)
BUN: 16 mg/dL (ref 8–27)
Bilirubin Total: 0.3 mg/dL (ref 0.0–1.2)
CO2: 23 mmol/L (ref 20–29)
Calcium: 9.6 mg/dL (ref 8.7–10.3)
Chloride: 100 mmol/L (ref 96–106)
Creatinine, Ser: 0.64 mg/dL (ref 0.57–1.00)
Globulin, Total: 2.5 g/dL (ref 1.5–4.5)
Glucose: 119 mg/dL — ABNORMAL HIGH (ref 70–99)
Potassium: 4.5 mmol/L (ref 3.5–5.2)
Sodium: 140 mmol/L (ref 134–144)
Total Protein: 6.8 g/dL (ref 6.0–8.5)
eGFR: 97 mL/min/{1.73_m2} (ref >59)

## 2021-10-21 LAB — LIPID PANEL
Chol/HDL Ratio: 3.1 ratio (ref 0.0–4.4)
Cholesterol, Total: 164 mg/dL (ref 100–199)
HDL: 53 mg/dL (ref >39)
LDL Chol Calc (NIH): 84 mg/dL (ref 0–99)
Triglycerides: 156 mg/dL — ABNORMAL HIGH (ref 0–149)
VLDL Cholesterol Cal: 27 mg/dL (ref 5–40)

## 2021-10-21 LAB — TSH: TSH: 0.661 u[IU]/mL (ref 0.450–4.500)

## 2021-10-21 LAB — HEMOGLOBIN A1C
Est. average glucose Bld gHb Est-mCnc: 160 mg/dL
Hgb A1c MFr Bld: 7.2 % — ABNORMAL HIGH (ref 4.8–5.6)

## 2021-10-21 NOTE — Progress Notes (Signed)
Blood count normal.  Liver function normal.  Kidney function normal.  Cholesterol: stable. HBA1C: 7.2. little worse.  Recommend low sugar diet.  Continue current diabetes medicines.

## 2021-10-27 ENCOUNTER — Encounter: Payer: Self-pay | Admitting: Family Medicine

## 2021-10-27 LAB — POCT UA - MICROALBUMIN: Microalbumin Ur, POC: 150 mg/L

## 2021-10-28 ENCOUNTER — Other Ambulatory Visit: Payer: Self-pay | Admitting: Family Medicine

## 2021-10-28 DIAGNOSIS — Z1231 Encounter for screening mammogram for malignant neoplasm of breast: Secondary | ICD-10-CM

## 2021-12-23 ENCOUNTER — Ambulatory Visit
Admission: RE | Admit: 2021-12-23 | Discharge: 2021-12-23 | Disposition: A | Discharge status: Home / Self Care | Payer: Medicare Other | Source: Ambulatory Visit | Attending: Family Medicine | Admitting: Family Medicine

## 2021-12-23 ENCOUNTER — Other Ambulatory Visit: Payer: Self-pay

## 2022-01-20 NOTE — Progress Notes (Signed)
Subjective:  Patient ID: Rachel Hensley, female    DOB: August 09, 1956  Age: 66 y.o. MRN: [id number]  Chief Complaint  Patient presents with   Diabetes   Gastroesophageal Reflux   Hyperlipidemia    HPI Diabetes complicated by nephropathy: Checking sugar once daily. 80-140. Eating somewhat healthy.Active at work. Patient is taking synjardy XR 12.04-999 mg daily, Trulicity 4.5 mg weekly. Last A1C 7.2%. checks feet daily. Due for eye exam. I gave kerendia samples and Patient says she called when she ran out, but no one called her to let her know if she needed to continue.   Hyperlipidemia: She takes atorvastatin 20 mg daily.  Hypertension: Currently taking Bisoprolol-HTCZ 5-6.25 mg daily and aspirin 81 mg daily.  GERD: Omeprazole 20 mg twice a daily. Does not tolerate once daily.   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 Exercises given last visit.   Current Outpatient Medications on File Prior to Visit  Medication Sig Dispense Refill   aspirin 81 MG chewable tablet Chew 81 mg by mouth daily.     atorvastatin (LIPITOR) 20 MG tablet TAKE 1 TABLET DAILY 90 tablet 3   bisoprolol-hydrochlorothiazide (ZIAC) 5-6.25 MG tablet TAKE 1 TABLET DAILY 90 tablet 3   cetirizine (ZYRTEC) 10 MG tablet Take 10 mg by mouth daily.     EPINEPHrine 0.3 mg/0.3 mL IJ SOAJ injection Inject 0.3 mg into the muscle once.     omeprazole (PRILOSEC) 20 MG capsule TAKE 1 CAPSULE TWICE A DAY 180 capsule 1   ondansetron (ZOFRAN ODT) 4 MG disintegrating tablet Take 1 tablet (4 mg total) by mouth every 8 (eight) hours as needed for nausea or vomiting. 30 tablet 0   promethazine (PHENERGAN) 25 MG tablet Take 1 tablet (25 mg total) by mouth every 8 (eight) hours as needed for nausea or vomiting. 20 tablet 0   SYNJARDY XR 12.04-999 MG TB24 TAKE 1 TABLET TWICE A DAY 180 tablet 3   TRULICITY 4.5 MG/0.5ML SOPN INJECT 4.5 MG AS DIRECTED ONCE A WEEK 6 mL 3   No current  facility-administered medications on file prior to visit.   Past Medical History:  Diagnosis Date   Hypoglycemia due to type 2 diabetes mellitus (HCC) 08/21/2020   Mixed hyperlipidemia    Nontoxic multinodular goiter    Other vitamin B12 deficiency anemias    Primary insomnia 05/20/2020   Solitary pulmonary nodule    Type 2 diabetes mellitus with other specified complication [facility])    Past Surgical History:  Procedure Laterality Date   ABDOMINAL HYSTERECTOMY  06/1993   partial; still has both ovaries   HYSTEROTOMY     partial    Family History  Problem Relation Age of Onset   Coronary artery disease Mother    Glaucoma Mother    Colon cancer Father    Diabetes Sister    Atrial fibrillation Sister    Cancer Brother        renal   Kidney cancer Brother    Diabetes type II Brother    Coronary artery disease Brother    Diabetes Brother    Diabetes Brother    Coronary artery disease Brother    Asthma Son        childhood asthma   Arrhythmia Son    Atrial fibrillation Son    Parkinsonism Son    Eczema Grandchild    Atopy Grandchild    Allergic rhinitis Neg Hx    Angioedema Neg Hx  Immunodeficiency Neg Hx    Urticaria Neg Hx    Breast cancer Neg Hx    Social History   Socioeconomic History   Marital status: Widowed    Spouse name: Not on file   Number of children: 3   Years of education: Not on file   Highest education level: Not on file  Occupational History   Occupation: Sewer  Tobacco Use   Smoking status: Never   Smokeless tobacco: Never  Vaping Use   Vaping Use: Never used  Substance and Sexual Activity   Alcohol use: Never   Drug use: Never   Sexual activity: Not on file  Other Topics Concern   Not on file  Social History Narrative   Not on file   Social Determinants of Health   Financial Resource Strain: Not on file  Food Insecurity: Not on file  Transportation Needs: Not on file  Physical Activity: Not on file  Stress: Not on file  Social  Connections: Not on file    Review of Systems  Constitutional:  Negative for chills, fatigue and fever.  HENT:  Negative for congestion, rhinorrhea and sore throat.   Respiratory:  Negative for cough and shortness of breath.   Cardiovascular:  Negative for chest pain.  Gastrointestinal:  Negative for abdominal pain, constipation, diarrhea, nausea and vomiting.  Genitourinary:  Negative for dysuria and urgency.  Musculoskeletal:  Positive for arthralgias (right shoulder pain). Negative for back pain and myalgias.  Neurological:  Positive for light-headedness and headaches. Negative for dizziness and weakness.  Psychiatric/Behavioral:  Negative for dysphoric mood. The patient is not nervous/anxious.     Objective:  BP 116/60    Pulse 88    Temp 99 F (37.2 C)    Resp 16    Ht 5\' 4"  (1.626 m)    Wt 143 lb (64.9 kg)    BMI 24.55 kg/m   BP/Weight 01/21/2022 10/15/2021 07/07/2021  Systolic BP 116 136 124  Diastolic BP 60 72 70  Wt. (Lbs) 143 139 136.6  BMI 24.55 23.86 23.45    Physical Exam Vitals reviewed.  Constitutional:      Appearance: Normal appearance. She is normal weight.  Neck:     Vascular: No carotid bruit.  Cardiovascular:     Rate and Rhythm: Normal rate and regular rhythm.     Pulses: Normal pulses.     Heart sounds: Normal heart sounds.  Pulmonary:     Effort: Pulmonary effort is normal. No respiratory distress.     Breath sounds: Normal breath sounds.  Abdominal:     General: Abdomen is flat. Bowel sounds are normal.     Palpations: Abdomen is soft.     Tenderness: There is no abdominal tenderness.  Musculoskeletal:        General: Tenderness present.     Comments: RIGHT SHOULDER EXAM TENDER: anterior and posterior FROM ABNORMAL ABDUCTION: FULL, BUT SLOWED AND PAINFUL EXTERNAL ROTATION: LIMITED INTERNAL ROTATION: NOT LIMITED.  ALL ELICIT PAIN   Skin:    Findings: Lesion (PIGMENTED 1 CM LESION ON LATERAL RIGHT LOWER LEG. NONTENDER. C/W SCAR TISSUE.)  present.  Neurological:     Mental Status: She is alert and oriented to person, place, and time.  Psychiatric:        Mood and Affect: Mood normal.        Behavior: Behavior normal.    Diabetic Foot Exam - Simple   Simple Foot Form Diabetic Foot exam was performed with the following findings:  Yes 01/21/2022  8:07 AM  Visual Inspection No deformities, no ulcerations, no other skin breakdown bilaterally: Yes Sensation Testing Intact to touch and monofilament testing bilaterally: Yes Pulse Check Posterior Tibialis and Dorsalis pulse intact bilaterally: Yes Comments      Lab Results  Component Value Date   WBC 8.7 10/15/2021   HGB 13.1 10/15/2021   HCT 40.7 10/15/2021   PLT 339 10/15/2021   GLUCOSE 119 (H) 10/15/2021   CHOL 164 10/15/2021   TRIG 156 (H) 10/15/2021   HDL 53 10/15/2021   LDLCALC 84 10/15/2021   ALT 16 10/15/2021   AST 14 10/15/2021   NA 140 10/15/2021   K 4.5 10/15/2021   CL 100 10/15/2021   CREATININE 0.64 10/15/2021   BUN 16 10/15/2021   CO2 23 10/15/2021   TSH 0.661 10/15/2021   HGBA1C 7.2 (H) 10/15/2021   MICROALBUR 150 10/27/2021      Assessment & Plan:   Problem List Items Addressed This Visit       Cardiovascular and Mediastinum   Hypertension associated with diabetes (HCC) - Primary (Chronic)    Well controlled.  No changes to medicines.  Continue to work on eating a healthy diet and exercise.  Labs drawn today.        Relevant Orders   Comprehensive metabolic panel   CBC with Differential/Platelet     Digestive   GERD without esophagitis    The current medical regimen is effective;  continue present plan and medications. Had to go back up to 20 mg twice daily           Endocrine   Diabetic glomerulopathy (HCC)    Control: good Recommend check sugars fasting daily. Recommend check feet daily. Recommend annual eye exams. Medicines: Continue synjardy XR 12.04-999 mg daily, Trulicity 4.5 mg weekly.  Continue to work on  eating a healthy diet and exercise.  Labs drawn today.         Relevant Orders   Hemoglobin A1c   Microalbumin / creatinine urine ratio     Musculoskeletal and Integument   Impingement syndrome of right shoulder      Recommend meloxicam 15 mg once daily x 2 weeks. Continue shoulder exercises.  If not improved significantly or if returns after stopping meloxicam, I would recommend get xray done at [facility].  XRAY order is attached.   May call and get appointment for shoulder injection if not improved/resolved.       Relevant Medications   meloxicam (MOBIC) 15 MG tablet   Other Relevant Orders   DG Shoulder Right     Other   Mixed hyperlipidemia    Well controlled.  No changes to medicines. Contiue atorvastatin 20 mg daily. Continue to work on eating a healthy diet and exercise.  Labs drawn today.        Relevant Orders   Lipid panel   RESOLVED: Primary insomnia   Nodule of skin of right lower leg    No only abnormal pigmentation. Recommend mederma      ACEI/ARB contraindicated    Angioedema experienced with ACE inhibitor.      .  Meds ordered this encounter  Medications   meloxicam (MOBIC) 15 MG tablet    Sig: Take 1 tablet (15 mg total) by mouth daily.    Dispense:  30 tablet    Refill:  0    Orders Placed This Encounter  Procedures   DG Shoulder Right   Comprehensive metabolic panel  Hemoglobin A1c   Lipid panel   Microalbumin / creatinine urine ratio   CBC with Differential/Platelet     Follow-up: Return in about 3 months (around 04/21/2022) for chronic fasting.  An After Visit Summary was printed and given to the patient.  Blane OharaKirsten Adore Kithcart, MD [facility] [phone]

## 2022-01-21 ENCOUNTER — Ambulatory Visit (INDEPENDENT_AMBULATORY_CARE_PROVIDER_SITE_OTHER): Payer: Managed Care, Other (non HMO) | Admitting: Family Medicine

## 2022-01-21 ENCOUNTER — Other Ambulatory Visit: Payer: Self-pay

## 2022-01-21 ENCOUNTER — Encounter: Payer: Self-pay | Admitting: Family Medicine

## 2022-01-21 VITALS — BP 116/60 | HR 88 | Temp 99.0°F | Resp 16 | Ht 64.0 in | Wt 143.0 lb

## 2022-01-21 DIAGNOSIS — F5101 Primary insomnia: Secondary | ICD-10-CM | POA: Diagnosis not present

## 2022-01-21 DIAGNOSIS — E1159 Type 2 diabetes mellitus with other circulatory complications: Secondary | ICD-10-CM | POA: Diagnosis not present

## 2022-01-21 DIAGNOSIS — K219 Gastro-esophageal reflux disease without esophagitis: Secondary | ICD-10-CM

## 2022-01-21 DIAGNOSIS — E782 Mixed hyperlipidemia: Secondary | ICD-10-CM

## 2022-01-21 DIAGNOSIS — E1121 Type 2 diabetes mellitus with diabetic nephropathy: Secondary | ICD-10-CM | POA: Diagnosis not present

## 2022-01-21 DIAGNOSIS — Z5309 Procedure and treatment not carried out because of other contraindication: Secondary | ICD-10-CM

## 2022-01-21 DIAGNOSIS — R2241 Localized swelling, mass and lump, right lower limb: Secondary | ICD-10-CM | POA: Diagnosis not present

## 2022-01-21 DIAGNOSIS — M7541 Impingement syndrome of right shoulder: Secondary | ICD-10-CM | POA: Diagnosis not present

## 2022-01-21 DIAGNOSIS — I152 Hypertension secondary to endocrine disorders: Secondary | ICD-10-CM

## 2022-01-21 HISTORY — DX: Procedure and treatment not carried out because of other contraindication: Z53.09

## 2022-01-21 MED ORDER — MELOXICAM 15 MG PO TABS: 15.0000 mg | ORAL_TABLET | Freq: Every day | ORAL | 0 refills | Status: DC

## 2022-01-21 NOTE — Assessment & Plan Note (Signed)
Control: good Recommend check sugars fasting daily. Recommend check feet daily. Recommend annual eye exams. Medicines: Continue synjardy XR 12.04-999 mg daily, Trulicity 4.5 mg weekly.  Continue to work on eating a healthy diet and exercise.  Labs drawn today.

## 2022-01-21 NOTE — Assessment & Plan Note (Signed)
No only abnormal pigmentation. Recommend mederma

## 2022-01-21 NOTE — Assessment & Plan Note (Signed)
Angioedema experienced with ACE inhibitor.

## 2022-01-21 NOTE — Assessment & Plan Note (Signed)
Well controlled.  ?No changes to medicines.  ?Continue to work on eating a healthy diet and exercise.  ?Labs drawn today.  ?

## 2022-01-21 NOTE — Patient Instructions (Addendum)
Recommend mederma for scar on right lower leg.   Recommend meloxicam 15 mg once daily x 2 weeks. Continue shoulder exercises.  If not improved significantly or if returns after stopping meloxicam, I would recommend get xray done at [facility].  XRAY order is attached.   May call and get appointment for shoulder injection if not improved/resolved.

## 2022-01-21 NOTE — Assessment & Plan Note (Signed)
Well controlled.  No changes to medicines. Contiue atorvastatin 20 mg daily. Continue to work on eating a healthy diet and exercise.  Labs drawn today.

## 2022-01-21 NOTE — Assessment & Plan Note (Signed)
°  Recommend meloxicam 15 mg once daily x 2 weeks. Continue shoulder exercises.  If not improved significantly or if returns after stopping meloxicam, I would recommend get xray done at [facility].  XRAY order is attached.   May call and get appointment for shoulder injection if not improved/resolved.

## 2022-01-21 NOTE — Assessment & Plan Note (Addendum)
The current medical regimen is effective;  continue present plan and medications. Had to go back up to 20 mg twice daily

## 2022-01-22 LAB — COMPREHENSIVE METABOLIC PANEL
ALT: 16 IU/L (ref 0–32)
AST: 14 IU/L (ref 0–40)
Albumin/Globulin Ratio: 1.6 (ref 1.2–2.2)
Albumin: 4.4 g/dL (ref 3.8–4.8)
Alkaline Phosphatase: 93 IU/L (ref 44–121)
BUN/Creatinine Ratio: 32 — ABNORMAL HIGH (ref 12–28)
BUN: 23 mg/dL (ref 8–27)
Bilirubin Total: 0.3 mg/dL (ref 0.0–1.2)
CO2: 25 mmol/L (ref 20–29)
Calcium: 9.9 mg/dL (ref 8.7–10.3)
Chloride: 101 mmol/L (ref 96–106)
Creatinine, Ser: 0.71 mg/dL (ref 0.57–1.00)
Globulin, Total: 2.7 g/dL (ref 1.5–4.5)
Glucose: 158 mg/dL — ABNORMAL HIGH (ref 70–99)
Potassium: 4.7 mmol/L (ref 3.5–5.2)
Sodium: 140 mmol/L (ref 134–144)
Total Protein: 7.1 g/dL (ref 6.0–8.5)
eGFR: 93 mL/min/{1.73_m2} (ref >59)

## 2022-01-22 LAB — CBC WITH DIFFERENTIAL/PLATELET
Basophils Absolute: 0 10*3/uL (ref 0.0–0.2)
Basos: 1 %
EOS (ABSOLUTE): 0.3 10*3/uL (ref 0.0–0.4)
Eos: 4 %
Hematocrit: 39 % (ref 34.0–46.6)
Hemoglobin: 12.8 g/dL (ref 11.1–15.9)
Immature Grans (Abs): 0 10*3/uL (ref 0.0–0.1)
Immature Granulocytes: 0 %
Lymphocytes Absolute: 2.8 10*3/uL (ref 0.7–3.1)
Lymphs: 34 %
MCH: 27.2 pg (ref 26.6–33.0)
MCHC: 32.8 g/dL (ref 31.5–35.7)
MCV: 83 fL (ref 79–97)
Monocytes Absolute: 0.6 10*3/uL (ref 0.1–0.9)
Monocytes: 8 %
Neutrophils Absolute: 4.5 10*3/uL (ref 1.4–7.0)
Neutrophils: 53 %
Platelets: 362 10*3/uL (ref 150–450)
RBC: 4.71 x10E6/uL (ref 3.77–5.28)
RDW: 15 % (ref 11.7–15.4)
WBC: 8.3 10*3/uL (ref 3.4–10.8)

## 2022-01-22 LAB — LIPID PANEL
Chol/HDL Ratio: 3.3 ratio (ref 0.0–4.4)
Cholesterol, Total: 170 mg/dL (ref 100–199)
HDL: 52 mg/dL (ref >39)
LDL Chol Calc (NIH): 87 mg/dL (ref 0–99)
Triglycerides: 184 mg/dL — ABNORMAL HIGH (ref 0–149)
VLDL Cholesterol Cal: 31 mg/dL (ref 5–40)

## 2022-01-22 LAB — MICROALBUMIN / CREATININE URINE RATIO
Creatinine, Urine: 80.8 mg/dL
Microalb/Creat Ratio: 59 mg/g creat — ABNORMAL HIGH (ref 0–29)
Microalbumin, Urine: 47.6 ug/mL

## 2022-01-22 LAB — HEMOGLOBIN A1C
Est. average glucose Bld gHb Est-mCnc: 140 mg/dL
Hgb A1c MFr Bld: 6.5 % — ABNORMAL HIGH (ref 4.8–5.6)

## 2022-01-22 LAB — CARDIOVASCULAR RISK ASSESSMENT

## 2022-01-23 ENCOUNTER — Other Ambulatory Visit: Payer: Self-pay

## 2022-01-23 MED ORDER — KERENDIA 10 MG PO TABS: 10.0000 mg | ORAL_TABLET | Freq: Every day | ORAL | 2 refills | Status: DC

## 2022-01-23 MED ORDER — ICOSAPENT ETHYL 1 G PO CAPS: 2.0000 g | ORAL_CAPSULE | Freq: Two times a day (BID) | ORAL | 2 refills | Status: DC

## 2022-01-23 NOTE — Progress Notes (Signed)
Blood count normal.  Liver function normal.  Kidney function normal.  Cholesterol: trigs up. Recommend start vascepa 1 gm 2 capsules twice a day HBA1C: improved Spilling protein in urine. Recommend start on kerendia 10 mg daily.

## 2022-01-27 ENCOUNTER — Telehealth: Payer: Self-pay

## 2022-01-27 NOTE — Telephone Encounter (Signed)
Prior auth for [place] for patient was ran threw insurance and has been approved for both.

## 2022-02-18 ENCOUNTER — Other Ambulatory Visit: Payer: Self-pay | Admitting: Family Medicine

## 2022-02-20 ENCOUNTER — Other Ambulatory Visit: Payer: Self-pay

## 2022-02-20 MED ORDER — TRULICITY 4.5 MG/0.5ML ~~LOC~~ SOAJ: 4.5000 mg | SUBCUTANEOUS | 0 refills | Status: DC

## 2022-02-26 ENCOUNTER — Other Ambulatory Visit: Payer: Self-pay | Admitting: Physician Assistant

## 2022-03-18 ENCOUNTER — Ambulatory Visit: Payer: Medicare Other

## 2022-03-23 ENCOUNTER — Other Ambulatory Visit: Payer: Self-pay

## 2022-03-23 ENCOUNTER — Telehealth: Payer: Self-pay

## 2022-03-23 MED ORDER — TRULICITY 4.5 MG/0.5ML ~~LOC~~ SOAJ: 4.5000 mg | SUBCUTANEOUS | 0 refills | Status: DC

## 2022-03-23 NOTE — Telephone Encounter (Signed)
[facility] at [facility]. [facility].  ? ?Patient was due for dose on Sunday. Due to not being able to take she has been taking glimepiride, she questions if this is ok until she is able to receive trulicity.  ? ?[name] 03/23/22 3:28 PM ? ?

## 2022-03-23 NOTE — Telephone Encounter (Signed)
Patient Rachel Hensley.  ? ?[name] 03/23/22 5:06 PM ? ?

## 2022-03-30 LAB — AMB REFERRAL TO OPHTHALMOLOGY

## 2022-04-15 ENCOUNTER — Encounter: Payer: Self-pay | Admitting: Family Medicine

## 2022-04-15 ENCOUNTER — Ambulatory Visit: Payer: Managed Care, Other (non HMO) | Admitting: Family Medicine

## 2022-04-15 VITALS — BP 110/60 | HR 84 | Temp 97.7°F | Resp 14 | Ht 62.5 in | Wt 148.2 lb

## 2022-04-15 DIAGNOSIS — E782 Mixed hyperlipidemia: Secondary | ICD-10-CM | POA: Diagnosis not present

## 2022-04-15 DIAGNOSIS — E1121 Type 2 diabetes mellitus with diabetic nephropathy: Secondary | ICD-10-CM | POA: Diagnosis not present

## 2022-04-15 DIAGNOSIS — I152 Hypertension secondary to endocrine disorders: Secondary | ICD-10-CM

## 2022-04-15 DIAGNOSIS — K219 Gastro-esophageal reflux disease without esophagitis: Secondary | ICD-10-CM

## 2022-04-15 DIAGNOSIS — Z5309 Procedure and treatment not carried out because of other contraindication: Secondary | ICD-10-CM

## 2022-04-15 DIAGNOSIS — E1159 Type 2 diabetes mellitus with other circulatory complications: Secondary | ICD-10-CM | POA: Diagnosis not present

## 2022-04-15 DIAGNOSIS — F5101 Primary insomnia: Secondary | ICD-10-CM

## 2022-04-15 NOTE — Progress Notes (Signed)
? ?Subjective:  ?Patient ID: Rachel Hensley, female    DOB: 17-Jul-1956  Age: 66 y.o. MRN: [id number] ? ?Chief Complaint  ?Patient presents with  ? Diabetes  ? Hypertension  ? ?HPI ?Diabetes:  ?Complications: nephropathy.  ?Glucose checking: not checking regularly.  ?Glucose logs: checked Sunday and it was 120. ?Hypoglycemia: no ?Most recent A1C: 6.5 ?Current medications: Ran out of trulicity and restarted trulicity 4.5 mg weekly, synjardy xr 12.04/999 mg one twice a day.Kerendia 10 mg once daily.  ?Last Eye Exam: 02/2022. ?Foot checks: daily. ? ?Hyperlipidemia: ?Current medications: Gradually increased vascepa 1 gm 2 capsules twice a day and atorvastatin 20 mg once daily. . ? ?Hypertension: ?Current medications: Bisoprolol-hydrochlorothiazide 5/6.25 mg once daily. Aspirin 81 mg daily. ? ?GERD: omeprazole 20 mg twice daily. Unable to decrease or gets breakthrough gerd.  ? ?Diet: healthy ?Exercise: not really.  ? ?Current Outpatient Medications on File Prior to Visit  ?Medication Sig Dispense Refill  ? aspirin 81 MG chewable tablet Chew 81 mg by mouth daily.    ? atorvastatin (LIPITOR) 20 MG tablet TAKE 1 TABLET DAILY 90 tablet 3  ? bisoprolol-hydrochlorothiazide (ZIAC) 5-6.25 MG tablet TAKE 1 TABLET DAILY 90 tablet 0  ? cetirizine (ZYRTEC) 10 MG tablet Take 10 mg by mouth daily.    ? Dulaglutide (TRULICITY) 4.5 MG/0.5ML SOPN Inject 4.5 mg as directed once a week. 3 mL 0  ? EPINEPHrine 0.3 mg/0.3 mL IJ SOAJ injection Inject 0.3 mg into the muscle once.    ? Finerenone (KERENDIA) 10 MG TABS Take 10 mg by mouth daily in the afternoon. 90 tablet 2  ? omeprazole (PRILOSEC) 20 MG capsule TAKE 1 CAPSULE TWICE A DAY 180 capsule 1  ? ondansetron (ZOFRAN ODT) 4 MG disintegrating tablet Take 1 tablet (4 mg total) by mouth every 8 (eight) hours as needed for nausea or vomiting. 30 tablet 0  ? promethazine (PHENERGAN) 25 MG tablet Take 1 tablet (25 mg total) by mouth every 8 (eight) hours as needed for nausea or vomiting. 20  tablet 0  ? SYNJARDY XR 12.04-999 MG TB24 TAKE 1 TABLET TWICE A DAY 180 tablet 3  ? icosapent Ethyl (VASCEPA) 1 g capsule Take 2 capsules (2 g total) by mouth 2 (two) times daily. (Patient not taking: Reported on 04/15/2022) 120 capsule 2  ? ?No current facility-administered medications on file prior to visit.  ? ?Past Medical History:  ?Diagnosis Date  ? Hypoglycemia due to type 2 diabetes mellitus (HCC) 08/21/2020  ? Mixed hyperlipidemia   ? Nontoxic multinodular goiter   ? Other vitamin B12 deficiency anemias   ? Primary insomnia 05/20/2020  ? Solitary pulmonary nodule   ? Type 2 diabetes mellitus with other specified complication (HCC)   ? ?Past Surgical History:  ?Procedure Laterality Date  ? ABDOMINAL HYSTERECTOMY  06/1993  ? partial; still has both ovaries  ? HYSTEROTOMY    ? partial  ?  ?Family History  ?Problem Relation Age of Onset  ? Coronary artery disease Mother   ? Glaucoma Mother   ? Colon cancer Father   ? Diabetes Sister   ? Atrial fibrillation Sister   ? Cancer Brother   ?     renal  ? Kidney cancer Brother   ? Diabetes type II Brother   ? Coronary artery disease Brother   ? Diabetes Brother   ? Diabetes Brother   ? Coronary artery disease Brother   ? Asthma Son   ?     childhood asthma  ?  Arrhythmia Son   ? Atrial fibrillation Son   ? Parkinsonism Son   ? Eczema Grandchild   ? Atopy Grandchild   ? Allergic rhinitis Neg Hx   ? Angioedema Neg Hx   ? Immunodeficiency Neg Hx   ? Urticaria Neg Hx   ? Breast cancer Neg Hx   ? ?Social History  ? ?Socioeconomic History  ? Marital status: Widowed  ?  Spouse name: Not on file  ? Number of children: 3  ? Years of education: Not on file  ? Highest education level: Not on file  ?Occupational History  ? Occupation: Geographical information systems officer  ?Tobacco Use  ? Smoking status: Never  ? Smokeless tobacco: Never  ?Vaping Use  ? Vaping Use: Never used  ?Substance and Sexual Activity  ? Alcohol use: Never  ? Drug use: Never  ? Sexual activity: Not on file  ?Other Topics Concern  ? Not on  file  ?Social History Narrative  ? Not on file  ? ?Social Determinants of Health  ? ?Financial Resource Strain: Not on file  ?Food Insecurity: Not on file  ?Transportation Needs: Not on file  ?Physical Activity: Not on file  ?Stress: Not on file  ?Social Connections: Not on file  ? ? ?Review of Systems  ?Constitutional:  Negative for chills, fatigue and fever.  ?HENT:  Positive for congestion. Negative for rhinorrhea and sore throat.   ?Eyes:  Positive for discharge (clear. itchy.).  ?Respiratory:  Negative for cough and shortness of breath.   ?Cardiovascular:  Negative for chest pain.  ?Gastrointestinal:  Negative for abdominal pain, constipation, diarrhea, nausea and vomiting.  ?Endocrine: Positive for polyuria. Negative for polydipsia and polyphagia.  ?Genitourinary:  Negative for dysuria and urgency.  ?Musculoskeletal:  Positive for arthralgias (right shoulder pain improving) and back pain. Negative for myalgias.  ?Neurological:  Negative for dizziness, weakness, light-headedness and headaches.  ?Psychiatric/Behavioral:  Positive for sleep disturbance (awakens numerous times at night.). Negative for dysphoric mood. The patient is not nervous/anxious.   ? ? ?Objective:  ?BP 110/60   Pulse 84   Temp 97.7 ?F (36.5 ?C)   Resp 14   Ht 5' 2.5" (1.588 m)   Wt 148 lb 3.2 oz (67.2 kg)   BMI 26.67 kg/m?  ? ? ?  04/15/2022  ?  7:46 AM 01/21/2022  ?  7:35 AM 10/15/2021  ?  7:30 AM  ?BP/Weight  ?Systolic BP 110 116 136  ?Diastolic BP 60 60 72  ?Wt. (Lbs) 148.2 143 139  ?BMI 26.67 kg/m2 24.55 kg/m2 23.86 kg/m2  ? ? ?Physical Exam ?Vitals reviewed.  ?Constitutional:   ?   Appearance: Normal appearance. She is normal weight.  ?Neck:  ?   Vascular: No carotid bruit.  ?Cardiovascular:  ?   Rate and Rhythm: Normal rate and regular rhythm.  ?   Heart sounds: Normal heart sounds.  ?Pulmonary:  ?   Effort: Pulmonary effort is normal. No respiratory distress.  ?   Breath sounds: Normal breath sounds.  ?Abdominal:  ?   General:  Abdomen is flat. Bowel sounds are normal.  ?   Palpations: Abdomen is soft.  ?   Tenderness: There is no abdominal tenderness.  ?Neurological:  ?   Mental Status: She is alert and oriented to person, place, and time.  ?Psychiatric:     ?   Mood and Affect: Mood normal.     ?   Behavior: Behavior normal.  ? ? ?Diabetic Foot Exam - Simple   ?  Simple Foot Form ?Diabetic Foot exam was performed with the following findings: Yes 04/15/2022  8:36 AM  ?Visual Inspection ?No deformities, no ulcerations, no other skin breakdown bilaterally: Yes ?Sensation Testing ?Intact to touch and monofilament testing bilaterally: Yes ?Pulse Check ?Posterior Tibialis and Dorsalis pulse intact bilaterally: Yes ?Comments ?  ?  ? ?Lab Results  ?Component Value Date  ? WBC 8.3 01/21/2022  ? HGB 12.8 01/21/2022  ? HCT 39.0 01/21/2022  ? PLT 362 01/21/2022  ? GLUCOSE 158 (H) 01/21/2022  ? CHOL 170 01/21/2022  ? TRIG 184 (H) 01/21/2022  ? HDL 52 01/21/2022  ? LDLCALC 87 01/21/2022  ? ALT 16 01/21/2022  ? AST 14 01/21/2022  ? NA 140 01/21/2022  ? K 4.7 01/21/2022  ? CL 101 01/21/2022  ? CREATININE 0.71 01/21/2022  ? BUN 23 01/21/2022  ? CO2 25 01/21/2022  ? TSH 0.661 10/15/2021  ? HGBA1C 6.5 (H) 01/21/2022  ? MICROALBUR 150 10/27/2021  ? ? ? ? ?Assessment & Plan:  ? ?Problem List Items Addressed This Visit   ? ?  ? Cardiovascular and Mediastinum  ? Hypertension associated with diabetes (HCC) (Chronic)  ?  Well controlled.  ?No changes to medicines. Bisoprolol-hydrochlorothiazide 5/6.25 mg daily and aspirin 81 mg daily  ?Continue to work on eating a healthy diet and exercise.  ?Labs to be done next week.  ?  ?  ? Relevant Orders  ? CBC with Differential/Platelet  ? Comprehensive metabolic panel  ? TSH  ?  ? Digestive  ? GERD without esophagitis  ?  Well controlled on omeprazole  20 mg twice daily.  ? ?  ?  ?  ? Endocrine  ? Diabetic glomerulopathy (HCC) - Primary  ?   ?Control: good ?Recommend check sugars fasting daily. ?Recommend check feet  daily. ?Recommend annual eye exams. ?Medicines: Continue synjardy xr 12.04/999 mg one twice daily, recently restarted trulicity 4.5 mg once weekly, and kerendia 10 mg daily. ?Continue to work on eating a healthy diet and e

## 2022-04-19 ENCOUNTER — Encounter: Payer: Self-pay | Admitting: Family Medicine

## 2022-04-19 NOTE — Assessment & Plan Note (Addendum)
Well controlled.  ?No changes to medicines. Bisoprolol-hydrochlorothiazide 5/6.25 mg daily and aspirin 81 mg daily  ?Continue to work on eating a healthy diet and exercise.  ?Labs to be done next week.  ?

## 2022-04-19 NOTE — Assessment & Plan Note (Addendum)
?  Control: good ?Recommend check sugars fasting daily. ?Recommend check feet daily. ?Recommend annual eye exams. ?Medicines: Continue synjardy xr 12.04/999 mg one twice daily, recently restarted trulicity 4.5 mg once weekly, and kerendia 10 mg daily. ?Continue to work on eating a healthy diet and exercise.  ?Labs to be drawn next week.  ? ?

## 2022-04-19 NOTE — Assessment & Plan Note (Signed)
Well controlled on omeprazole 20 mg twice daily. 

## 2022-04-19 NOTE — Assessment & Plan Note (Addendum)
Well controlled.  ?No changes to medicines. In vascepa 1 gm 2 capsules twice daily and atorvastatin 20 mg once daily ?Continue to work on eating a healthy diet and exercise.  ?Labs to be done next week ?

## 2022-04-24 ENCOUNTER — Other Ambulatory Visit: Payer: Medicare Other

## 2022-04-24 DIAGNOSIS — E782 Mixed hyperlipidemia: Secondary | ICD-10-CM

## 2022-04-24 DIAGNOSIS — I152 Hypertension secondary to endocrine disorders: Secondary | ICD-10-CM

## 2022-04-24 DIAGNOSIS — E1121 Type 2 diabetes mellitus with diabetic nephropathy: Secondary | ICD-10-CM

## 2022-04-25 LAB — CBC WITH DIFFERENTIAL/PLATELET
Basophils Absolute: 0 10*3/uL (ref 0.0–0.2)
Basos: 0 %
EOS (ABSOLUTE): 0.4 10*3/uL (ref 0.0–0.4)
Eos: 4 %
Hematocrit: 38.9 % (ref 34.0–46.6)
Hemoglobin: 12.1 g/dL (ref 11.1–15.9)
Immature Grans (Abs): 0 10*3/uL (ref 0.0–0.1)
Immature Granulocytes: 0 %
Lymphocytes Absolute: 3.6 10*3/uL — ABNORMAL HIGH (ref 0.7–3.1)
Lymphs: 39 %
MCH: 24.7 pg — ABNORMAL LOW (ref 26.6–33.0)
MCHC: 31.1 g/dL — ABNORMAL LOW (ref 31.5–35.7)
MCV: 79 fL (ref 79–97)
Monocytes Absolute: 0.6 10*3/uL (ref 0.1–0.9)
Monocytes: 7 %
Neutrophils Absolute: 4.4 10*3/uL (ref 1.4–7.0)
Neutrophils: 50 %
Platelets: 370 10*3/uL (ref 150–450)
RBC: 4.9 x10E6/uL (ref 3.77–5.28)
RDW: 15.8 % — ABNORMAL HIGH (ref 11.7–15.4)
WBC: 9.1 10*3/uL (ref 3.4–10.8)

## 2022-04-25 LAB — COMPREHENSIVE METABOLIC PANEL
ALT: 16 IU/L (ref 0–32)
AST: 17 IU/L (ref 0–40)
Albumin/Globulin Ratio: 1.4 (ref 1.2–2.2)
Albumin: 4.3 g/dL (ref 3.8–4.8)
Alkaline Phosphatase: 86 IU/L (ref 44–121)
BUN/Creatinine Ratio: 24 (ref 12–28)
BUN: 21 mg/dL (ref 8–27)
Bilirubin Total: 0.3 mg/dL (ref 0.0–1.2)
CO2: 24 mmol/L (ref 20–29)
Calcium: 10 mg/dL (ref 8.7–10.3)
Chloride: 97 mmol/L (ref 96–106)
Creatinine, Ser: 0.86 mg/dL (ref 0.57–1.00)
Globulin, Total: 3 g/dL (ref 1.5–4.5)
Glucose: 131 mg/dL — ABNORMAL HIGH (ref 70–99)
Potassium: 4.7 mmol/L (ref 3.5–5.2)
Sodium: 138 mmol/L (ref 134–144)
Total Protein: 7.3 g/dL (ref 6.0–8.5)
eGFR: 74 mL/min/{1.73_m2} (ref >59)

## 2022-04-25 LAB — LIPID PANEL
Chol/HDL Ratio: 3.1 ratio (ref 0.0–4.4)
Cholesterol, Total: 154 mg/dL (ref 100–199)
HDL: 50 mg/dL (ref >39)
LDL Chol Calc (NIH): 73 mg/dL (ref 0–99)
Triglycerides: 188 mg/dL — ABNORMAL HIGH (ref 0–149)
VLDL Cholesterol Cal: 31 mg/dL (ref 5–40)

## 2022-04-25 LAB — TSH: TSH: 1.1 u[IU]/mL (ref 0.450–4.500)

## 2022-04-25 LAB — HEMOGLOBIN A1C
Est. average glucose Bld gHb Est-mCnc: 154 mg/dL
Hgb A1c MFr Bld: 7 % — ABNORMAL HIGH (ref 4.8–5.6)

## 2022-04-25 LAB — CARDIOVASCULAR RISK ASSESSMENT

## 2022-04-28 ENCOUNTER — Other Ambulatory Visit: Payer: Self-pay

## 2022-04-28 ENCOUNTER — Telehealth: Payer: Self-pay

## 2022-04-28 MED ORDER — FENOFIBRATE 160 MG PO TABS
160.0000 mg | ORAL_TABLET | Freq: Every day | ORAL | 1 refills | Status: DC
Start: 2022-04-28 — End: 2022-05-05

## 2022-04-28 NOTE — Progress Notes (Signed)
Blood count normal.  ?Liver function normal.  ?Kidney function normal.  ?Thyroid function normal.  ?Cholesterol: trigs up a little. Recommend add fenofibrate 160 mg daily to current medications. ?HBA1C: 7. Worsened from 6.5. continue all current medications.  ? Recommend continue to work on eating healthy diet and exercise. ?

## 2022-04-28 NOTE — Telephone Encounter (Signed)
Left voicemail for patient to call back and schedule AWV appointment

## 2022-05-05 ENCOUNTER — Other Ambulatory Visit: Payer: Self-pay

## 2022-05-05 MED ORDER — ICOSAPENT ETHYL 1 G PO CAPS: 2.0000 g | ORAL_CAPSULE | Freq: Two times a day (BID) | ORAL | 2 refills | Status: DC

## 2022-05-05 MED ORDER — FENOFIBRATE 160 MG PO TABS: 160.0000 mg | ORAL_TABLET | Freq: Every day | ORAL | 1 refills | Status: DC

## 2022-05-05 NOTE — Telephone Encounter (Signed)
[facility]. Refill sent to pharmacy.  ? ?Fenofibrate and Vascepa ?

## 2022-05-11 ENCOUNTER — Other Ambulatory Visit: Payer: Self-pay

## 2022-05-11 MED ORDER — FENOFIBRATE 160 MG PO TABS
160.0000 mg | ORAL_TABLET | Freq: Every day | ORAL | 1 refills | Status: DC
Start: 2022-05-11 — End: 2022-07-28

## 2022-05-11 MED ORDER — ICOSAPENT ETHYL 1 G PO CAPS: 2.0000 g | ORAL_CAPSULE | Freq: Two times a day (BID) | ORAL | 0 refills | Status: DC

## 2022-05-27 ENCOUNTER — Other Ambulatory Visit: Payer: Self-pay | Admitting: Physician Assistant

## 2022-05-28 ENCOUNTER — Other Ambulatory Visit: Payer: Self-pay | Admitting: Family Medicine

## 2022-05-29 ENCOUNTER — Ambulatory Visit: Payer: Managed Care, Other (non HMO) | Admitting: Family Medicine

## 2022-05-29 ENCOUNTER — Encounter: Payer: Self-pay | Admitting: Family Medicine

## 2022-05-29 VITALS — Ht 64.0 in | Wt 145.0 lb

## 2022-05-29 DIAGNOSIS — Z0000 Encounter for general adult medical examination without abnormal findings: Secondary | ICD-10-CM

## 2022-05-29 NOTE — Patient Instructions (Signed)
[name] , Thank you for taking time to come for your Medicare Wellness Visit. I appreciate your ongoing commitment to your health goals. Please review the following plan we discussed and let me know if I can assist you in the future.   Screening recommendations/referrals: Colonoscopy: Up to date Mammogram: Up to date Bone Density: Up to date Please restart taking your calcium and vitamin D daily. Recommended yearly ophthalmology/optometry visit for glaucoma screening and checkup Recommended yearly dental visit for hygiene and checkup  Vaccinations: Influenza vaccine: Due in Aug 2023 Pneumococcal vaccine: Completed Tdap vaccine: This is due Shingles vaccine: Think about this- you can get it at any pharmacy through your insurance    Advanced directives: Declined, we can get you paperwork at anytime.  Conditions/risks identified: Need eye appt since you are diabetic     Preventive Care 65 Years and Older, Female Preventive care refers to lifestyle choices and visits with your health care provider that can promote health and wellness. What does preventive care include? A yearly physical exam. This is also called an annual well check. Dental exams once or twice a year. Routine eye exams. Ask your health care provider how often you should have your eyes checked. Personal lifestyle choices, including: Daily care of your teeth and gums. Regular physical activity. Eating a healthy diet. Avoiding tobacco and drug use. Limiting alcohol use. Practicing safe sex. Taking low-dose aspirin every day. Taking vitamin and mineral supplements as recommended by your health care provider. What happens during an annual well check? The services and screenings done by your health care provider during your annual well check will depend on your age, overall health, lifestyle risk factors, and family history of disease. Counseling  Your health care provider may ask you questions about  your: Alcohol use. Tobacco use. Drug use. Emotional well-being. Home and relationship well-being. Sexual activity. Eating habits. History of falls. Memory and ability to understand (cognition). Work and work Astronomer. Reproductive health. Screening  You may have the following tests or measurements: Height, weight, and BMI. Blood pressure. Lipid and cholesterol levels. These may be checked every 5 years, or more frequently if you are over 53 years old. Skin check. Lung cancer screening. You may have this screening every year starting at age 24 if you have a 30-pack-year history of smoking and currently smoke or have quit within the past 15 years. Fecal occult blood test (FOBT) of the stool. You may have this test every year starting at age 55. Flexible sigmoidoscopy or colonoscopy. You may have a sigmoidoscopy every 5 years or a colonoscopy every 10 years starting at age 63. Hepatitis C blood test. Hepatitis B blood test. Sexually transmitted disease (STD) testing. Diabetes screening. This is done by checking your blood sugar (glucose) after you have not eaten for a while (fasting). You may have this done every 1-3 years. Bone density scan. This is done to screen for osteoporosis. You may have this done starting at age 68. Mammogram. This may be done every 1-2 years. Talk to your health care provider about how often you should have regular mammograms. Talk with your health care provider about your test results, treatment options, and if necessary, the need for more tests. Vaccines  Your health care provider may recommend certain vaccines, such as: Influenza vaccine. This is recommended every year. Tetanus, diphtheria, and acellular pertussis (Tdap, Td) vaccine. You may need a Td booster every 10 years. Zoster vaccine. You may need this after age 66. Pneumococcal 13-valent conjugate (  PCV13) vaccine. One dose is recommended after age 5. Pneumococcal polysaccharide (PPSV23) vaccine.  One dose is recommended after age 8. Talk to your health care provider about which screenings and vaccines you need and how often you need them. This information is not intended to replace advice given to you by your health care provider. Make sure you discuss any questions you have with your health care provider. Document Released: 01/10/2016 Document Revised: 09/02/2016 Document Reviewed: 10/15/2015 Elsevier Interactive Patient Education  2017 [employer] Prevention in the Home Falls can cause injuries. They can happen to people of all ages. There are many things you can do to make your home safe and to help prevent falls. What can I do on the outside of my home? Regularly fix the edges of walkways and driveways and fix any cracks. Remove anything that might make you trip as you walk through a door, such as a raised step or threshold. Trim any bushes or trees on the path to your home. Use bright outdoor lighting. Clear any walking paths of anything that might make someone trip, such as rocks or tools. Regularly check to see if handrails are loose or broken. Make sure that both sides of any steps have handrails. Any raised decks and porches should have guardrails on the edges. Have any leaves, snow, or ice cleared regularly. Use sand or salt on walking paths during winter. Clean up any spills in your garage right away. This includes oil or grease spills. What can I do in the bathroom? Use night lights. Install grab bars by the toilet and in the tub and shower. Do not use towel bars as grab bars. Use non-skid mats or decals in the tub or shower. If you need to sit down in the shower, use a plastic, non-slip stool. Keep the floor dry. Clean up any water that spills on the floor as soon as it happens. Remove soap buildup in the tub or shower regularly. Attach bath mats securely with double-sided non-slip rug tape. Do not have throw rugs and other things on the floor that can make  you trip. What can I do in the bedroom? Use night lights. Make sure that you have a light by your bed that is easy to reach. Do not use any sheets or blankets that are too big for your bed. They should not hang down onto the floor. Have a firm chair that has side arms. You can use this for support while you get dressed. Do not have throw rugs and other things on the floor that can make you trip. What can I do in the kitchen? Clean up any spills right away. Avoid walking on wet floors. Keep items that you use a lot in easy-to-reach places. If you need to reach something above you, use a strong step stool that has a grab bar. Keep electrical cords out of the way. Do not use floor polish or wax that makes floors slippery. If you must use wax, use non-skid floor wax. Do not have throw rugs and other things on the floor that can make you trip. What can I do with my stairs? Do not leave any items on the stairs. Make sure that there are handrails on both sides of the stairs and use them. Fix handrails that are broken or loose. Make sure that handrails are as long as the stairways. Check any carpeting to make sure that it is firmly attached to the stairs. Fix any carpet that is  loose or worn. Avoid having throw rugs at the top or bottom of the stairs. If you do have throw rugs, attach them to the floor with carpet tape. Make sure that you have a light switch at the top of the stairs and the bottom of the stairs. If you do not have them, ask someone to add them for you. What else can I do to help prevent falls? Wear shoes that: Do not have high heels. Have rubber bottoms. Are comfortable and fit you well. Are closed at the toe. Do not wear sandals. If you use a stepladder: Make sure that it is fully opened. Do not climb a closed stepladder. Make sure that both sides of the stepladder are locked into place. Ask someone to hold it for you, if possible. Clearly mark and make sure that you can  see: Any grab bars or handrails. First and last steps. Where the edge of each step is. Use tools that help you move around (mobility aids) if they are needed. These include: Canes. Walkers. Scooters. Crutches. Turn on the lights when you go into a dark area. Replace any light bulbs as soon as they burn out. Set up your furniture so you have a clear path. Avoid moving your furniture around. If any of your floors are uneven, fix them. If there are any pets around you, be aware of where they are. Review your medicines with your doctor. Some medicines can make you feel dizzy. This can increase your chance of falling. Ask your doctor what other things that you can do to help prevent falls. This information is not intended to replace advice given to you by your health care provider. Make sure you discuss any questions you have with your health care provider. Document Released: 10/10/2009 Document Revised: 05/21/2016 Document Reviewed: 01/18/2015 Elsevier Interactive Patient Education  2017 [employer].

## 2022-05-29 NOTE — Progress Notes (Addendum)
I connected with  Rachel Hensley on 05/29/22 by a audio enabled telemedicine application and verified that I am speaking with the correct person using two identifiers.  Patient Location: Home  Provider Location: Home Office  I discussed the limitations of evaluation and management by telemedicine. The patient expressed understanding and agreed to proceed.   Subjective:   Rachel Hensley is a 66 y.o. female who presents for an Initial Medicare Annual Wellness Visit.   Cardiac Risk Factors include: advanced age (>62men, >61 women);diabetes mellitus;dyslipidemia     Objective:    There were no vitals filed for this visit. There is no height or weight on file to calculate BMI.      View : No data to display.          Current Medications (verified) Outpatient Encounter Medications as of 05/29/2022  Medication Sig   aspirin 81 MG chewable tablet Chew 81 mg by mouth daily.   atorvastatin (LIPITOR) 20 MG tablet TAKE 1 TABLET DAILY   bisoprolol-hydrochlorothiazide (ZIAC) 5-6.25 MG tablet TAKE 1 TABLET DAILY   cetirizine (ZYRTEC) 10 MG tablet Take 10 mg by mouth daily.   EPINEPHrine 0.3 mg/0.3 mL IJ SOAJ injection Inject 0.3 mg into the muscle once.   fenofibrate 160 MG tablet Take 1 tablet (160 mg total) by mouth daily.   Finerenone (KERENDIA) 10 MG TABS Take 10 mg by mouth daily in the afternoon.   icosapent Ethyl (VASCEPA) 1 g capsule Take 2 capsules (2 g total) by mouth 2 (two) times daily.   omeprazole (PRILOSEC) 20 MG capsule TAKE 1 CAPSULE TWICE A DAY   ondansetron (ZOFRAN ODT) 4 MG disintegrating tablet Take 1 tablet (4 mg total) by mouth every 8 (eight) hours as needed for nausea or vomiting.   promethazine (PHENERGAN) 25 MG tablet Take 1 tablet (25 mg total) by mouth every 8 (eight) hours as needed for nausea or vomiting.   SYNJARDY XR 12.04-999 MG TB24 TAKE 1 TABLET TWICE A DAY   TRULICITY 4.5 MG/0.5ML SOPN INJECT 1 SYRINGE SUBCUTANEOUSLY ONCE A WEEK AS  DIRECTED    [DISCONTINUED] Dulaglutide (TRULICITY) 4.5 MG/0.5ML SOPN Inject 4.5 mg as directed once a week.   No facility-administered encounter medications on file as of 05/29/2022.    Allergies (verified) Actos [pioglitazone], Lisinopril, and Prednisone   History: Past Medical History:  Diagnosis Date   Hypoglycemia due to type 2 diabetes mellitus (HCC) 08/21/2020   Mixed hyperlipidemia    Nontoxic multinodular goiter    Other vitamin B12 deficiency anemias    Primary insomnia 05/20/2020   Solitary pulmonary nodule    Type 2 diabetes mellitus with other specified complication [facility])    Past Surgical History:  Procedure Laterality Date   ABDOMINAL HYSTERECTOMY  06/1993   partial; still has both ovaries   HYSTEROTOMY     partial   Family History  Problem Relation Age of Onset   Coronary artery disease Mother    Glaucoma Mother    Colon cancer Father    Diabetes Sister    Atrial fibrillation Sister    Cancer Brother        renal   Kidney cancer Brother    Diabetes type II Brother    Coronary artery disease Brother    Diabetes Brother    Diabetes Brother    Coronary artery disease Brother    Asthma Son        childhood asthma   Arrhythmia Son    Atrial fibrillation Son  Parkinsonism Son    Eczema Grandchild    Atopy Grandchild    Allergic rhinitis Neg Hx    Angioedema Neg Hx    Immunodeficiency Neg Hx    Urticaria Neg Hx    Breast cancer Neg Hx    Social History   Socioeconomic History   Marital status: Widowed    Spouse name: Not on file   Number of children: 3   Years of education: Not on file   Highest education level: Not on file  Occupational History   Occupation: Sewer  Tobacco Use   Smoking status: Never   Smokeless tobacco: Never  Vaping Use   Vaping Use: Never used  Substance and Sexual Activity   Alcohol use: Never   Drug use: Never   Sexual activity: Not on file  Other Topics Concern   Not on file  Social History Narrative   Not on file   Social  Determinants of Health   Financial Resource Strain: Not on file  Food Insecurity: No Food Insecurity   Worried About Running Out of Food in the Last Year: Never true   Ran Out of Food in the Last Year: Never true  Transportation Needs: No Transportation Needs   Lack of Transportation (Medical): No   Lack of Transportation (Non-Medical): No  Physical Activity: Not on file  Stress: Not on file  Social Connections: Not on file    Tobacco Counseling Counseling given: Not Answered   Clinical Intake:                 Diabetic?yes          Activities of Daily Living    05/29/2022   11:02 AM  In your present state of health, do you have any difficulty performing the following activities:  Hearing? 0  Vision? 0  Difficulty concentrating or making decisions? 0  Walking or climbing stairs? 0  Dressing or bathing? 0  Doing errands, shopping? 0  Preparing Food and eating ? N  Using the Toilet? N  In the past six months, have you accidently leaked urine? N  Do you have problems with loss of bowel control? N  Managing your Medications? N  Managing your Finances? N  Housekeeping or managing your Housekeeping? N    Patient Care Team: CoxFritzi Mandes, MD as PCP - General (Family Medicine)  Indicate any recent Medical Services you may have received from other than [facility] providers in the past year (date may be approximate).     Assessment:   This is a routine wellness examination for Rachel Hensley.  Hearing/Vision screen No results found.  Dietary issues and exercise activities discussed: Current Exercise Habits: The patient does not participate in regular exercise at present   Goals Addressed   None   Depression Screen    05/29/2022   11:04 AM 04/15/2022    7:51 AM 01/21/2022    7:38 AM 07/07/2021   11:21 AM 08/21/2020    8:59 AM 05/20/2020    7:55 AM  PHQ 2/9 Scores  PHQ - 2 Score 0 0 0 6 2 0  PHQ- 9 Score    22 8     Fall Risk    05/29/2022   11:04 AM 04/15/2022     7:50 AM 01/21/2022    7:38 AM 07/07/2021   11:21 AM 08/21/2020    7:58 AM  Fall Risk   Falls in the past year? 0 0 0 0 1  Number falls in past yr: 0  0 0 0 0  Injury with Fall? 1 0 0 0 1  Risk for fall due to : No Fall Risks   History of fall(s)   Follow up Falls evaluation completed;Education provided Falls evaluation completed Falls evaluation completed      FALL RISK PREVENTION PERTAINING TO THE HOME:  Any stairs in or around the home? Yes  If so, are there any without handrails? Yes  Home free of loose throw rugs in walkways, pet beds, electrical cords, etc? Yes  Adequate lighting in your home to reduce risk of falls? Yes   ASSISTIVE DEVICES UTILIZED TO PREVENT FALLS:  Life alert? No  Use of a cane, walker or w/c? No  Grab bars in the bathroom? No  Shower chair or bench in shower? No  Elevated toilet seat or a handicapped toilet? No     Cognitive Function:        Immunizations Immunization History  Administered Date(s) Administered   Fluad Quad(high Dose 65+) 11/26/2020, 10/15/2021   Influenza-Unspecified 09/28/2019   PNEUMOCOCCAL CONJUGATE-20 10/15/2021   Pneumococcal Polysaccharide-23 02/19/2020    TDAP status: Due, Education has been provided regarding the importance of this vaccine. Advised may receive this vaccine at local pharmacy or Health Dept. Aware to provide a copy of the vaccination record if obtained from local pharmacy or Health Dept. Verbalized acceptance and understanding.  Flu Vaccine status: Up to date  Pneumococcal vaccine status: Up to date  Covid-19 vaccine status: Declined, Education has been provided regarding the importance of this vaccine but patient still declined. Advised may receive this vaccine at local pharmacy or Health Dept.or vaccine clinic. Aware to provide a copy of the vaccination record if obtained from local pharmacy or Health Dept. Verbalized acceptance and understanding.  Qualifies for Shingles Vaccine? Yes   Zostavax  completed No   Shingrix Completed?: No.    Education has been provided regarding the importance of this vaccine. Patient has been advised to call insurance company to determine out of pocket expense if they have not yet received this vaccine. Advised may also receive vaccine at local pharmacy or Health Dept. Verbalized acceptance and understanding.  Screening Tests Health Maintenance  Topic Date Due   COVID-19 Vaccine (1) Never done   Hepatitis C Screening  Never done   TETANUS/TDAP  Never done   Fecal DNA (Cologuard)  Never done   Zoster Vaccines- Shingrix (1 of 2) Never done   INFLUENZA VACCINE  07/28/2022   HEMOGLOBIN A1C  10/24/2022   MAMMOGRAM  12/23/2022   OPHTHALMOLOGY EXAM  03/19/2023   FOOT EXAM  04/16/2023   Pneumonia Vaccine 6+ Years old  Completed   DEXA SCAN  Completed   HPV VACCINES  Aged Out   COLONOSCOPY (Pts 45-19yrs Insurance coverage will need to be confirmed)  Discontinued    Health Maintenance  Health Maintenance Due  Topic Date Due   COVID-19 Vaccine (1) Never done   Hepatitis C Screening  Never done   TETANUS/TDAP  Never done   Fecal DNA (Cologuard)  Never done   Zoster Vaccines- Shingrix (1 of 2) Never done    Colorectal cancer screening: Type of screening: Colonoscopy. Completed 03/2021. Repeat every 5 years  Mammogram status: Completed 11/2021. Repeat every year  Bone Density status: Completed 10/2020. Results reflect: Bone density results: OSTEOPENIA. Repeat every 2 years.  Lung Cancer Screening: (Low Dose CT Chest recommended if Age 41-80 years, 30 pack-year currently smoking OR have quit w/in 15years.) does not qualify.  Lung Cancer Screening Referral: n/a  Additional Screening:  Hepatitis C Screening: does qualify; Completed needs to be ordered on next labs  Vision Screening: Recommended annual ophthalmology exams for early detection of glaucoma and other disorders of the eye. Is the patient up to date with their annual eye exam?  No   Who is the provider or what is the name of the office in which the patient attends annual eye exams? [facility] If pt is not established with a provider, would they like to be referred to a provider to establish care?    Dental Screening: Recommended annual dental exams for proper oral hygiene  Community Resource Referral / Chronic Care Management: CRR required this visit?  No   CCM required this visit?  No      Plan:     I have personally reviewed and noted the following in the patient's chart:   Medical and social history Use of alcohol, tobacco or illicit drugs  Current medications and supplements including opioid prescriptions. Patient is not currently taking opioid prescriptions. Functional ability and status Nutritional status Physical activity Advanced directives List of other physicians Hospitalizations, surgeries, and ER visits in previous 12 months Vitals Screenings to include cognitive, depression, and falls Referrals and appointments  In addition, I have reviewed and discussed with patient certain preventive protocols, quality metrics, and best practice recommendations. A written personalized care plan for preventive services as well as general preventive health recommendations were provided to patient.     Freddy FinnerHannah M Gabryel Talamo, NP   05/29/2022

## 2022-07-27 ENCOUNTER — Other Ambulatory Visit: Payer: Self-pay | Admitting: Family Medicine

## 2022-07-27 NOTE — Progress Notes (Unsigned)
Subjective:  Patient ID: Rachel Hensley, female    DOB: [birth year]  Age: 66 y.o. MRN: [id number]  Chief Complaint  Patient presents with   Diabetes   Hyperlipidemia   Hypertension   HPI: Diabetes:  Complications: nephropathy.  Glucose checking: once every other week.  Glucose logs: 120-280. Went up when had knee injection.  Hypoglycemia: no Most recent A1C: 7. Current medications: Trulicity 4.5 mg weekly, synjardy xr 12.04/999 mg one twice a day, Took some glimeperide that she had left over when sugar went up. Kerendia 10 mg once daily. Aspirin 81 mg daily. Last Eye Exam: 02/2022. Foot checks: daily. Was exercising regularly until May and had severe knee pain. Returned to Dr Lynnette Caffey and got a cortisone injection and he restarted mobic. This helped.   Hyperlipidemia: Current medications: Gradually increased vascepa 1 gm 2 capsules twice a day and atorvastatin 20 mg once daily.  Stopped fenofibrate due to nausea and diarrhea.   Hypertension: Current medications: Bisoprolol-hydrochlorothiazide 5/6.25 mg once daily. Aspirin 81 mg daily.  GERD: omeprazole 20 mg twice daily. Unable to decrease or gets breakthrough gerd. Well controlled.   Diet: healthy Exercise: had to stop.   Current Outpatient Medications on File Prior to Visit  Medication Sig Dispense Refill   aspirin 81 MG chewable tablet Chew 81 mg by mouth daily.     atorvastatin (LIPITOR) 20 MG tablet TAKE 1 TABLET DAILY 90 tablet 3   bisoprolol-hydrochlorothiazide (ZIAC) 5-6.25 MG tablet TAKE 1 TABLET DAILY 90 tablet 0   cetirizine (ZYRTEC) 10 MG tablet Take 10 mg by mouth daily.     EPINEPHrine 0.3 mg/0.3 mL IJ SOAJ injection Inject 0.3 mg into the muscle once.     Finerenone (KERENDIA) 10 MG TABS Take 10 mg by mouth daily in the afternoon. 90 tablet 2   icosapent Ethyl (VASCEPA) 1 g capsule Take 2 capsules (2 g total) by mouth 2 (two) times daily. 360 capsule 0   omeprazole (PRILOSEC) 20 MG capsule TAKE 1 CAPSULE TWICE  A DAY 180 capsule 1   ondansetron (ZOFRAN ODT) 4 MG disintegrating tablet Take 1 tablet (4 mg total) by mouth every 8 (eight) hours as needed for nausea or vomiting. 30 tablet 0   promethazine (PHENERGAN) 25 MG tablet Take 1 tablet (25 mg total) by mouth every 8 (eight) hours as needed for nausea or vomiting. 20 tablet 0   SYNJARDY XR 12.04-999 MG TB24 TAKE 1 TABLET TWICE A DAY 180 tablet 0   TRULICITY 4.5 MG/0.5ML SOPN INJECT 1 SYRINGE SUBCUTANEOUSLY ONCE A WEEK AS  DIRECTED 12 mL 0   meloxicam (MOBIC) 15 MG tablet Take 15 mg by mouth daily.     No current facility-administered medications on file prior to visit.   Past Medical History:  Diagnosis Date   Hypoglycemia due to type 2 diabetes mellitus (HCC) 08/21/2020   Mixed hyperlipidemia    Nontoxic multinodular goiter    Other vitamin B12 deficiency anemias    Primary insomnia 05/20/2020   Solitary pulmonary nodule    Type 2 diabetes mellitus with other specified complication [facility])    Past Surgical History:  Procedure Laterality Date   ABDOMINAL HYSTERECTOMY  06/1993   partial; still has both ovaries   HYSTEROTOMY     partial    Family History  Problem Relation Age of Onset   Coronary artery disease Mother    Glaucoma Mother    Colon cancer Father    Diabetes Sister    Atrial fibrillation Sister  Cancer Brother        renal   Kidney cancer Brother    Diabetes type II Brother    Coronary artery disease Brother    Diabetes Brother    Diabetes Brother    Coronary artery disease Brother    Asthma Son        childhood asthma   Arrhythmia Son    Atrial fibrillation Son    Parkinsonism Son    Eczema Grandchild    Atopy Grandchild    Allergic rhinitis Neg Hx    Angioedema Neg Hx    Immunodeficiency Neg Hx    Urticaria Neg Hx    Breast cancer Neg Hx    Social History   Socioeconomic History   Marital status: Widowed    Spouse name: Not on file   Number of children: 3   Years of education: Not on file   Highest  education level: Not on file  Occupational History   Occupation: Sewer  Tobacco Use   Smoking status: Never   Smokeless tobacco: Never  Vaping Use   Vaping Use: Never used  Substance and Sexual Activity   Alcohol use: Never   Drug use: Never   Sexual activity: Not on file  Other Topics Concern   Not on file  Social History Narrative   Not on file   Social Determinants of Health   Financial Resource Strain: Low Risk  (05/29/2022)   Overall Financial Resource Strain (CARDIA)    Difficulty of Paying Living Expenses: Not hard at all  Food Insecurity: No Food Insecurity (05/29/2022)   Hunger Vital Sign    Worried About Running Out of Food in the Last Year: Never true    Ran Out of Food in the Last Year: Never true  Transportation Needs: No Transportation Needs (05/29/2022)   PRAPARE - Administrator, Civil Service (Medical): No    Lack of Transportation (Non-Medical): No  Physical Activity: Sufficiently Active (05/29/2022)   Exercise Vital Sign    Days of Exercise per Week: 5 days    Minutes of Exercise per Session: 30 min  Stress: No Stress Concern Present (05/29/2022)   [employer] of Occupational Health - Occupational Stress Questionnaire    Feeling of Stress : Only a little  Social Connections: Moderately Isolated (05/29/2022)   Social Connection and Isolation Panel [NHANES]    Frequency of Communication with Friends and Family: More than three times a week    Frequency of Social Gatherings with Friends and Family: More than three times a week    Attends Religious Services: More than 4 times per year    Active Member of [employer] or Organizations: No    Attends Banker Meetings: Never    Marital Status: Widowed    Review of Systems  Constitutional:  Negative for appetite change, fatigue and fever.  HENT:  Negative for congestion, ear pain, sinus pressure and sore throat.   Respiratory:  Negative for cough, chest tightness, shortness of breath and  wheezing.   Cardiovascular:  Negative for chest pain and palpitations.  Gastrointestinal:  Negative for abdominal pain, constipation, diarrhea, nausea and vomiting.  Endocrine: Positive for polydipsia and polyuria. Negative for polyphagia.  Genitourinary:  Negative for dysuria and hematuria.  Musculoskeletal:  Positive for arthralgias (knee pain). Negative for back pain, joint swelling and myalgias.  Skin:  Negative for rash.  Neurological:  Negative for dizziness, weakness and headaches.  Psychiatric/Behavioral:  Negative for dysphoric mood. The patient  is not nervous/anxious.      Objective:  BP 118/60   Pulse 89   Temp (!) 97.1 F (36.2 C)   Ht 5' 3.5" (1.613 m)   Wt 152 lb (68.9 kg)   SpO2 99%   BMI 26.50 kg/m      07/28/2022    7:38 AM 05/29/2022    1:12 PM 04/15/2022    7:46 AM  BP/Weight  Systolic BP 118  272  Diastolic BP 60  60  Wt. (Lbs) 152 145 148.2  BMI 26.5 kg/m2 24.89 kg/m2 26.67 kg/m2    Physical Exam Vitals reviewed.  Constitutional:      Appearance: Normal appearance. She is normal weight.  Cardiovascular:     Rate and Rhythm: Normal rate and regular rhythm.     Pulses: Normal pulses.     Heart sounds: Normal heart sounds.  Pulmonary:     Effort: Pulmonary effort is normal.     Breath sounds: Normal breath sounds.  Abdominal:     General: Abdomen is flat. Bowel sounds are normal.     Palpations: Abdomen is soft.  Neurological:     Mental Status: She is alert and oriented to person, place, and time.  Psychiatric:        Mood and Affect: Mood normal.        Behavior: Behavior normal.     Diabetic Foot Exam - Simple   Simple Foot Form Diabetic Foot exam was performed with the following findings: Yes 07/28/2022  8:09 AM  Visual Inspection No deformities, no ulcerations, no other skin breakdown bilaterally: Yes Sensation Testing Intact to touch and monofilament testing bilaterally: Yes Pulse Check Posterior Tibialis and Dorsalis pulse intact  bilaterally: Yes Comments      Lab Results  Component Value Date   WBC 9.1 04/24/2022   HGB 12.1 04/24/2022   HCT 38.9 04/24/2022   PLT 370 04/24/2022   GLUCOSE 131 (H) 04/24/2022   CHOL 154 04/24/2022   TRIG 188 (H) 04/24/2022   HDL 50 04/24/2022   LDLCALC 73 04/24/2022   ALT 16 04/24/2022   AST 17 04/24/2022   NA 138 04/24/2022   K 4.7 04/24/2022   CL 97 04/24/2022   CREATININE 0.86 04/24/2022   BUN 21 04/24/2022   CO2 24 04/24/2022   TSH 1.100 04/24/2022   HGBA1C 7.0 (H) 04/24/2022   MICROALBUR 150 10/27/2021      Assessment & Plan:   Problem List Items Addressed This Visit       Cardiovascular and Mediastinum   Hypertension associated with diabetes (HCC) (Chronic)    Well controlled.  No changes to medicines.  Continue to work on eating a healthy diet and exercise.  Labs drawn today.        Relevant Orders   Comprehensive metabolic panel     Digestive   GERD without esophagitis    The current medical regimen is effective;  continue present plan and medications.         Endocrine   Diabetic glomerulopathy (HCC) - Primary    Control: good Recommend check feet daily. Recommend annual eye exams. Medicines: no changes Continue to work on eating a healthy diet and exercise.  Labs drawn today.         Relevant Orders   Hemoglobin A1c   Microalbumin / creatinine urine ratio     Other   Mixed hyperlipidemia    Await labs/testing for assessment and recommendations. Recommend continue to work on eating healthy diet  and exercise. Labs drawn      Relevant Orders   CBC With Diff/Platelet   Lipid panel   ACEI/ARB contraindicated  .  No orders of the defined types were placed in this encounter.   Orders Placed This Encounter  Procedures   CBC With Diff/Platelet   Comprehensive metabolic panel   Lipid panel   Hemoglobin A1c   Microalbumin / creatinine urine ratio     Follow-up: Return in about 4 months (around 11/27/2022) for chronic  fasting.  An After Visit Summary was printed and given to the patient.  Blane Ohara, MD [facility] [phone]

## 2022-07-28 ENCOUNTER — Ambulatory Visit: Payer: Managed Care, Other (non HMO) | Admitting: Family Medicine

## 2022-07-28 ENCOUNTER — Encounter: Payer: Self-pay | Admitting: Family Medicine

## 2022-07-28 VITALS — BP 118/60 | HR 89 | Temp 97.1°F | Ht 63.5 in | Wt 152.0 lb

## 2022-07-28 DIAGNOSIS — Z5309 Procedure and treatment not carried out because of other contraindication: Secondary | ICD-10-CM

## 2022-07-28 DIAGNOSIS — E1159 Type 2 diabetes mellitus with other circulatory complications: Secondary | ICD-10-CM

## 2022-07-28 DIAGNOSIS — E782 Mixed hyperlipidemia: Secondary | ICD-10-CM | POA: Diagnosis not present

## 2022-07-28 DIAGNOSIS — E1121 Type 2 diabetes mellitus with diabetic nephropathy: Secondary | ICD-10-CM | POA: Diagnosis not present

## 2022-07-28 DIAGNOSIS — K219 Gastro-esophageal reflux disease without esophagitis: Secondary | ICD-10-CM | POA: Diagnosis not present

## 2022-07-28 DIAGNOSIS — F5101 Primary insomnia: Secondary | ICD-10-CM

## 2022-07-28 DIAGNOSIS — I152 Hypertension secondary to endocrine disorders: Secondary | ICD-10-CM

## 2022-07-28 NOTE — Assessment & Plan Note (Signed)
Control: good ?Recommend check feet daily. ?Recommend annual eye exams. ?Medicines: no changes ?Continue to work on eating a healthy diet and exercise.  ?Labs drawn today.   ? ?

## 2022-07-28 NOTE — Assessment & Plan Note (Signed)
The current medical regimen is effective;  continue present plan and medications.  

## 2022-07-28 NOTE — Assessment & Plan Note (Signed)
Await labs/testing for assessment and recommendations. Recommend continue to work on eating healthy diet and exercise. Labs drawn

## 2022-07-28 NOTE — Assessment & Plan Note (Signed)
Well controlled.  ?No changes to medicines.  ?Continue to work on eating a healthy diet and exercise.  ?Labs drawn today.  ?

## 2022-07-29 LAB — COMPREHENSIVE METABOLIC PANEL
ALT: 19 IU/L (ref 0–32)
AST: 14 IU/L (ref 0–40)
Albumin/Globulin Ratio: 1.7 (ref 1.2–2.2)
Albumin: 4.5 g/dL (ref 3.9–4.9)
Alkaline Phosphatase: 69 IU/L (ref 44–121)
BUN/Creatinine Ratio: 22 (ref 12–28)
BUN: 17 mg/dL (ref 8–27)
Bilirubin Total: 0.3 mg/dL (ref 0.0–1.2)
CO2: 22 mmol/L (ref 20–29)
Calcium: 9.8 mg/dL (ref 8.7–10.3)
Chloride: 102 mmol/L (ref 96–106)
Creatinine, Ser: 0.79 mg/dL (ref 0.57–1.00)
Globulin, Total: 2.6 g/dL (ref 1.5–4.5)
Glucose: 191 mg/dL — ABNORMAL HIGH (ref 70–99)
Potassium: 4.8 mmol/L (ref 3.5–5.2)
Sodium: 142 mmol/L (ref 134–144)
Total Protein: 7.1 g/dL (ref 6.0–8.5)
eGFR: 82 mL/min/{1.73_m2} (ref >59)

## 2022-07-29 LAB — CBC WITH DIFF/PLATELET
Basophils Absolute: 0 10*3/uL (ref 0.0–0.2)
Basos: 1 %
EOS (ABSOLUTE): 0.2 10*3/uL (ref 0.0–0.4)
Eos: 3 %
Hematocrit: 39.6 % (ref 34.0–46.6)
Hemoglobin: 12.4 g/dL (ref 11.1–15.9)
Immature Grans (Abs): 0 10*3/uL (ref 0.0–0.1)
Immature Granulocytes: 1 %
Lymphocytes Absolute: 2.7 10*3/uL (ref 0.7–3.1)
Lymphs: 33 %
MCH: 25.6 pg — ABNORMAL LOW (ref 26.6–33.0)
MCHC: 31.3 g/dL — ABNORMAL LOW (ref 31.5–35.7)
MCV: 82 fL (ref 79–97)
Monocytes Absolute: 0.7 10*3/uL (ref 0.1–0.9)
Monocytes: 8 %
Neutrophils Absolute: 4.7 10*3/uL (ref 1.4–7.0)
Neutrophils: 54 %
Platelets: 366 10*3/uL (ref 150–450)
RBC: 4.84 x10E6/uL (ref 3.77–5.28)
RDW: 17.5 % — ABNORMAL HIGH (ref 11.7–15.4)
WBC: 8.4 10*3/uL (ref 3.4–10.8)

## 2022-07-29 LAB — HEMOGLOBIN A1C
Est. average glucose Bld gHb Est-mCnc: 174 mg/dL
Hgb A1c MFr Bld: 7.7 % — ABNORMAL HIGH (ref 4.8–5.6)

## 2022-07-29 LAB — LIPID PANEL
Chol/HDL Ratio: 4 ratio (ref 0.0–4.4)
Cholesterol, Total: 182 mg/dL (ref 100–199)
HDL: 45 mg/dL (ref >39)
LDL Chol Calc (NIH): 88 mg/dL (ref 0–99)
Triglycerides: 300 mg/dL — ABNORMAL HIGH (ref 0–149)
VLDL Cholesterol Cal: 49 mg/dL — ABNORMAL HIGH (ref 5–40)

## 2022-07-29 LAB — CARDIOVASCULAR RISK ASSESSMENT

## 2022-07-29 LAB — MICROALBUMIN / CREATININE URINE RATIO
Creatinine, Urine: 66.6 mg/dL
Microalb/Creat Ratio: 43 mg/g creat — ABNORMAL HIGH (ref 0–29)
Microalbumin, Urine: 28.4 ug/mL

## 2022-07-31 ENCOUNTER — Other Ambulatory Visit: Payer: Self-pay

## 2022-07-31 MED ORDER — TOUJEO SOLOSTAR 300 UNIT/ML ~~LOC~~ SOPN: 10.0000 [IU] | PEN_INJECTOR | Freq: Every day | SUBCUTANEOUS | 1 refills | Status: DC

## 2022-08-11 ENCOUNTER — Other Ambulatory Visit: Payer: Self-pay

## 2022-08-11 ENCOUNTER — Other Ambulatory Visit: Payer: Self-pay | Admitting: Family Medicine

## 2022-08-11 MED ORDER — KERENDIA 10 MG PO TABS: 10.0000 mg | ORAL_TABLET | Freq: Every day | ORAL | 1 refills | Status: DC

## 2022-08-11 MED ORDER — BISOPROLOL-HYDROCHLOROTHIAZIDE 5-6.25 MG PO TABS
1.0000 | ORAL_TABLET | Freq: Every day | ORAL | 1 refills | Status: DC
Start: 2022-08-11 — End: 2022-12-04

## 2022-08-11 NOTE — Telephone Encounter (Signed)
Patient was an employee for [employer] and the patient has lost her job and will be losing her insurance 08/27/22 . Patient is going to need as many medications refilled as possible before she loses her insurance this are the following refills that she is requesting [facility].

## 2022-08-12 MED ORDER — TRULICITY 4.5 MG/0.5ML ~~LOC~~ SOAJ
4.5000 mg | SUBCUTANEOUS | 0 refills | Status: DC
Start: 2022-08-12 — End: 2022-08-18

## 2022-08-18 ENCOUNTER — Other Ambulatory Visit: Payer: Self-pay | Admitting: Family Medicine

## 2022-08-18 ENCOUNTER — Other Ambulatory Visit: Payer: Self-pay

## 2022-08-18 MED ORDER — TRULICITY 4.5 MG/0.5ML ~~LOC~~ SOAJ: 4.5000 mg | SUBCUTANEOUS | 0 refills | Status: DC

## 2022-09-06 ENCOUNTER — Other Ambulatory Visit: Payer: Self-pay | Admitting: Family Medicine

## 2022-09-06 MED ORDER — TRULICITY 4.5 MG/0.5ML ~~LOC~~ SOAJ: 4.5000 mg | SUBCUTANEOUS | 0 refills | Status: DC

## 2022-09-21 ENCOUNTER — Telehealth: Payer: Self-pay

## 2022-09-21 DIAGNOSIS — Z7982 Long term (current) use of aspirin: Secondary | ICD-10-CM | POA: Diagnosis not present

## 2022-09-21 DIAGNOSIS — I249 Acute ischemic heart disease, unspecified: Secondary | ICD-10-CM | POA: Diagnosis not present

## 2022-09-21 DIAGNOSIS — Z79899 Other long term (current) drug therapy: Secondary | ICD-10-CM | POA: Diagnosis not present

## 2022-09-21 DIAGNOSIS — R0789 Other chest pain: Secondary | ICD-10-CM | POA: Diagnosis not present

## 2022-09-21 DIAGNOSIS — E119 Type 2 diabetes mellitus without complications: Secondary | ICD-10-CM | POA: Diagnosis not present

## 2022-09-21 DIAGNOSIS — E785 Hyperlipidemia, unspecified: Secondary | ICD-10-CM | POA: Diagnosis not present

## 2022-09-21 DIAGNOSIS — R000 Tachycardia, unspecified: Secondary | ICD-10-CM | POA: Diagnosis not present

## 2022-09-21 DIAGNOSIS — R002 Palpitations: Secondary | ICD-10-CM | POA: Diagnosis not present

## 2022-09-21 DIAGNOSIS — I10 Essential (primary) hypertension: Secondary | ICD-10-CM | POA: Diagnosis not present

## 2022-09-21 NOTE — Telephone Encounter (Signed)
Patient is complaining heart racing fast, and mild pain in her arm left arm, pt stated it did not last long, she was very tired Saturday and Sunday, last night her heart started racing fast again. I have recommended that patient go be seen in the ER, Patient wanted to know what provider recommends her due being that she has had these heart racing episodes. Please advise.

## 2022-09-21 NOTE — Telephone Encounter (Signed)
Patient made aware, verbalized understanding. Stated she will go get check out.

## 2022-09-22 DIAGNOSIS — E119 Type 2 diabetes mellitus without complications: Secondary | ICD-10-CM | POA: Diagnosis not present

## 2022-09-22 DIAGNOSIS — R0789 Other chest pain: Secondary | ICD-10-CM | POA: Diagnosis not present

## 2022-09-22 DIAGNOSIS — R079 Chest pain, unspecified: Secondary | ICD-10-CM | POA: Diagnosis not present

## 2022-09-22 DIAGNOSIS — I10 Essential (primary) hypertension: Secondary | ICD-10-CM | POA: Diagnosis not present

## 2022-10-05 ENCOUNTER — Ambulatory Visit: Payer: HMO | Attending: Family Medicine

## 2022-10-05 ENCOUNTER — Encounter: Payer: Self-pay | Admitting: Family Medicine

## 2022-10-05 ENCOUNTER — Ambulatory Visit (INDEPENDENT_AMBULATORY_CARE_PROVIDER_SITE_OTHER): Payer: HMO | Admitting: Family Medicine

## 2022-10-05 VITALS — BP 110/60 | HR 78 | Temp 98.1°F | Resp 14 | Ht 66.5 in | Wt 147.0 lb

## 2022-10-05 DIAGNOSIS — E1121 Type 2 diabetes mellitus with diabetic nephropathy: Secondary | ICD-10-CM | POA: Diagnosis not present

## 2022-10-05 DIAGNOSIS — E782 Mixed hyperlipidemia: Secondary | ICD-10-CM

## 2022-10-05 DIAGNOSIS — E1159 Type 2 diabetes mellitus with other circulatory complications: Secondary | ICD-10-CM

## 2022-10-05 DIAGNOSIS — R0789 Other chest pain: Secondary | ICD-10-CM | POA: Diagnosis not present

## 2022-10-05 DIAGNOSIS — I152 Hypertension secondary to endocrine disorders: Secondary | ICD-10-CM

## 2022-10-05 DIAGNOSIS — R002 Palpitations: Secondary | ICD-10-CM

## 2022-10-05 DIAGNOSIS — Z23 Encounter for immunization: Secondary | ICD-10-CM | POA: Insufficient documentation

## 2022-10-05 HISTORY — DX: Hypomagnesemia: E83.42

## 2022-10-05 NOTE — Progress Notes (Signed)
Subjective:  Patient ID: Rachel Hensley, female    DOB: 05/15/1956  Age: 66 y.o. MRN: [id number]  Chief Complaint  Patient presents with   Palpitations   Transitions Of Care    HPI Patient was seen at [facility] on 09/21/2022 and discharged on 09/22/2022. She was discharged Stable to home. They did chest x ray negative. Ecochardiogram  normal Left ventricular wall motion, and ejection 76%, no reversible ischemia or infaction. Normal EKG. Labs showed Sodium 134 L, magnesium was low They did not change any medication or add any medication. Trigs although up at admission had improved from 300 down to 221.   Patient was laid off. [facility] closed. Patient's medications are too expensive.    Current Outpatient Medications on File Prior to Visit  Medication Sig Dispense Refill   aspirin 81 MG chewable tablet Chew 81 mg by mouth daily.     atorvastatin (LIPITOR) 20 MG tablet TAKE 1 TABLET DAILY 90 tablet 1   bisoprolol-hydrochlorothiazide (ZIAC) 5-6.25 MG tablet Take 1 tablet by mouth daily. 90 tablet 1   cetirizine (ZYRTEC) 10 MG tablet Take 10 mg by mouth daily.     Dulaglutide (TRULICITY) 4.5 MG/0.5ML SOPN Inject 4.5 mg into the skin once a week. 6 mL 0   EPINEPHrine 0.3 mg/0.3 mL IJ SOAJ injection Inject 0.3 mg into the muscle once.     Finerenone (KERENDIA) 10 MG TABS Take 10 mg by mouth daily in the afternoon. 90 tablet 1   icosapent Ethyl (VASCEPA) 1 g capsule TAKE 2 CAPSULES TWICE A DAY 360 capsule 1   meloxicam (MOBIC) 15 MG tablet Take 15 mg by mouth daily.     omeprazole (PRILOSEC) 20 MG capsule TAKE 1 CAPSULE TWICE A DAY 180 capsule 1   SYNJARDY XR 12.04-999 MG TB24 TAKE 1 TABLET TWICE A DAY 180 tablet 0   ondansetron (ZOFRAN ODT) 4 MG disintegrating tablet Take 1 tablet (4 mg total) by mouth every 8 (eight) hours as needed for nausea or vomiting. (Patient not taking: Reported on 10/05/2022) 30 tablet 0   promethazine (PHENERGAN) 25 MG tablet Take 1 tablet (25 mg  total) by mouth every 8 (eight) hours as needed for nausea or vomiting. (Patient not taking: Reported on 10/05/2022) 20 tablet 0   No current facility-administered medications on file prior to visit.   Past Medical History:  Diagnosis Date   Angioedema 04/05/2018   Hypoglycemia due to type 2 diabetes mellitus (HCC) 08/21/2020   Mixed hyperlipidemia    Nontoxic multinodular goiter    Other vitamin B12 deficiency anemias    Primary insomnia 05/20/2020   Solitary pulmonary nodule    Type 2 diabetes mellitus with other specified complication [facility])    Past Surgical History:  Procedure Laterality Date   ABDOMINAL HYSTERECTOMY  06/1993   partial; still has both ovaries   HYSTEROTOMY     partial    Family History  Problem Relation Age of Onset   Coronary artery disease Mother    Glaucoma Mother    Colon cancer Father    Diabetes Sister    Atrial fibrillation Sister    Cancer Brother        renal   Kidney cancer Brother    Diabetes type II Brother    Coronary artery disease Brother    Diabetes Brother    Diabetes Brother    Coronary artery disease Brother    Asthma Son        childhood asthma  Arrhythmia Son    Atrial fibrillation Son    Parkinsonism Son    Eczema Grandchild    Atopy Grandchild    Allergic rhinitis Neg Hx    Angioedema Neg Hx    Immunodeficiency Neg Hx    Urticaria Neg Hx    Breast cancer Neg Hx    Social History   Socioeconomic History   Marital status: Widowed    Spouse name: Not on file   Number of children: 3   Years of education: Not on file   Highest education level: Not on file  Occupational History   Occupation: Sewer  Tobacco Use   Smoking status: Never   Smokeless tobacco: Never  Vaping Use   Vaping Use: Never used  Substance and Sexual Activity   Alcohol use: Never   Drug use: Never   Sexual activity: Not on file  Other Topics Concern   Not on file  Social History Narrative   Not on file   Social Determinants of Health    Financial Resource Strain: Low Risk  (05/29/2022)   Overall Financial Resource Strain (CARDIA)    Difficulty of Paying Living Expenses: Not hard at all  Food Insecurity: No Food Insecurity (05/29/2022)   Hunger Vital Sign    Worried About Running Out of Food in the Last Year: Never true    [place] in the Last Year: Never true  Transportation Needs: No Transportation Needs (05/29/2022)   PRAPARE - Hydrologist (Medical): No    Lack of Transportation (Non-Medical): No  Physical Activity: Sufficiently Active (05/29/2022)   Exercise Vital Sign    Days of Exercise per Week: 5 days    Minutes of Exercise per Session: 30 min  Stress: No Stress Concern Present (05/29/2022)   [place]    Feeling of Stress : Only a little  Social Connections: Moderately Isolated (05/29/2022)   Social Connection and Isolation Panel [NHANES]    Frequency of Communication with Friends and Family: More than three times a week    Frequency of Social Gatherings with Friends and Family: More than three times a week    Attends Religious Services: More than 4 times per year    Active Member of [employer] or Organizations: No    Attends Archivist Meetings: Never    Marital Status: Widowed    Review of Systems  Constitutional:  Negative for chills, fatigue and fever.  HENT:  Negative for congestion, ear pain and sore throat.   Respiratory:  Negative for cough and shortness of breath.   Cardiovascular:  Negative for chest pain and palpitations.  Gastrointestinal:  Positive for diarrhea. Negative for abdominal pain, constipation, nausea and vomiting.  Endocrine: Negative for polydipsia, polyphagia and polyuria.  Genitourinary:  Negative for difficulty urinating and dysuria.  Musculoskeletal:  Negative for arthralgias, back pain and myalgias.  Skin:  Negative for rash.  Neurological:  Negative for headaches.   Psychiatric/Behavioral:  Negative for dysphoric mood. The patient is not nervous/anxious.      Objective:  BP 110/60   Pulse 78   Temp 98.1 F (36.7 C)   Resp 14   Ht 5' 6.5" (1.689 m)   Wt 147 lb (66.7 kg)   LMP  (LMP Unknown)   SpO2 98%   BMI 23.37 kg/m      10/05/2022   11:15 AM 07/28/2022    7:38 AM 05/29/2022  1:12 PM  BP/Weight  Systolic BP 110 118   Diastolic BP 60 60   Wt. (Lbs) 147 152 145  BMI 23.37 kg/m2 26.5 kg/m2 24.89 kg/m2    Physical Exam Vitals reviewed.  Constitutional:      Appearance: Normal appearance. She is normal weight.  Neck:     Vascular: No carotid bruit.  Cardiovascular:     Rate and Rhythm: Normal rate and regular rhythm.     Heart sounds: Normal heart sounds.  Pulmonary:     Effort: Pulmonary effort is normal. No respiratory distress.     Breath sounds: Normal breath sounds.  Abdominal:     General: Abdomen is flat. Bowel sounds are normal.     Palpations: Abdomen is soft.     Tenderness: There is no abdominal tenderness.  Neurological:     Mental Status: She is alert and oriented to person, place, and time.  Psychiatric:        Mood and Affect: Mood normal.        Behavior: Behavior normal.     Diabetic Foot Exam - Simple   No data filed      Lab Results  Component Value Date   WBC 8.4 07/28/2022   HGB 12.4 07/28/2022   HCT 39.6 07/28/2022   PLT 366 07/28/2022   GLUCOSE 191 (H) 07/28/2022   CHOL 182 07/28/2022   TRIG 300 (H) 07/28/2022   HDL 45 07/28/2022   LDLCALC 88 07/28/2022   ALT 19 07/28/2022   AST 14 07/28/2022   NA 142 07/28/2022   K 4.8 07/28/2022   CL 102 07/28/2022   CREATININE 0.79 07/28/2022   BUN 17 07/28/2022   CO2 22 07/28/2022   TSH 1.100 04/24/2022   HGBA1C 7.7 (H) 07/28/2022   MICROALBUR 150 10/27/2021      Assessment & Plan:   Problem List Items Addressed This Visit       Cardiovascular and Mediastinum   Hypertension associated with diabetes (HCC) (Chronic)    Has been well  controlled.  Unfortunately she is unable to afford her Trulicity, Synjardy XR, [place]. Referred to chronic care management in the hopes that they are able to get her some patient assistance.      Relevant Orders   AMB Referral to Chronic Care Management Services     Endocrine   Diabetic glomerulopathy (HCC)    Diabetes well controlled on current medications.  Refer to chronic care management in order to help with patient assistance.      Relevant Orders   AMB Referral to Chronic Care Management Services   Hemoglobin A1c     Other   Mixed hyperlipidemia    Improved.  Again patient unable to afford Vascepa.  We will need to get patient assistance.      Relevant Orders   AMB Referral to Chronic Care Management Services   Lipid panel   Atypical chest pain - Primary    Ruled out cardiac ischemia during her admission.      Relevant Orders   LONG TERM MONITOR (3-14 DAYS)   Palpitations    Order Holter monitor.      Relevant Orders   TSH   Comprehensive metabolic panel   CBC with Differential/Platelet   Magnesium   LONG TERM MONITOR (3-14 DAYS)   Hypomagnesemia    Check magnesium level.      Relevant Orders   Magnesium   LONG TERM MONITOR (3-14 DAYS)   Need for immunization against  influenza   Relevant Orders   Flu Vaccine QUAD High Dose(Fluad) (Completed)  .  No orders of the defined types were placed in this encounter.   Orders Placed This Encounter  Procedures   Flu Vaccine QUAD High Dose(Fluad)   TSH   Comprehensive metabolic panel   CBC with Differential/Platelet   Magnesium   Lipid panel   Hemoglobin A1c   AMB Referral to Chronic Care Management Services   LONG TERM MONITOR (3-14 DAYS)     Follow-up: Return in about 2 months (around 12/04/2022) for chronic fasting.  An After Visit Summary was printed and given to the patient.  Blane Ohara, MD [facility] [phone]

## 2022-10-05 NOTE — Progress Notes (Unsigned)
Enrolled for [employer] to mail a ZIO XT long term holter monitor to the patients address on file.   DOD to read. 

## 2022-10-05 NOTE — Assessment & Plan Note (Signed)
Improved.  Again patient unable to afford Vascepa.  We will need to get patient assistance.

## 2022-10-05 NOTE — Assessment & Plan Note (Signed)
Diabetes well controlled on current medications.  Refer to chronic care management in order to help with patient assistance.

## 2022-10-05 NOTE — Assessment & Plan Note (Signed)
Has been well controlled.  Unfortunately she is unable to afford her Trulicity, Synjardy XR, [place]. Referred to chronic care management in the hopes that they are able to get her some patient assistance.

## 2022-10-05 NOTE — Assessment & Plan Note (Signed)
Order Holter monitor

## 2022-10-05 NOTE — Assessment & Plan Note (Signed)
Check magnesium level 

## 2022-10-05 NOTE — Assessment & Plan Note (Signed)
Ruled out cardiac ischemia during her admission.

## 2022-10-06 ENCOUNTER — Telehealth: Payer: Self-pay | Admitting: *Deleted

## 2022-10-06 LAB — COMPREHENSIVE METABOLIC PANEL
ALT: 16 IU/L (ref 0–32)
AST: 14 IU/L (ref 0–40)
Albumin/Globulin Ratio: 1.7 (ref 1.2–2.2)
Albumin: 4.6 g/dL (ref 3.9–4.9)
Alkaline Phosphatase: 71 IU/L (ref 44–121)
BUN/Creatinine Ratio: 34 — ABNORMAL HIGH (ref 12–28)
BUN: 27 mg/dL (ref 8–27)
Bilirubin Total: 0.4 mg/dL (ref 0.0–1.2)
CO2: 22 mmol/L (ref 20–29)
Calcium: 10.2 mg/dL (ref 8.7–10.3)
Chloride: 100 mmol/L (ref 96–106)
Creatinine, Ser: 0.8 mg/dL (ref 0.57–1.00)
Globulin, Total: 2.7 g/dL (ref 1.5–4.5)
Glucose: 186 mg/dL — ABNORMAL HIGH (ref 70–99)
Potassium: 5.1 mmol/L (ref 3.5–5.2)
Sodium: 140 mmol/L (ref 134–144)
Total Protein: 7.3 g/dL (ref 6.0–8.5)
eGFR: 80 mL/min/{1.73_m2} (ref >59)

## 2022-10-06 LAB — CBC WITH DIFFERENTIAL/PLATELET
Basophils Absolute: 0.1 10*3/uL (ref 0.0–0.2)
Basos: 1 %
EOS (ABSOLUTE): 0.4 10*3/uL (ref 0.0–0.4)
Eos: 4 %
Hematocrit: 38.7 % (ref 34.0–46.6)
Hemoglobin: 12.2 g/dL (ref 11.1–15.9)
Immature Grans (Abs): 0 10*3/uL (ref 0.0–0.1)
Immature Granulocytes: 0 %
Lymphocytes Absolute: 3.2 10*3/uL — ABNORMAL HIGH (ref 0.7–3.1)
Lymphs: 32 %
MCH: 25.4 pg — ABNORMAL LOW (ref 26.6–33.0)
MCHC: 31.5 g/dL (ref 31.5–35.7)
MCV: 81 fL (ref 79–97)
Monocytes Absolute: 0.7 10*3/uL (ref 0.1–0.9)
Monocytes: 7 %
Neutrophils Absolute: 5.7 10*3/uL (ref 1.4–7.0)
Neutrophils: 56 %
Platelets: 369 10*3/uL (ref 150–450)
RBC: 4.8 x10E6/uL (ref 3.77–5.28)
RDW: 16.3 % — ABNORMAL HIGH (ref 11.7–15.4)
WBC: 10 10*3/uL (ref 3.4–10.8)

## 2022-10-06 LAB — MAGNESIUM: Magnesium: 1.4 mg/dL — ABNORMAL LOW (ref 1.6–2.3)

## 2022-10-06 LAB — TSH: TSH: 0.722 u[IU]/mL (ref 0.450–4.500)

## 2022-10-06 NOTE — Chronic Care Management (AMB) (Signed)
  Care Coordination   Note   10/06/2022 Name: Rachel Hensley MRN: [id number] DOB: 08-Nov-1956  [name] is a 66 y.o. year old female who sees Cox, Kirsten, MD for primary care. I reached out to Andreas Blower by phone today to offer care coordination services.  Ms. Kitchen was given information about Care Coordination services today including:   The Care Coordination services include support from the care team which includes your Nurse Coordinator, Clinical Social Worker, or Pharmacist.  The Care Coordination team is here to help remove barriers to the health concerns and goals most important to you. Care Coordination services are voluntary, and the patient may decline or stop services at any time by request to their care team member.   Care Coordination Consent Status: Patient agreed to services and verbal consent obtained.   Follow up plan:  Telephone appointment with care coordination team member scheduled for:  10/08/2022  Encounter Outcome:  Pt. Scheduled from referral   Julian Hy, [place] Direct Dial: [phone]

## 2022-10-06 NOTE — Progress Notes (Signed)
Blood count normal.  Liver function normal.  Kidney function normal.  Magnesium low. Recommend magnesium oxide 400 mg once daily. 90/0.

## 2022-10-08 ENCOUNTER — Ambulatory Visit: Payer: Self-pay | Admitting: *Deleted

## 2022-10-08 DIAGNOSIS — E1121 Type 2 diabetes mellitus with diabetic nephropathy: Secondary | ICD-10-CM

## 2022-10-08 NOTE — Addendum Note (Signed)
Addended by: Deirdre Peer on: 10/08/2022 03:00 PM   Modules accepted: Orders

## 2022-10-08 NOTE — Patient Instructions (Signed)
Visit Information  Thank you for taking time to visit with me today. Please don't hesitate to contact me if I can be of assistance to you.   Following are the goals we discussed today:   Goals Addressed               This Visit's Progress     Reduce symptoms of depression (pt-stated)        Care Coordination Interventions: Pt acknowledges dealing with job loss/laid off, bereavement from loss of her husband of 2 years  Pt desires to remain active, healthy and to seek additional support/resources and to aide in her quality of life Pt has 3 sons nearby who are supportive and also 2 sisters close-  CSW encouraged pt to consider part-time work, volunteering, engaging with [employer], [employer], etc  Depression screen reviewed- pt scoring "mild" and with most symptoms relative to her grieving CSW encouraged pt to consider grief counseling, support and classes offered at local Hospice- emailing you info  PHQ2/ PHQ9 completed Solution-Focused Strategies employed:  Active listening / Reflection utilized  Emotional Support Provided Problem [place] strategies reviewed Provided general psycho-education for mental health needs  Pt concerned about cost of her RX since becoming unemployed- will refer to Pharmacy team to assist          Our next appointment is by telephone on 11/02/22 at 11am  Please call the care guide team at [phone] if you need to cancel or reschedule your appointment.   If you are experiencing a Mental Health or [place] or need someone to talk to, please call the Suicide and Crisis Lifeline: 988 call the [place] National Suicide Prevention Lifeline: (480)348-5385 or TTY: (947)501-0766 TTY 317-349-6086) to talk to a trained counselor call 911   Patient verbalizes understanding of instructions and care plan provided today and agrees to view in [place]. Active MyChart status and patient understanding of how to access instructions and care  plan via MyChart confirmed with patient.     Telephone follow up appointment with care management team member scheduled for: Referral to Pharmacy team for follow up with the patient and will provide direct communication to the PCP for this patient.   Eduard Clos MSW, LCSW Licensed Clinical Social Worker      [phone]

## 2022-10-08 NOTE — Patient Outreach (Signed)
  Care Coordination   Initial Visit Note   10/08/2022 Name: Rachel Hensley MRN: [id number] DOB: [birth year]  Rachel Hensley is a 66 y.o. year old female who sees Cox, Kirsten, MD for primary care. I spoke with  Andreas Blower by phone today.  What matters to the patients health and wellness today?  Depression/stress and anxiety- loss of husband(grief) and financial concerns (laid off/less income)    Goals Addressed               This Visit's Progress     Reduce symptoms of depression (pt-stated)        Care Coordination Interventions: Pt acknowledges dealing with job loss/laid off, bereavement from loss of her husband of 23 years  Pt desires to remain active, healthy and to seek additional support/resources and to aide in her quality of life Pt has 3 sons nearby who are supportive and also 2 sisters close-  CSW encouraged pt to consider part-time work, volunteering, engaging with [employer], [employer], etc  Depression screen reviewed- pt scoring "mild" and with most symptoms relative to her grieving CSW encouraged pt to consider grief counseling, support and classes offered at local Hospice- emailing you info  PHQ2/ PHQ9 completed Solution-Focused Strategies employed:  Active listening / Reflection utilized  Emotional Support Provided Problem [place] strategies reviewed Provided general psycho-education for mental health needs  Pt concerned about cost of her RX since becoming unemployed- will refer to Pharmacy team to assist          SDOH assessments and interventions completed:  Yes  SDOH Interventions Today    Flowsheet Row Most Recent Value  SDOH Interventions   Food Insecurity Interventions Ambulatory [id number] Order  [food resources via care guide]  Transportation Interventions Intervention Not Indicated  Utilities Interventions Intervention Not Indicated  Depression Interventions/Treatment  Counseling, PHQ2-9 Score <4 Follow-up Not Indicated   [Suggested pt consider Hospice grief support/counseling]  Financial Strain Interventions [id number] Referral        Care Coordination Interventions Activated:  Yes  Care Coordination Interventions:  Yes, provided   Follow up plan: Follow up call scheduled for 11/02/22    Encounter Outcome:  Pt. Visit Completed  Eduard Clos MSW, LCSW Licensed Clinical Social Worker      [phone]

## 2022-10-09 ENCOUNTER — Telehealth: Payer: Self-pay | Admitting: *Deleted

## 2022-10-09 DIAGNOSIS — R002 Palpitations: Secondary | ICD-10-CM | POA: Diagnosis not present

## 2022-10-09 DIAGNOSIS — R0789 Other chest pain: Secondary | ICD-10-CM | POA: Diagnosis not present

## 2022-10-09 NOTE — Patient Outreach (Unsigned)
  Care Coordination   Collaboration  Visit Note   10/09/2022 Name: ADJA RUFF MRN: [id number] DOB: 06-23-1956  ELLISE KOVACK is a 66 y.o. year old female who sees Cox, Elnita Maxwell, MD for primary care. I  collaborated with AD regarding appropriate Pharmacy resources-   What matters to the patients health and wellness today?  RX costs/concerns    Goals Addressed               This Visit's Progress     Reduce symptoms of depression (pt-stated)        Care Coordination Interventions: Pt concerned about cost of her RX since becoming unemployed- collaborated with AD to connect with Marine scientist.           SDOH assessments and interventions completed:  Yes{THN Tip this will not be part of the note when signed-REQUIRED REPORT FIELD DO NOT DELETE (Optional):27901}     Care Coordination Interventions Activated:  Yes {THN Tip this will not be part of the note when signed-REQUIRED REPORT FIELD DO NOT DELETE (Optional):27901} Care Coordination Interventions:  Yes, provided {THN Tip this will not be part of the note when signed-REQUIRED REPORT FIELD DO NOT DELETE (Optional):27901}  Follow up plan: Follow up call scheduled for CSW    Encounter Outcome:  Pt. Visit Completed {THN Tip this will not be part of the note when signed-REQUIRED REPORT FIELD DO NOT DELETE (Optional):27901}

## 2022-10-13 ENCOUNTER — Other Ambulatory Visit: Payer: Self-pay

## 2022-10-13 MED ORDER — OMEPRAZOLE 20 MG PO CPDR: 20.0000 mg | DELAYED_RELEASE_CAPSULE | Freq: Two times a day (BID) | ORAL | 1 refills | Status: DC

## 2022-10-19 ENCOUNTER — Telehealth: Payer: Self-pay | Admitting: Pharmacist

## 2022-10-19 NOTE — Progress Notes (Signed)
[facility])  [place] Team    10/19/2022  TENNA LACKO 12/06/1956 [id number]  Reason for referral: Medication Assistance with Terance Ice   Referral source: [facility] Social Worker Current insurance: [employer] C-SNP  Outreach:  Successful telephone call with Mrs. Rachel Hensley.  HIPAA identifiers verified.   Objective: The 10-year ASCVD risk score (Arnett DK, et al., 2019) is: 14.5%   Values used to calculate the score:     Age: 66 years     Sex: Female     Is Non-Hispanic African American: No     Diabetic: Yes     Tobacco smoker: No     Systolic Blood Pressure: 503 mmHg     Is BP treated: Yes     HDL Cholesterol: 45 mg/dL     Total Cholesterol: 182 mg/dL  Lab Results  Component Value Date   CREATININE 0.80 10/05/2022   CREATININE 0.79 07/28/2022   CREATININE 0.86 04/24/2022    Lab Results  Component Value Date   HGBA1C 7.7 (H) 07/28/2022    Lipid Panel     Component Value Date/Time   CHOL 182 07/28/2022 0813   TRIG 300 (H) 07/28/2022 0813   HDL 45 07/28/2022 0813   CHOLHDL 4.0 07/28/2022 0813   LDLCALC 88 07/28/2022 0813    BP Readings from Last 3 Encounters:  10/05/22 110/60  07/28/22 118/60  04/15/22 110/60    Allergies  Allergen Reactions   Actos [Pioglitazone] Other (See Comments)    Weight gain/ edema   Fenofibrate     diarrhea   Lisinopril Other (See Comments)    angioedema   Prednisone Swelling    Assessment: Reviewed patient assistance process and requirements with Mrs. Tackitt. [employer] will be mailing her applications for [place] and Ozempic. Informed patient that there is not an assistance program available for Vascepa, however I have signed her up for the wait list for the [place]. The PAN foundation will re-open their hypercholesterolemia fund in 2024.  Lastly I reviewed with Mrs. Hartner that [employer], Agricultural engineer of Trulicity has not allowed any  new applications for patient assistance for 2023. [place] is accepting applications and patient is agreeable to this change. I will coordinate with Dr. Tobie Poet for approval of change as well.   Medication Assistance Findings:  Medication assistance needs identified: Tacy Dura, and [place]   Extra Help:  Not eligible for Extra Help Low Income Subsidy based on reported income and assets  Patient Assistance Programs: Synjardy made by [employer] requirement met: Yes Out-of-pocket prescription expenditure met:   Not Applicable Patient has met application requirements to apply for this program.    Carrington Clamp  made by [employer] requirement met: Yes Out-of-pocket prescription expenditure met:   Not Applicable Patient has met application requirements to apply for this program.    Ozempic made by [employer] requirement met: Yes Out-of-pocket prescription expenditure met:   Not Applicable Patient has met application requirements to apply for this program.     Additional medication assistance options reviewed with patient as warranted:  No other options identified  Plan: I will route patient assistance letter to [place] technician who will coordinate patient assistance program application process for medications listed above.  [facility] pharmacy technician will assist with obtaining all required documents from both patient and provider(s) and submit application(s) once completed.  Will contact Dr. Tobie Poet regarding Change from Trulicity to  Ozempic .   Loretha Brasil, PharmD [place] Pharmacist Office: [phone]

## 2022-10-21 ENCOUNTER — Ambulatory Visit (INDEPENDENT_AMBULATORY_CARE_PROVIDER_SITE_OTHER): Payer: HMO

## 2022-10-21 DIAGNOSIS — F4321 Adjustment disorder with depressed mood: Secondary | ICD-10-CM

## 2022-10-21 DIAGNOSIS — E1121 Type 2 diabetes mellitus with diabetic nephropathy: Secondary | ICD-10-CM

## 2022-10-21 DIAGNOSIS — E1159 Type 2 diabetes mellitus with other circulatory complications: Secondary | ICD-10-CM

## 2022-10-21 DIAGNOSIS — F332 Major depressive disorder, recurrent severe without psychotic features: Secondary | ICD-10-CM

## 2022-10-21 NOTE — Chronic Care Management (AMB) (Addendum)
Chronic Care Management Provider Comprehensive Care Plan    Name: Rachel Hensley MRN: [id number] DOB: [birth year]  Referral to Chronic Care Management (CCM) services was placed by Provider:  Dr. Blane Ohara on Date: 10-05-2022.  Interaction and coordination with outside resources, practitioners, and providers See CCM Referral  Chronic Condition 1: HTN Provider Assessment and Plan  Hypertension associated with diabetes (HCC) (Chronic)       Has been well controlled.  Unfortunately she is unable to afford her Trulicity, Synjardy XR, [place]. Referred to chronic care management in the hopes that they are able to get her some patient assistance.        Relevant Orders    AMB Referral to Chronic Care Management Services         Expected Outcome/Goals Addressed This Visit: (Provider CCM Goals)/Provider Assessment and Plan  CCM Expected Outcome:  Monitor, Self-Manage, and Reduce Symptoms of Hypertension  Chronic Condition 2: DM Provider Assessment and Plan  Diabetes well controlled on current medications.  Refer to chronic care management in order to help with patient assistance.         Relevant Orders    AMB Referral to Chronic Care Management Services    Hemoglobin A1c         Expected Outcome/Goals Addressed This Visit: (Provider CCM Goals)/Provider Assessment and Plan   CCM Expected Outcome:  Monitor, Self-Manage and Reduce Symptoms of Diabetes  Chronic Condition 3: Depression and Grief  Mood and Affect: Mood normal.        Behavior: Behavior normal.   Expected Outcome/Goals Addressed This Visit: (Provider CCM Goals)/Provider Assessment and Plan   CCM (DIABETES) EXPECTED OUTCOME: MONITOR, SELF-MANAGE AND REDUCE SYMPTOMS OF Depression and Grief   Problem List Patient Active Problem List   Diagnosis Date Noted   Atypical chest pain 10/05/2022   Palpitations 10/05/2022   Hypomagnesemia 10/05/2022   Need for immunization against influenza 10/05/2022    ACEI/ARB contraindicated 01/21/2022   Impingement syndrome of right shoulder 10/19/2021   Nodule of skin of right lower leg 10/19/2021   Primary osteoarthritis of left hip 05/20/2020   Lumbar back pain 05/07/2020   Diabetic glomerulopathy (HCC) 02/19/2020   Mixed hyperlipidemia 02/19/2020   Hypertension associated with diabetes (HCC) 02/19/2020   GERD without esophagitis 02/19/2020    Medication Management Outpatient Encounter Medications as of 10/21/2022  Medication Sig   aspirin 81 MG chewable tablet Chew 81 mg by mouth daily.   atorvastatin (LIPITOR) 20 MG tablet TAKE 1 TABLET DAILY   bisoprolol-hydrochlorothiazide (ZIAC) 5-6.25 MG tablet Take 1 tablet by mouth daily.   cetirizine (ZYRTEC) 10 MG tablet Take 10 mg by mouth daily.   Dulaglutide (TRULICITY) 4.5 MG/0.5ML SOPN Inject 4.5 mg into the skin once a week.   EPINEPHrine 0.3 mg/0.3 mL IJ SOAJ injection Inject 0.3 mg into the muscle once.   Finerenone (KERENDIA) 10 MG TABS Take 10 mg by mouth daily in the afternoon.   icosapent Ethyl (VASCEPA) 1 g capsule TAKE 2 CAPSULES TWICE A DAY   meloxicam (MOBIC) 15 MG tablet Take 15 mg by mouth daily.   omeprazole (PRILOSEC) 20 MG capsule Take 1 capsule (20 mg total) by mouth 2 (two) times daily.   ondansetron (ZOFRAN ODT) 4 MG disintegrating tablet Take 1 tablet (4 mg total) by mouth every 8 (eight) hours as needed for nausea or vomiting. (Patient not taking: Reported on 10/05/2022)   promethazine (PHENERGAN) 25 MG tablet Take 1 tablet (25 mg total) by mouth  every 8 (eight) hours as needed for nausea or vomiting. (Patient not taking: Reported on 10/05/2022)   SYNJARDY XR 12.04-999 MG TB24 TAKE 1 TABLET TWICE A DAY   No facility-administered encounter medications on file as of 10/21/2022.    Cognitive Assessment Identity Confirmed: [x]  Name; [x]  DOB Cognitive Status: [x]  Normal []  Abnormal  Functional Status Survey: Is the patient deaf or have difficulty hearing?: No Does the  patient have difficulty seeing, even when wearing glasses/contacts?: No Does the patient have difficulty concentrating, remembering, or making decisions?: No Does the patient have difficulty walking or climbing stairs?: No Does the patient have difficulty dressing or bathing?: No Does the patient have difficulty doing errands alone such as visiting a doctor's office or shopping?: No  Caregiver Assessment  Current Living Arrangement: home Living arrangements - the patient lives alone. Caregiver: self Caregiver Relation:  self Status: Stable, available

## 2022-10-21 NOTE — Patient Instructions (Signed)
Please call the care guide team at [phone] if you need to cancel or reschedule your appointment.   If you are experiencing a Mental Health or [place] or need someone to talk to, please call the Suicide and Crisis Lifeline: 988 call the [place] National Suicide Prevention Lifeline: (631) 402-7735 or TTY: 6415276876 TTY 559-221-2678) to talk to a trained counselor call 1-800-273-TALK (toll free, 24 hour hotline)   Following is a copy of your full provider care plan:   Goals Addressed             This Visit's Progress    CCM (DIABETES) EXPECTED OUTCOME: MONITOR, SELF-MANAGE AND REDUCE SYMPTOMS OF Depression and Grief       Current Barriers:  Care Coordination needs related to [place] support and resources in a patient with depression and grief Chronic Disease Management support and education needs related to effective management of depression and grief  Planned Interventions: Evaluation of current treatment plan related to depression and grief and patient's adherence to plan as established by provider Advised patient to call the office for changes in mood, anxiety, depression, or acute changes in mental health Provided education to patient re: about the [place] and the availability of grief support due to the loss of her husband, mom, brother, and other family members over the last few years Reviewed scheduled/upcoming provider appointments including 12-04-2022 at 730 am Discussed plans with patient for ongoing care management follow up and provided patient with direct contact information for care management team Advised patient to discuss changes in her mental health, depression and grief with provider Screening for signs and symptoms of depression related to chronic disease state  Assessed social determinant of health barriers  Symptom Management: Attend all scheduled provider appointments Attend church or other social activities Work with the social worker  to address care coordination needs and will continue to work with the clinical team to address health care and disease management related needs call the Suicide and Crisis Lifeline: 988 call the [place] National Suicide Prevention Lifeline: (315)089-9250 or TTY: 279 120 1905 TTY [phone]) to talk to a trained counselor call 1-800-273-TALK (toll free, 24 hour hotline) if experiencing a Mental Health or [place]   Follow Up Plan: Telephone follow up appointment with care management team member scheduled for: 12-16-2022 at 0900 am       CCM Expected Outcome:  Monitor, Self-Manage and Reduce Symptoms of Diabetes       Current Barriers:  Care Coordination needs related to loss of job and inability to pay for medications in a patient with DM Chronic Disease Management support and education needs related to effective management of DM Financial Constraints.  Difficulty obtaining medications  Planned Interventions: Provided education to patient about basic DM disease process; Reviewed medications with patient and discussed importance of medication adherence;        Reviewed prescribed diet with patient heart healthy/ADA diet; Counseled on importance of regular laboratory monitoring as prescribed;        Discussed plans with patient for ongoing care management follow up and provided patient with direct contact information for care management team;      Provided patient with written educational materials related to hypo and hyperglycemia and importance of correct treatment;       Reviewed scheduled/upcoming provider appointments including: 12-04-2022 at 730 am;         Advised patient, providing education and rationale, to check cbg when you have symptoms of low or high blood sugar and as  directed   and record        call provider for findings outside established parameters;       Referral made to pharmacy team for assistance with financial difficulties of being able to afford  medications for effective management of DM;       Referral made to social work team for assistance with financial barriers, grief and depression;      Review of patient status, including review of consultants reports, relevant laboratory and other test results, and medications completed;       Advised patient to discuss changes in her DM health and well being with provider;      Screening for signs and symptoms of depression related to chronic disease state;        Assessed social determinant of health barriers;       Patient had an appointment in September to get her eyes checked but missed the appointment because she went the wrong day. Is planning on rescheduling    Symptom Management: Take medications as prescribed   Attend all scheduled provider appointments Call pharmacy for medication refills 3-7 days in advance of running out of medications Call provider office for new concerns or questions  Work with the social worker to address care coordination needs and will continue to work with the clinical team to address health care and disease management related needs call the Suicide and Crisis Lifeline: 988 call the [place] National Suicide Prevention Lifeline: (548)210-7186 or TTY: (212)631-5049 TTY 564 530 2972) to talk to a trained counselor call 1-800-273-TALK (toll free, 24 hour hotline) if experiencing a Mental Health or Behavioral Health Crisis  schedule appointment with eye doctor check feet daily for cuts, sores or redness trim toenails straight across fill half of plate with vegetables manage portion size prepare main meal at home 3 to 5 days each week wash and dry feet carefully every day wear comfortable, cotton socks wear comfortable, well-fitting shoes  Follow Up Plan: Telephone follow up appointment with care management team member scheduled for: 12-16-2022 at 0900 am       CCM Expected Outcome:  Monitor, Self-Manage, and Reduce Symptoms of Hypertension       Current  Barriers:  Chronic Disease Management support and education needs related to effective management of DM Financial Constraints.   Planned Interventions: Evaluation of current treatment plan related to hypertension self management and patient's adherence to plan as established by provider;   Provided education to patient re: stroke prevention, s/s of heart attack and stroke; Reviewed prescribed diet heart healthy/ADA diet Reviewed medications with patient and discussed importance of compliance;  Discussed plans with patient for ongoing care management follow up and provided patient with direct contact information for care management team; Advised patient, providing education and rationale, to monitor blood pressure daily and record, calling PCP for findings outside established parameters;  Advised patient to discuss changes in blood pressure or other heart concerns  with provider; Provided education on prescribed diet heart healthy/ADA diet ;  Discussed complications of poorly controlled blood pressure such as heart disease, stroke, circulatory complications, vision complications, kidney impairment, sexual dysfunction;   Symptom Management: Take medications as prescribed   Attend all scheduled provider appointments Call pharmacy for medication refills 3-7 days in advance of running out of medications Call provider office for new concerns or questions  call the Suicide and Crisis Lifeline: 988 call the [place] National Suicide Prevention Lifeline: (682) 817-5924 or TTY: (657) 126-8013 TTY (762) 784-1177) to talk to a trained counselor call  1-800-273-TALK (toll free, 24 hour hotline) if experiencing a Mental Health or Behavioral Health Crisis  check blood pressure weekly learn about high blood pressure call doctor for signs and symptoms of high blood pressure develop an action plan for high blood pressure keep all doctor appointments take medications for blood pressure exactly as prescribed report  new symptoms to your doctor  Follow Up Plan: Telephone follow up appointment with care management team member scheduled for: 10-21-2022 at 0900 am          Patient verbalizes understanding of instructions and care plan provided today and agrees to view in MyChart. Active MyChart status and patient understanding of how to access instructions and care plan via MyChart confirmed with patient.     Telephone follow up appointment with care management team member scheduled for: 12-04-2022 at 0900 am  Grief resource: [email or web address]   Managing Loss, Adult People experience loss in many different ways throughout their lives. Events such as moving, changing jobs, and losing friends can create a sense of loss. The loss may be as serious as a major health change, divorce, death of a pet, or death of a loved one. All of these types of loss are likely to create a physical and emotional reaction known as grief. Grief is the result of a major change or an absence of something or someone that you count on. Grief is a normal reaction to loss. A variety of factors can affect your grieving experience, including: The nature of your loss. Your relationship to what or whom you lost. Your understanding of grief and how to manage it. Your support system. Be aware that when grief becomes extreme, it can lead to more severe issues like isolation, depression, anxiety, or suicidal thoughts. Talk with your health care provider if you have any of these issues. How to manage lifestyle changes Keep to your normal routine as much as possible. If you have trouble focusing or doing normal activities, it is acceptable to take some time away from your normal routine. Spend time with friends and loved ones. Eat a healthy diet, get plenty of sleep, and rest when you feel tired. How to recognize changes  The way that you deal with your grief will affect your ability to function as you normally do. When grieving, you may  experience these changes: Numbness, shock, sadness, anxiety, anger, denial, and guilt. Thoughts about death. Unexpected crying. A physical sensation of emptiness in your stomach. Problems sleeping and eating. Tiredness (fatigue). Loss of interest in normal activities. Dreaming about or imagining seeing the person who died. A need to remember what or whom you lost. Difficulty thinking about anything other than your loss for a period of time. Relief. If you have been expecting the loss for a while, you may feel a sense of relief when it happens. Follow these instructions at home: Activity Express your feelings in healthy ways, such as: Talking with others about your loss. It may be helpful to find others who have had a similar loss, such as a support group. Writing down your feelings in a journal. Doing physical activities to release stress and emotional energy. Doing creative activities like painting, sculpting, or playing or listening to music. Practicing resilience. This is the ability to recover and adjust after facing challenges. Reading some resources that encourage resilience may help you to learn ways to practice those behaviors.  General instructions Be patient with yourself and others. Allow the grieving process to happen, and remember that grieving takes  time. It is likely that you may never feel completely done with some grief. You may find a way to move on while still cherishing memories and feelings about your loss. Accepting your loss is a process. It can take months or longer to adjust. Keep all follow-up visits. This is important. Where to find support To get support for managing loss: Ask your health care provider for help and recommendations, such as grief counseling or therapy. Think about joining a support group for people who are managing a loss. Where to find more information You can find more information about managing loss from: American Society of Clinical  Oncology: www.cancer.net American Psychological Association: [employer] Contact a health care provider if: Your grief is extreme and keeps getting worse. You have ongoing grief that does not improve. Your body shows symptoms of grief, such as illness. You feel depressed, anxious, or hopeless. Get help right away if: You have thoughts about hurting yourself or others. Get help right away if you feel like you may hurt yourself or others, or have thoughts about taking your own life. Go to your nearest emergency room or: Call 911. Call the National Suicide Prevention Lifeline at (931)380-9521 or 988. This is open 24 hours a day. Text the Crisis Text Line at (605)520-7526. Summary Grief is the result of a major change or an absence of someone or something that you count on. Grief is a normal reaction to loss. The depth of grief and the period of recovery depend on the type of loss and your ability to adjust to the change and process your feelings. Processing grief requires patience and a willingness to accept your feelings and talk about your loss with people who are supportive. It is important to find resources that work for you and to realize that people experience grief differently. There is not one grieving process that works for everyone in the same way. Be aware that when grief becomes extreme, it can lead to more severe issues like isolation, depression, anxiety, or suicidal thoughts. Talk with your health care provider if you have any of these issues. This information is not intended to replace advice given to you by your health care provider. Make sure you discuss any questions you have with your health care provider. Document Revised: 08/04/2021 Document Reviewed: 08/04/2021 Elsevier Patient Education  2023 [employer].

## 2022-10-21 NOTE — Chronic Care Management (AMB) (Signed)
CCM RN Visit Note   10-21-2022 Name: Rachel Hensley MRN: [id number]      DOB: [birth year]  Subjective: Rachel Hensley is a 66 y.o. year old female who is a primary care patient of Cox, Kirsten, MD. The patient was referred to the Chronic Care Management team for assistance with care management needs subsequent to provider initiation of CCM services and plan of care.      Today's Visit: Engaged with patient by telephone for initial visit.   SDOH Interventions Today    Flowsheet Row Most Recent Value  SDOH Interventions   Social Connections Interventions Other (Comment)  [has good support system with siblings and her sons and grandchildren]        Goals Addressed             This Visit's Progress    CCM (DIABETES) EXPECTED OUTCOME: MONITOR, SELF-MANAGE AND REDUCE SYMPTOMS OF Depression and Grief       Current Barriers:  Care Coordination needs related to [place] support and resources in a patient with depression and grief Chronic Disease Management support and education needs related to effective management of depression and grief  Planned Interventions: Evaluation of current treatment plan related to depression and grief and patient's adherence to plan as established by provider Advised patient to call the office for changes in mood, anxiety, depression, or acute changes in mental health Provided education to patient re: about the [place] and the availability of grief support due to the loss of her husband, mom, brother, and other family members over the last few years Reviewed scheduled/upcoming provider appointments including 12-04-2022 at 42 am Discussed plans with patient for ongoing care management follow up and provided patient with direct contact information for care management team Advised patient to discuss changes in her mental health, depression and grief with provider Screening for signs and symptoms of depression related to chronic disease state   Assessed social determinant of health barriers  Symptom Management: Attend all scheduled provider appointments Attend church or other social activities Work with the social worker to address care coordination needs and will continue to work with the clinical team to address health care and disease management related needs call the Suicide and Crisis Lifeline: 988 call the [place] National Suicide Prevention Lifeline: 7030122851 or TTY: 907-641-0962 TTY 209 163 0634) to talk to a trained counselor call 1-800-273-TALK (toll free, 24 hour hotline) if experiencing a Mental Health or Behavioral Health Crisis   Follow Up Plan: Telephone follow up appointment with care management team member scheduled for: 12-16-2022 at 0900 am       CCM Expected Outcome:  Monitor, Self-Manage and Reduce Symptoms of Diabetes       Current Barriers:  Care Coordination needs related to loss of job and inability to pay for medications in a patient with DM Chronic Disease Management support and education needs related to effective management of DM Financial Constraints.  Difficulty obtaining medications  Planned Interventions: Provided education to patient about basic DM disease process; Reviewed medications with patient and discussed importance of medication adherence;        Reviewed prescribed diet with patient heart healthy/ADA diet; Counseled on importance of regular laboratory monitoring as prescribed;        Discussed plans with patient for ongoing care management follow up and provided patient with direct contact information for care management team;      Provided patient with written educational materials related to hypo and hyperglycemia and importance of correct treatment;  Reviewed scheduled/upcoming provider appointments including: 12-04-2022 at 730 am;         Advised patient, providing education and rationale, to check cbg when you have symptoms of low or high blood sugar and as directed   and  record        call provider for findings outside established parameters;       Referral made to pharmacy team for assistance with financial difficulties of being able to afford medications for effective management of DM;       Referral made to social work team for assistance with financial barriers, grief and depression;      Review of patient status, including review of consultants reports, relevant laboratory and other test results, and medications completed;       Advised patient to discuss changes in her DM health and well being with provider;      Screening for signs and symptoms of depression related to chronic disease state;        Assessed social determinant of health barriers;       Patient had an appointment in September to get her eyes checked but missed the appointment because she went the wrong day. Is planning on rescheduling    Symptom Management: Take medications as prescribed   Attend all scheduled provider appointments Call pharmacy for medication refills 3-7 days in advance of running out of medications Call provider office for new concerns or questions  Work with the social worker to address care coordination needs and will continue to work with the clinical team to address health care and disease management related needs call the Suicide and Crisis Lifeline: 988 call the [place] National Suicide Prevention Lifeline: 904-124-0766 or TTY: 951-615-5697 TTY 409-588-2171) to talk to a trained counselor call 1-800-273-TALK (toll free, 24 hour hotline) if experiencing a Mental Health or Behavioral Health Crisis  schedule appointment with eye doctor check feet daily for cuts, sores or redness trim toenails straight across fill half of plate with vegetables manage portion size prepare main meal at home 3 to 5 days each week wash and dry feet carefully every day wear comfortable, cotton socks wear comfortable, well-fitting shoes  Follow Up Plan: Telephone follow up appointment  with care management team member scheduled for: 12-16-2022 at 0900 am       CCM Expected Outcome:  Monitor, Self-Manage, and Reduce Symptoms of Hypertension       Current Barriers:  Chronic Disease Management support and education needs related to effective management of DM Financial Constraints.   Planned Interventions: Evaluation of current treatment plan related to hypertension self management and patient's adherence to plan as established by provider;   Provided education to patient re: stroke prevention, s/s of heart attack and stroke; Reviewed prescribed diet heart healthy/ADA diet Reviewed medications with patient and discussed importance of compliance;  Discussed plans with patient for ongoing care management follow up and provided patient with direct contact information for care management team; Advised patient, providing education and rationale, to monitor blood pressure daily and record, calling PCP for findings outside established parameters;  Advised patient to discuss changes in blood pressure or other heart concerns  with provider; Provided education on prescribed diet heart healthy/ADA diet ;  Discussed complications of poorly controlled blood pressure such as heart disease, stroke, circulatory complications, vision complications, kidney impairment, sexual dysfunction;   Symptom Management: Take medications as prescribed   Attend all scheduled provider appointments Call pharmacy for medication refills 3-7 days in advance of running out  of medications Call provider office for new concerns or questions  call the Suicide and Crisis Lifeline: 988 call the [place] National Suicide Prevention Lifeline: (406) 883-5641 or TTY: 720-736-7216 TTY 7375172635) to talk to a trained counselor call 1-800-273-TALK (toll free, 24 hour hotline) if experiencing a Mental Health or Behavioral Health Crisis  check blood pressure weekly learn about high blood pressure call doctor for signs and  symptoms of high blood pressure develop an action plan for high blood pressure keep all doctor appointments take medications for blood pressure exactly as prescribed report new symptoms to your doctor  Follow Up Plan: Telephone follow up appointment with care management team member scheduled for: 10-21-2022 at 0900 am          Plan:Telephone follow up appointment with care management team member scheduled for:  12-04-2022 at 0900 am  Alto Denver RN, MSN, CCM RN Care Manager  Chronic Care Management Direct Number: [phone]

## 2022-10-22 ENCOUNTER — Telehealth: Payer: Self-pay | Admitting: Pharmacist

## 2022-10-22 NOTE — Progress Notes (Signed)
[facility])                                            [place] Team    10/22/2022  Rachel Hensley 1956/04/24 [id number]  Placed telephonic outreach to [name] to notify her I have received a notification that the [place] [employer]'s Hypercholesterolemia - Medicare Access has opened today. Placed a three way call to [employer] with [name] on the line.   Approved for $2500 for 09/22/2022-09/22/2023 [facility] to re-enroll 30 days prior to the end of the grant 08/21/2022.   [place]:  ID Number: [id number]  Group: [id number] BIN: [id number] PCN: [id number]   [name] will need a new prescription for Vascepa 1 g take 2 capsules twice a day #90 with 3 refills issued to [place] at [facility].  Confirmed this with [name]. Will follow up with Dr. Alyse Low nurse.    Loretha Brasil, PharmD [place] Pharmacist Office: [phone]

## 2022-10-23 ENCOUNTER — Telehealth: Payer: Self-pay | Admitting: Pharmacist

## 2022-10-23 ENCOUNTER — Other Ambulatory Visit: Payer: Self-pay

## 2022-10-23 ENCOUNTER — Other Ambulatory Visit (HOSPITAL_COMMUNITY): Payer: Self-pay

## 2022-10-23 MED ORDER — ICOSAPENT ETHYL 1 G PO CAPS
2.0000 g | ORAL_CAPSULE | Freq: Two times a day (BID) | ORAL | 1 refills | Status: DC
  Filled 2022-10-23: qty 360, 90d supply, fill #0
  Filled 2023-02-02: qty 360, 90d supply, fill #1

## 2022-10-23 NOTE — Progress Notes (Signed)
[facility])                                            [place] Team    10/23/2022  ARYANNE GILLELAND [birth year] [id number]  Placed telephonic outreach to [place] and spoke with Hoyle Sauer, Dr. Alyse Low RN. Requested a new prescription for Vascepa 1 g take 2 capsules twice a day #90 with 3 refills issued to [place] at [facility]. This is a contracted mail order pharmacy with [facility].   Made Hoyle Sauer aware that the Ozempic 0.5 mg will be filled through patient assistance for a 4 month supply, Dr. Tobie Poet will not be able to titrate the medication up unless a new prescription is issued to [employer].   Loretha Brasil, PharmD [place] Pharmacist Office: [phone]

## 2022-10-26 ENCOUNTER — Telehealth: Payer: Self-pay | Admitting: Pharmacist

## 2022-10-26 ENCOUNTER — Other Ambulatory Visit (HOSPITAL_COMMUNITY): Payer: Self-pay

## 2022-10-26 NOTE — Progress Notes (Signed)
[facility])                                            [place] Team    10/26/2022  KEYRY IRACHETA [birth year] [id number]  Placed telephonic follow up call to [facility], provided [facility] card information on behalf of patient. Confirmed co-pay for a 90 day supply of Vascepa is $0. Confirmed mailing address for Mrs. Harnack on file.   Placed call to Mrs. Picco and informed her of the above. Informed her of refill process and timeline to re-order medication. Patient was appreciative of my call. Patient has approximately 2 days of Kerendia on hand, she states she will contact Dr. Alyse Low office to inquire about samples. Informed patient to monitor mail for partially completed applications for patient assistance from [employer]. Patient verbalized understanding and was appreciative of my call.   Loretha Brasil, PharmD [place] Pharmacist Office: [phone]

## 2022-10-27 ENCOUNTER — Other Ambulatory Visit: Payer: Self-pay

## 2022-10-27 ENCOUNTER — Telehealth: Payer: Self-pay | Admitting: Pharmacy Technician

## 2022-10-27 DIAGNOSIS — I10 Essential (primary) hypertension: Secondary | ICD-10-CM

## 2022-10-27 DIAGNOSIS — E1159 Type 2 diabetes mellitus with other circulatory complications: Secondary | ICD-10-CM | POA: Diagnosis not present

## 2022-10-27 DIAGNOSIS — Z596 Low income: Secondary | ICD-10-CM

## 2022-10-27 MED ORDER — OZEMPIC (0.25 OR 0.5 MG/DOSE) 2 MG/3ML ~~LOC~~ SOPN: 0.5000 mg | PEN_INJECTOR | SUBCUTANEOUS | 3 refills | Status: DC

## 2022-10-27 NOTE — Progress Notes (Signed)
Per Dr Cox: Discontinue Trulicity, Start Ozempic 0.5 mg once weekly -- [employer] patient assistance application faxed back to [facility]

## 2022-10-27 NOTE — Progress Notes (Signed)
[facility])                                            [place] Team    10/27/2022  Rachel Hensley 01-11-1956 [id number]                                      Medication Assistance Referral  Referral From: [place]  Medication/Company: Larna Daughters / [employer] Patient application portion:  Education officer, museum portion: Faxed  to Dr. Rochel Brome Provider address/fax verified via: Office website  Medication/Company: Iona Hansen Patient application portion:  Mailed Provider application portion: Faxed  to Dr. Rochel Brome Provider address/fax verified via: Office website  Medication/Company: Iva Boop / [employer] Patient application portion:  Mailed Provider application portion: Faxed  to Dr. Rochel Brome Provider address/fax verified via: Office website   [name] P. [name], [place]  [phone]

## 2022-11-02 ENCOUNTER — Ambulatory Visit: Payer: Self-pay | Admitting: *Deleted

## 2022-11-02 DIAGNOSIS — R002 Palpitations: Secondary | ICD-10-CM | POA: Diagnosis not present

## 2022-11-02 DIAGNOSIS — R0789 Other chest pain: Secondary | ICD-10-CM | POA: Diagnosis not present

## 2022-11-02 NOTE — Patient Outreach (Signed)
  Care Coordination   Follow Up Visit Note   11/02/2022 Name: NAHLA LUKIN MRN: [id number] DOB: 1956/11/04  ADALIDA GARVER is a 66 y.o. year old female who sees Cox, Kirsten, MD for primary care. I spoke with  Andreas Blower by phone today.  What matters to the patients health and wellness today?  Declines need for further support from CSW at this time.    Goals Addressed               This Visit's Progress     COMPLETED: Reduce symptoms of depression (pt-stated)        Care Coordination Interventions:  Pt reports "getting through the month of October (anniversary of husband's death) and feeling better". She has been busy attending a lot of grandkids sporting events and has not looked into social activities yet. Discussed upcoming holidays and reminded her of the Bereavement support at local Hospice agency.  Pt denies need for future outreach from [place]- will reach out if needs do arise.             SDOH assessments and interventions completed:  Yes     Care Coordination Interventions Activated:  Yes  Care Coordination Interventions:  Yes, provided   Follow up plan: No further intervention required.   Encounter Outcome:  Pt. Visit Completed  Eduard Clos MSW, LCSW Licensed Clinical Social Worker      [phone]

## 2022-11-02 NOTE — Patient Instructions (Signed)
Visit Information  Thank you for taking time to visit with me today. Please don't hesitate to contact me if I can be of assistance to you.   Following are the goals we discussed today:   Goals Addressed               This Visit's Progress     COMPLETED: Reduce symptoms of depression (pt-stated)        Care Coordination Interventions:  Pt reports "getting through the month of October (anniversary of husband's death) and feeling better". She has been busy attending a lot of grandkids sporting events and has not looked into social activities yet. Discussed upcoming holidays and reminded her of the Bereavement support at local Hospice agency.  Pt denies need for future outreach from [place]- will reach out if needs do arise.             Please call the care guide team at [phone] if you need to schedule your appointment.      The patient verbalized understanding of instructions, educational materials, and care plan provided today and DECLINED offer to receive copy of patient instructions, educational materials, and care plan.   No further follow up required:    Eduard Clos MSW, LCSW Licensed Clinical Social Worker      [phone]

## 2022-11-10 DIAGNOSIS — E1169 Type 2 diabetes mellitus with other specified complication: Secondary | ICD-10-CM | POA: Diagnosis not present

## 2022-11-10 DIAGNOSIS — G8929 Other chronic pain: Secondary | ICD-10-CM | POA: Diagnosis not present

## 2022-11-10 DIAGNOSIS — E1165 Type 2 diabetes mellitus with hyperglycemia: Secondary | ICD-10-CM | POA: Diagnosis not present

## 2022-11-10 DIAGNOSIS — M1990 Unspecified osteoarthritis, unspecified site: Secondary | ICD-10-CM | POA: Diagnosis not present

## 2022-11-10 DIAGNOSIS — E663 Overweight: Secondary | ICD-10-CM | POA: Diagnosis not present

## 2022-11-10 DIAGNOSIS — M8580 Other specified disorders of bone density and structure, unspecified site: Secondary | ICD-10-CM | POA: Diagnosis not present

## 2022-11-10 DIAGNOSIS — K589 Irritable bowel syndrome without diarrhea: Secondary | ICD-10-CM | POA: Diagnosis not present

## 2022-11-10 DIAGNOSIS — Z7982 Long term (current) use of aspirin: Secondary | ICD-10-CM | POA: Diagnosis not present

## 2022-11-10 DIAGNOSIS — J309 Allergic rhinitis, unspecified: Secondary | ICD-10-CM | POA: Diagnosis not present

## 2022-11-10 DIAGNOSIS — I10 Essential (primary) hypertension: Secondary | ICD-10-CM | POA: Diagnosis not present

## 2022-11-10 DIAGNOSIS — K219 Gastro-esophageal reflux disease without esophagitis: Secondary | ICD-10-CM | POA: Diagnosis not present

## 2022-11-10 DIAGNOSIS — E785 Hyperlipidemia, unspecified: Secondary | ICD-10-CM | POA: Diagnosis not present

## 2022-11-11 ENCOUNTER — Telehealth: Payer: Self-pay | Admitting: Pharmacist

## 2022-11-11 ENCOUNTER — Telehealth: Payer: Self-pay | Admitting: Pharmacy Technician

## 2022-11-11 DIAGNOSIS — Z596 Low income: Secondary | ICD-10-CM

## 2022-11-11 NOTE — Progress Notes (Signed)
[facility])                                            [facility] Quality Pharmacy Team    11/11/2022  Rachel Hensley 07/20/1956 [id number]  Placed telephonic outreach to [facility] to follow up on patient assistance application for [place]. Will have application for provider's signature re-faxed to ATTN: Lauren.   Reynold Bowen, PharmD [facility]  [facility] Clinical Pharmacist Office: [phone]

## 2022-11-11 NOTE — Progress Notes (Signed)
[facility])                                            [facility] Quality Pharmacy Team    11/11/2022  PETE MERTEN Jan 22, 1956 [id number]  Received both patient and provider portion(s) of patient assistance application(s) for Ozempic and Synjardy. Faxed completed application and required documents into [employer] and [employer] respectively.  NOTE: Also trying to apply for [place] with [employer]. Re faxed to Dr. Sedalia Muta for provider portion of application to be completed.  Ladiamond Gallina P. Hilliard Borges, CPhT [facility] [employer]  [phone]

## 2022-11-16 ENCOUNTER — Telehealth: Payer: Self-pay | Admitting: Pharmacy Technician

## 2022-11-16 DIAGNOSIS — Z596 Low income: Secondary | ICD-10-CM

## 2022-11-16 NOTE — Progress Notes (Signed)
[facility] Customer service manager [facility])                                            [facility] Quality Pharmacy Team    11/16/2022  Rachel Hensley 1956-05-14 [id number]  Care coordination call placed to [employer] in regard to [facility] application. Spoke to [place] who informs patient is over income for the program. Referred case back to [facility] PharmD.  Care coordination call placed to [employer] in regard to Ozempic application. Spoke to [place] who was unable to locate the successful fax in the que and requested the application be refaxed. Application refaxed today.  [name] [name], CPhT [facility] [employer]  [phone]

## 2022-11-23 ENCOUNTER — Telehealth: Payer: Self-pay | Admitting: Pharmacist

## 2022-11-23 NOTE — Progress Notes (Signed)
[facility] Customer service manager [facility])                                            [facility] Quality Pharmacy Team    11/23/2022  Rachel Hensley Dec 14, 1956 [id number]  Placed telephonic outreach to Mrs. Rachel Hensley, patient is aware that her application for [place] has been denied due to household income above the threshold for the program. [facility] will assist patient with re-application in 2024 when she has updated proof of income to verify she meets income requirements.   [employer] is still waiting for a determination for Ozempic and [place].   Reynold Bowen, PharmD [facility]  [facility] Clinical Pharmacist Office: [phone]

## 2022-11-27 ENCOUNTER — Telehealth: Payer: Self-pay

## 2022-11-27 ENCOUNTER — Other Ambulatory Visit: Payer: Self-pay

## 2022-11-27 ENCOUNTER — Telehealth: Payer: Self-pay | Admitting: Pharmacy Technician

## 2022-11-27 DIAGNOSIS — Z596 Low income: Secondary | ICD-10-CM

## 2022-11-27 MED ORDER — METFORMIN HCL 1000 MG PO TABS: 1000.0000 mg | ORAL_TABLET | Freq: Two times a day (BID) | ORAL | 0 refills | Status: DC

## 2022-11-27 NOTE — Telephone Encounter (Signed)
Patient called and stated she can not afford synjardy and was denied the help from Patient Assistant. Tiffany that is helping her get her medication told her to call PCP and she if she can get metformin or go back on glimepiride until she can get synjardy. She hasn't heard nothing back from [place] or [place] either.  Patient also stated that she had some glimepiride left that she has started taking twice a day.  Please advise

## 2022-11-27 NOTE — Telephone Encounter (Signed)
Patient made ware, verbalized understanding. Rx sent

## 2022-11-27 NOTE — Progress Notes (Signed)
[facility])                                            [facility] Quality Pharmacy Team    11/27/2022  Rachel Hensley [birth year] [id number]  Received both patient and provider portion(s) of patient assistance application(s) for [place]. Faxed completed application and required documents into [employer].   [name] P. [name], CPhT [facility] [employer]  [phone]

## 2022-11-30 ENCOUNTER — Telehealth: Payer: Self-pay | Admitting: Pharmacy Technician

## 2022-11-30 DIAGNOSIS — Z596 Low income: Secondary | ICD-10-CM

## 2022-11-30 NOTE — Progress Notes (Addendum)
[facility] Customer service [facility])                                            [facility] Quality Pharmacy Team   12/07/2022-ADDENDUM Care coordination call placed to [place] in regard to [place] application. Spoke to [place] who informs application is still being processed and to check back in a few days for a determination.  Parnell Spieler P. Carolyne Fiscal [facility]  [phone]  11/30/2022  Rachel Hensley 1956-01-18 [id number]  Care coordination call #1 placed to [employer] in regard to [facility] application. Spoke to [place] who informs application was received. She informs application is still process and to check back in a few days for a determination.  Care coordination call #2 placed to [employer] in regard to Ozempic application. Spoke to [facility] who informs patient is APPROVED 11/30/22-12/27/22. Patient will receive a 1 month supply delivered to the provider's office in the next 15 business days. Patient will need to reapply in 2024.  Keane Martelli P. Waymon Laser, CPhT [facility] [employer]  [phone]

## 2022-12-03 NOTE — Assessment & Plan Note (Addendum)
The current medical regimen is effective;  continue present plan and medications. Continue omeprazole 20 mg twice daily.  

## 2022-12-03 NOTE — Assessment & Plan Note (Signed)
Not at goal Continue atorvastatin 20 mg daily and vascepa 1 gm 2 capsules twice daily.  Continue to work on eating a healthy diet and exercise.  Labs drawn today.

## 2022-12-03 NOTE — Assessment & Plan Note (Signed)
Well controlled.  No changes to medicines. Continue Bisoprolol-hydrochlorothiazide 5/6.25 mg once daily. Aspirin 81 mg daily. Continue to work on eating a healthy diet and exercise.  Labs drawn today.

## 2022-12-03 NOTE — Progress Notes (Unsigned)
Subjective:  Patient ID: Rachel Hensley, female    DOB: January 26, 1956  Age: 66 y.o. MRN: [id number]  Chief Complaint  Patient presents with   Diabetes   Hyperlipidemia   Knee Pain   Gastroesophageal Reflux    Diabetes:  Complications: nephropathy.  Glucose checking: daily Glucose logs: 90-100s. Oddly Tuesday was 115-200 over the last 3 day.  Hypoglycemia: no Most recent A1C: 7.7. Current medications:  Synjardy xr 12.04/999 mg was too expensive and could not get PAP, so went back on metformin 1000 mg twice a day last week. Not on trulicity or ozempic due to cost. Finally she got a call and her PAP for trulicity is supposed to be arriving soon to our office. Instead has been taking glimeperide 4 mg one twice daily.  Aspirin 81 mg daily. Not taking kerendia because waiting for PAP.  Last Eye Exam: 02/2022. Foot checks: daily. Not exercising due to knee pain.  Hyperlipidemia: Current medications: Vascepa 1 gm 2 capsules twice a day and atorvastatin 20 mg once daily. Not eating healthy.   Hypertension: Current medications: Bisoprolol-hydrochlorothiazide 5/6.25 mg once daily. Aspirin 81 mg daily. Intolerant to ace inhibitors (angioedema.)  GERD: omeprazole 20 mg twice daily. Unable to decrease or gets breakthrough gerd. Well controlled.   Left lower back pain x 2 weeks. Tylenol has helped some. Out of meloxicam. No sciatica. Has tried heat - helps some.   Diet: fairly healthy Exercise: had to stop.   Current Outpatient Medications on File Prior to Visit  Medication Sig Dispense Refill   aspirin 81 MG chewable tablet Chew 81 mg by mouth daily.     atorvastatin (LIPITOR) 20 MG tablet TAKE 1 TABLET DAILY 90 tablet 1   cetirizine (ZYRTEC) 10 MG tablet Take 10 mg by mouth daily.     EPINEPHrine 0.3 mg/0.3 mL IJ SOAJ injection Inject 0.3 mg into the muscle once.     glimepiride (AMARYL) 4 MG tablet Take 4 mg by mouth 2 (two) times daily.     icosapent Ethyl (VASCEPA) 1 g capsule Take 2  capsules (2 g total) by mouth 2 (two) times daily. 360 capsule 1   metFORMIN (GLUCOPHAGE) 1000 MG tablet Take 1 tablet (1,000 mg total) by mouth 2 (two) times daily with a meal. 180 tablet 0   omeprazole (PRILOSEC) 20 MG capsule Take 1 capsule (20 mg total) by mouth 2 (two) times daily. 180 capsule 1   promethazine (PHENERGAN) 25 MG tablet Take 1 tablet (25 mg total) by mouth every 8 (eight) hours as needed for nausea or vomiting. 20 tablet 0   meloxicam (MOBIC) 15 MG tablet Take 15 mg by mouth daily. (Patient not taking: Reported on 12/04/2022)     ondansetron (ZOFRAN ODT) 4 MG disintegrating tablet Take 1 tablet (4 mg total) by mouth every 8 (eight) hours as needed for nausea or vomiting. (Patient not taking: Reported on 10/05/2022) 30 tablet 0   No current facility-administered medications on file prior to visit.   Past Medical History:  Diagnosis Date   Angioedema 04/05/2018   Hypoglycemia due to type 2 diabetes mellitus (HCC) 08/21/2020   Mixed hyperlipidemia    Nontoxic multinodular goiter    Other vitamin B12 deficiency anemias    Primary insomnia 05/20/2020   Solitary pulmonary nodule    Type 2 diabetes mellitus with other specified complication [facility](HCC)    Past Surgical History:  Procedure Laterality Date   ABDOMINAL HYSTERECTOMY  06/1993   partial; still has both ovaries  HYSTEROTOMY     partial    Family History  Problem Relation Age of Onset   Coronary artery disease Mother    Glaucoma Mother    Colon cancer Father    Diabetes Sister    Atrial fibrillation Sister    Cancer Brother        renal   Kidney cancer Brother    Diabetes type II Brother    Coronary artery disease Brother    Diabetes Brother    Diabetes Brother    Coronary artery disease Brother    Asthma Son        childhood asthma   Arrhythmia Son    Atrial fibrillation Son    Parkinsonism Son    Eczema Grandchild    Atopy Grandchild    Allergic rhinitis Neg Hx    Angioedema Neg Hx    Immunodeficiency  Neg Hx    Urticaria Neg Hx    Breast cancer Neg Hx    Social History   Socioeconomic History   Marital status: Widowed    Spouse name: Not on file   Number of children: 3   Years of education: Not on file   Highest education level: Not on file  Occupational History   Occupation: Sewer  Tobacco Use   Smoking status: Never   Smokeless tobacco: Never  Vaping Use   Vaping Use: Never used  Substance and Sexual Activity   Alcohol use: Never   Drug use: Never   Sexual activity: Not on file  Other Topics Concern   Not on file  Social History Narrative   Not on file   Social Determinants of Health   Financial Resource Strain: Medium Risk (10/08/2022)   Overall Financial Resource Strain (CARDIA)    Difficulty of Paying Living Expenses: Somewhat hard  Food Insecurity: Food Insecurity Present (10/08/2022)   Hunger Vital Sign    Worried About Running Out of Food in the Last Year: Sometimes true    Ran Out of Food in the Last Year: Never true  Transportation Needs: No Transportation Needs (10/08/2022)   PRAPARE - Administrator, Civil Service (Medical): No    Lack of Transportation (Non-Medical): No  Physical Activity: Sufficiently Active (05/29/2022)   Exercise Vital Sign    Days of Exercise per Week: 5 days    Minutes of Exercise per Session: 30 min  Stress: No Stress Concern Present (05/29/2022)   [employer] of Occupational Health - Occupational Stress Questionnaire    Feeling of Stress : Only a little  Social Connections: Moderately Isolated (10/21/2022)   Social Connection and Isolation Panel [NHANES]    Frequency of Communication with Friends and Family: More than three times a week    Frequency of Social Gatherings with Friends and Family: More than three times a week    Attends Religious Services: More than 4 times per year    Active Member of [employer] or Organizations: No    Attends Banker Meetings: Never    Marital Status: Widowed     Review of Systems  Constitutional:  Positive for fatigue. Negative for chills and fever.  HENT:  Negative for congestion, ear pain and sore throat.   Respiratory:  Positive for shortness of breath. Negative for cough.   Cardiovascular:  Positive for chest pain, palpitations and leg swelling.  Gastrointestinal:  Positive for diarrhea. Negative for abdominal pain, constipation, nausea and vomiting.  Genitourinary:  Positive for flank pain. Negative for dysuria and urgency.  Hesitancy  Musculoskeletal:  Positive for arthralgias (bilateral knee pain R>L) and back pain. Negative for myalgias.  Skin:  Negative for rash.  Neurological:  Negative for dizziness and headaches.  Psychiatric/Behavioral:  Negative for dysphoric mood. The patient is not nervous/anxious.      Objective:  BP 134/62   Pulse 76   Temp (!) 97.4 F (36.3 C)   Resp 14   Ht 5' 3.5" (1.613 m)   Wt 157 lb 6.4 oz (71.4 kg)   LMP  (LMP Unknown)   BMI 27.44 kg/m      12/04/2022    7:38 AM 10/05/2022   11:15 AM 07/28/2022    7:38 AM  BP/Weight  Systolic BP 134 110 118  Diastolic BP 62 60 60  Wt. (Lbs) 157.4 147 152  BMI 27.44 kg/m2 23.37 kg/m2 26.5 kg/m2    Physical Exam Vitals reviewed.  Constitutional:      Appearance: Normal appearance. She is normal weight.  Neck:     Vascular: No carotid bruit.  Cardiovascular:     Rate and Rhythm: Normal rate and regular rhythm.     Heart sounds: Normal heart sounds.  Pulmonary:     Effort: Pulmonary effort is normal. No respiratory distress.     Breath sounds: Normal breath sounds.  Abdominal:     General: Abdomen is flat. Bowel sounds are normal.     Palpations: Abdomen is soft.     Tenderness: There is no abdominal tenderness.  Musculoskeletal:        General: Tenderness (lumbar) present.     Right lower leg: Edema present.     Left lower leg: Edema present.  Neurological:     Mental Status: She is alert and oriented to person, place, and time.   Psychiatric:        Mood and Affect: Mood normal.        Behavior: Behavior normal.     Diabetic Foot Exam - Simple   Simple Foot Form Diabetic Foot exam was performed with the following findings: Yes 12/04/2022  8:45 AM  Visual Inspection No deformities, no ulcerations, no other skin breakdown bilaterally: Yes Sensation Testing Intact to touch and monofilament testing bilaterally: Yes Pulse Check Posterior Tibialis and Dorsalis pulse intact bilaterally: Yes Comments      Lab Results  Component Value Date   WBC 10.0 10/05/2022   HGB 12.2 10/05/2022   HCT 38.7 10/05/2022   PLT 369 10/05/2022   GLUCOSE 186 (H) 10/05/2022   CHOL 182 07/28/2022   TRIG 300 (H) 07/28/2022   HDL 45 07/28/2022   LDLCALC 88 07/28/2022   ALT 16 10/05/2022   AST 14 10/05/2022   NA 140 10/05/2022   K 5.1 10/05/2022   CL 100 10/05/2022   CREATININE 0.80 10/05/2022   BUN 27 10/05/2022   CO2 22 10/05/2022   TSH 0.722 10/05/2022   HGBA1C 7.7 (H) 07/28/2022   MICROALBUR 150 10/27/2021      Assessment & Plan:   Problem List Items Addressed This Visit       Cardiovascular and Mediastinum   Hypertension associated with diabetes (HCC) (Chronic)    Well controlled.  No changes to medicines. Continue Bisoprolol-hydrochlorothiazide 5/6.25 mg once daily. Aspirin 81 mg daily. Continue to work on eating a healthy diet and exercise.  Labs drawn today.        Relevant Medications   furosemide (LASIX) 20 MG tablet   metoprolol succinate (TOPROL-XL) 25 MG 24 hr tablet  Digestive   GERD without esophagitis    The current medical regimen is effective;  continue present plan and medications.  Continue omeprazole 20 mg twice daily.          Endocrine   Diabetic glomerulopathy (HCC) - Primary    Control: worsened Recommend check sugars fasting daily. Recommend check feet daily. Recommend annual eye exams. Medicines: Continue metformin 1000 mg twice daily. Sample of ozempic 0.25 mg weekly  x 1 month, then increase to 0.5 mg weekly. Samples given.  Continue to work on eating a healthy diet and exercise.  Labs drawn today.         Relevant Orders   CBC with Differential/Platelet   Comprehensive metabolic panel   Hemoglobin A1c   Microalbumin / creatinine urine ratio (Completed)     Genitourinary   UTI (urinary tract infection)    - Symptoms and UA consistent with UTI -No systemic symptoms or signs of pyelonephritis -Start treatment with 7 day course of cipro.   -We will send urine culture to confirm sensitivities       Relevant Medications   ciprofloxacin (CIPRO) 500 MG tablet   Other Relevant Orders   Urine Culture     Other   Mixed hyperlipidemia    Not at goal Continue atorvastatin 20 mg daily and vascepa 1 gm 2 capsules twice daily.  Continue to work on eating a healthy diet and exercise.  Labs drawn today.        Relevant Medications   furosemide (LASIX) 20 MG tablet   metoprolol succinate (TOPROL-XL) 25 MG 24 hr tablet   Other Relevant Orders   Lipid panel   Pedal edema    Start on lasix 20 mg once daily.       Relevant Medications   furosemide (LASIX) 20 MG tablet   Other Relevant Orders   TSH   Pro b natriuretic peptide (BNP)   Acute left-sided low back pain without sciatica    Start on cipro      Shortness of breath    Check BNP.      Relevant Orders   EKG 12-Lead   POCT URINALYSIS DIP (CLINITEK) (Completed)  .  Meds ordered this encounter  Medications   ciprofloxacin (CIPRO) 500 MG tablet    Sig: Take 1 tablet (500 mg total) by mouth 2 (two) times daily for 7 days.    Dispense:  14 tablet    Refill:  0   furosemide (LASIX) 20 MG tablet    Sig: Take 1 tablet (20 mg total) by mouth daily.    Dispense:  30 tablet    Refill:  0   metoprolol succinate (TOPROL-XL) 25 MG 24 hr tablet    Sig: Take 1 tablet (25 mg total) by mouth daily. Take with or immediately following a meal.    Dispense:  90 tablet    Refill:  0    Orders  Placed This Encounter  Procedures   Urine Culture   CBC with Differential/Platelet   Comprehensive metabolic panel   Hemoglobin A1c   Lipid panel   TSH   Pro b natriuretic peptide (BNP)   Microalbumin / creatinine urine ratio   POCT URINALYSIS DIP (CLINITEK)   EKG 12-Lead     Follow-up: Return in about 3 weeks (around 12/25/2022) for chronic follow up htn, dm, edema.  An After Visit Summary was printed and given to the patient.  Blane Ohara, MD [facility] [phone]

## 2022-12-03 NOTE — Assessment & Plan Note (Addendum)
Control: worsened Recommend check sugars fasting daily. Recommend check feet daily. Recommend annual eye exams. Medicines: Continue metformin 1000 mg twice daily. Sample of ozempic 0.25 mg weekly x 1 month, then increase to 0.5 mg weekly. Samples given.  Continue to work on eating a healthy diet and exercise.  Labs drawn today.

## 2022-12-04 ENCOUNTER — Encounter: Payer: Self-pay | Admitting: Family Medicine

## 2022-12-04 ENCOUNTER — Ambulatory Visit (INDEPENDENT_AMBULATORY_CARE_PROVIDER_SITE_OTHER): Payer: PPO | Admitting: Family Medicine

## 2022-12-04 VITALS — BP 134/62 | HR 76 | Temp 97.4°F | Resp 14 | Ht 63.5 in | Wt 157.4 lb

## 2022-12-04 DIAGNOSIS — E782 Mixed hyperlipidemia: Secondary | ICD-10-CM | POA: Diagnosis not present

## 2022-12-04 DIAGNOSIS — M545 Low back pain, unspecified: Secondary | ICD-10-CM

## 2022-12-04 DIAGNOSIS — E1159 Type 2 diabetes mellitus with other circulatory complications: Secondary | ICD-10-CM

## 2022-12-04 DIAGNOSIS — E1121 Type 2 diabetes mellitus with diabetic nephropathy: Secondary | ICD-10-CM | POA: Diagnosis not present

## 2022-12-04 DIAGNOSIS — I152 Hypertension secondary to endocrine disorders: Secondary | ICD-10-CM | POA: Diagnosis not present

## 2022-12-04 DIAGNOSIS — R600 Localized edema: Secondary | ICD-10-CM

## 2022-12-04 DIAGNOSIS — N3000 Acute cystitis without hematuria: Secondary | ICD-10-CM | POA: Diagnosis not present

## 2022-12-04 DIAGNOSIS — K219 Gastro-esophageal reflux disease without esophagitis: Secondary | ICD-10-CM | POA: Diagnosis not present

## 2022-12-04 DIAGNOSIS — R0602 Shortness of breath: Secondary | ICD-10-CM | POA: Diagnosis not present

## 2022-12-04 DIAGNOSIS — N390 Urinary tract infection, site not specified: Secondary | ICD-10-CM | POA: Insufficient documentation

## 2022-12-04 HISTORY — DX: Localized edema: R60.0

## 2022-12-04 LAB — POCT URINALYSIS DIP (CLINITEK)
Bilirubin, UA: NEGATIVE
Blood, UA: NEGATIVE
Glucose, UA: NEGATIVE mg/dL
Ketones, POC UA: NEGATIVE mg/dL
Nitrite, UA: POSITIVE — AB
Spec Grav, UA: 1.015 (ref 1.010–1.025)
Urobilinogen, UA: 0.2 E.U./dL
pH, UA: 6 (ref 5.0–8.0)

## 2022-12-04 MED ORDER — METOPROLOL SUCCINATE ER 25 MG PO TB24: 25.0000 mg | ORAL_TABLET | Freq: Every day | ORAL | 0 refills | Status: DC

## 2022-12-04 MED ORDER — CIPROFLOXACIN HCL 500 MG PO TABS: 500.0000 mg | ORAL_TABLET | Freq: Two times a day (BID) | ORAL | 0 refills | Status: AC

## 2022-12-04 MED ORDER — FUROSEMIDE 20 MG PO TABS: 20.0000 mg | ORAL_TABLET | Freq: Every day | ORAL | 0 refills | Status: DC

## 2022-12-04 NOTE — Assessment & Plan Note (Signed)
Start on lasix 20 mg once daily.

## 2022-12-04 NOTE — Patient Instructions (Signed)
Hypertension/Edema/Palpitations: Stop bisoprolol/htz Start metoprolol xr 25 mg once daily.  Start lasix 20 mg once daily.   Diabetes: Start ozempic 0.25 g weekly x 1 week, then increase to 0.5 mg weekly.   Bladder infection: Cipro 500 mg one twice daily x 7 days.

## 2022-12-05 LAB — MICROALBUMIN / CREATININE URINE RATIO
Creatinine, Urine: 98.9 mg/dL
Microalb/Creat Ratio: 74 mg/g creat — ABNORMAL HIGH (ref 0–29)
Microalbumin, Urine: 73.4 ug/mL

## 2022-12-06 DIAGNOSIS — R0602 Shortness of breath: Secondary | ICD-10-CM | POA: Insufficient documentation

## 2022-12-06 DIAGNOSIS — M545 Low back pain, unspecified: Secondary | ICD-10-CM | POA: Insufficient documentation

## 2022-12-06 HISTORY — DX: Low back pain, unspecified: M54.50

## 2022-12-06 NOTE — Assessment & Plan Note (Signed)
Start on cipro

## 2022-12-06 NOTE — Assessment & Plan Note (Signed)
-   Symptoms and UA consistent with UTI -No systemic symptoms or signs of pyelonephritis -Start treatment with 7 day course of cipro.   -We will send urine culture to confirm sensitivities

## 2022-12-06 NOTE — Assessment & Plan Note (Signed)
Check BNP 

## 2022-12-07 ENCOUNTER — Other Ambulatory Visit: Payer: PPO

## 2022-12-07 ENCOUNTER — Other Ambulatory Visit: Payer: Self-pay | Admitting: Family Medicine

## 2022-12-07 DIAGNOSIS — E782 Mixed hyperlipidemia: Secondary | ICD-10-CM | POA: Diagnosis not present

## 2022-12-07 DIAGNOSIS — E1121 Type 2 diabetes mellitus with diabetic nephropathy: Secondary | ICD-10-CM | POA: Diagnosis not present

## 2022-12-07 DIAGNOSIS — R600 Localized edema: Secondary | ICD-10-CM | POA: Diagnosis not present

## 2022-12-08 LAB — HEMOGLOBIN A1C
Est. average glucose Bld gHb Est-mCnc: 151 mg/dL
Hgb A1c MFr Bld: 6.9 % — ABNORMAL HIGH (ref 4.8–5.6)

## 2022-12-08 LAB — CBC WITH DIFFERENTIAL/PLATELET
Basophils Absolute: 0 10*3/uL (ref 0.0–0.2)
Basos: 1 %
EOS (ABSOLUTE): 0.4 10*3/uL (ref 0.0–0.4)
Eos: 5 %
Hematocrit: 34.4 % (ref 34.0–46.6)
Hemoglobin: 11 g/dL — ABNORMAL LOW (ref 11.1–15.9)
Immature Grans (Abs): 0 10*3/uL (ref 0.0–0.1)
Immature Granulocytes: 0 %
Lymphocytes Absolute: 2.9 10*3/uL (ref 0.7–3.1)
Lymphs: 39 %
MCH: 25 pg — ABNORMAL LOW (ref 26.6–33.0)
MCHC: 32 g/dL (ref 31.5–35.7)
MCV: 78 fL — ABNORMAL LOW (ref 79–97)
Monocytes Absolute: 0.5 10*3/uL (ref 0.1–0.9)
Monocytes: 7 %
Neutrophils Absolute: 3.6 10*3/uL (ref 1.4–7.0)
Neutrophils: 48 %
Platelets: 327 10*3/uL (ref 150–450)
RBC: 4.4 x10E6/uL (ref 3.77–5.28)
RDW: 16.8 % — ABNORMAL HIGH (ref 11.7–15.4)
WBC: 7.5 10*3/uL (ref 3.4–10.8)

## 2022-12-08 LAB — COMPREHENSIVE METABOLIC PANEL
ALT: 14 IU/L (ref 0–32)
AST: 13 IU/L (ref 0–40)
Albumin/Globulin Ratio: 1.8 (ref 1.2–2.2)
Albumin: 4.2 g/dL (ref 3.9–4.9)
Alkaline Phosphatase: 74 IU/L (ref 44–121)
BUN/Creatinine Ratio: 21 (ref 12–28)
BUN: 16 mg/dL (ref 8–27)
Bilirubin Total: 0.3 mg/dL (ref 0.0–1.2)
CO2: 23 mmol/L (ref 20–29)
Calcium: 9.4 mg/dL (ref 8.7–10.3)
Chloride: 107 mmol/L — ABNORMAL HIGH (ref 96–106)
Creatinine, Ser: 0.78 mg/dL (ref 0.57–1.00)
Globulin, Total: 2.3 g/dL (ref 1.5–4.5)
Glucose: 128 mg/dL — ABNORMAL HIGH (ref 70–99)
Potassium: 4.5 mmol/L (ref 3.5–5.2)
Sodium: 143 mmol/L (ref 134–144)
Total Protein: 6.5 g/dL (ref 6.0–8.5)
eGFR: 83 mL/min/{1.73_m2} (ref >59)

## 2022-12-08 LAB — LIPID PANEL
Chol/HDL Ratio: 2.8 ratio (ref 0.0–4.4)
Cholesterol, Total: 128 mg/dL (ref 100–199)
HDL: 46 mg/dL (ref >39)
LDL Chol Calc (NIH): 58 mg/dL (ref 0–99)
Triglycerides: 141 mg/dL (ref 0–149)
VLDL Cholesterol Cal: 24 mg/dL (ref 5–40)

## 2022-12-08 LAB — TSH: TSH: 1.28 u[IU]/mL (ref 0.450–4.500)

## 2022-12-08 LAB — CARDIOVASCULAR RISK ASSESSMENT

## 2022-12-08 LAB — PRO B NATRIURETIC PEPTIDE: NT-Pro BNP: 285 pg/mL (ref 0–301)

## 2022-12-08 NOTE — Progress Notes (Signed)
Blood count abnormal. Very mild anemia. Add iron studies.  Liver function normal.  Kidney function normal.  Thyroid function normal.  Cholesterol: trigs hugely improved. Continue vascepa ans atorvastatin. HBA1C: 6.9. improved. At goal now. Patient is waiting on PAP for trulicity continue metformin 1000 mg twice daily. Continue glimeperide 4 mg twice daily until trulicity comes in. Continue to check sugars and prior to starting trulicity, call so we can adjust other dm medicines if needed.  Probnp normal. No evidence of chf.  Spilling more protein in urine. Awaiting kerendia PAP.

## 2022-12-10 LAB — URINE CULTURE

## 2022-12-11 ENCOUNTER — Telehealth: Payer: Self-pay | Admitting: Pharmacist

## 2022-12-11 NOTE — Progress Notes (Signed)
[facility] Customer service manager [facility])                                            [facility] Quality Pharmacy Team    12/11/2022  DOLORA RIDGELY February 08, 1956 [id number]  Placed telephonic outreach to Mrs. Bridget Hartshorn, HIPAA identifiers were verified. Mrs. Narvaiz confirms that she received her Ozempic from [employer] for [employer]. Advised patient that Noreene Larsson will mail out the 2024 application for re-enrollment along with an application for Snjardy. Patient advised to hold on to the application for Snjardy until we can verify if she will qualify for Synjardy based on household income.   Provided Mrs. Costin with the update that [place] patient assistance is ongoing as the program is holding her enrolment for January 2024 enrollment. Advised patient that Noreene Larsson will update her in January after [employer] reaches a determination.   All questions answered, patient was appreciative of my call.   Reynold Bowen, PharmD [facility]  [facility] Clinical Pharmacist Office: [phone]

## 2022-12-14 ENCOUNTER — Telehealth: Payer: Self-pay | Admitting: Pharmacy Technician

## 2022-12-14 DIAGNOSIS — Z596 Low income: Secondary | ICD-10-CM

## 2022-12-14 NOTE — Progress Notes (Signed)
[facility] Customer service manager [facility])                                            [facility] Quality Pharmacy Team    12/14/2022  Rachel Hensley [birth year] [id number]                                      Medication Assistance Referral-FOR 2024 RE ENROLLMENT  Referral From: [facility] RPh Tiffany B.  Medication/Company: Franki Monte / [employer] Patient application portion:  Mining engineer portion: Faxed  to Dr. Blane Ohara in January Provider address/fax verified via: Office website  Medication/Company: Kirk Ruths / [employer] Patient application portion:  Mailed Provider application portion: Faxed  to Dr. Blane Ohara once patient mails back her app. She will have to file taxes in 2023 to see if she will even qualify for the program as she did not qualify in 2022. Provider address/fax verified via: Office website  Medication/Company: Cleone Slim Patient application portion:  Mailed Provider application portion: Faxed  to Dr. Blane Ohara once a determination status has been determine for 2022. Patient/provider may not need to submit a new app. Provider address/fax verified via: Office website  [place] P. Arlon Bleier, CPhT [facility] [employer]  [phone]

## 2022-12-16 ENCOUNTER — Ambulatory Visit (INDEPENDENT_AMBULATORY_CARE_PROVIDER_SITE_OTHER): Payer: PPO

## 2022-12-16 ENCOUNTER — Telehealth: Payer: HMO

## 2022-12-16 DIAGNOSIS — F332 Major depressive disorder, recurrent severe without psychotic features: Secondary | ICD-10-CM

## 2022-12-16 DIAGNOSIS — E1159 Type 2 diabetes mellitus with other circulatory complications: Secondary | ICD-10-CM

## 2022-12-16 DIAGNOSIS — F4321 Adjustment disorder with depressed mood: Secondary | ICD-10-CM

## 2022-12-16 DIAGNOSIS — E1121 Type 2 diabetes mellitus with diabetic nephropathy: Secondary | ICD-10-CM

## 2022-12-16 NOTE — Chronic Care Management (AMB) (Signed)
Chronic Care Management   CCM RN Visit Note  12/16/2022 Name: Rachel Hensley MRN: [id number] DOB: 1956/08/23  Subjective: Rachel Hensley is a 66 y.o. year old female who is a primary care patient of Cox, Kirsten, MD. The patient was referred to the Chronic Care Management team for assistance with care management needs subsequent to provider initiation of CCM services and plan of care.    Today's Visit:  Engaged with patient by telephone for follow up visit.     SDOH Interventions Today    Flowsheet Row Most Recent Value  SDOH Interventions   Food Insecurity Interventions Other (Comment)  [patient received food resources from the care guides]  Housing Interventions Intervention Not Indicated  Transportation Interventions Intervention Not Indicated  Utilities Interventions Intervention Not Indicated  Alcohol Usage Interventions Intervention Not Indicated (Score <7)  Financial Strain Interventions [id number] Referral, Other (Comment)  [PAP for Ozempic and other medications]  Physical Activity Interventions Intervention Not Indicated  Stress Interventions Intervention Not Indicated  Social Connections Interventions Other (Comment), Intervention Not Indicated  [has support from her siblings, 3 sons, and 5 grandchildren]         Goals Addressed             This Visit's Progress    CCM (DIABETES) EXPECTED OUTCOME: MONITOR, SELF-MANAGE AND REDUCE SYMPTOMS OF Depression and Grief       Current Barriers:  Care Coordination needs related to GriefShare support and resources in a patient with depression and grief Chronic Disease Management support and education needs related to effective management of depression and grief  Planned Interventions: Evaluation of current treatment plan related to depression and grief and patient's adherence to plan as established by provider. The patient feels she is doing okay right now with her depression and grief. Holidays are always a little hard  but she has good support from her sons, grandchildren, and siblings.  Advised patient to call the office for changes in mood, anxiety, depression, or acute changes in mental health Provided education to patient re: about the GriefShare and the availability of grief support due to the loss of her husband, mom, brother, and other family members over the last few years. Review of the GriefShare program and the resources available Reviewed scheduled/upcoming provider appointments including 12-24-2022 at 9 am Discussed plans with patient for ongoing care management follow up and provided patient with direct contact information for care management team Advised patient to discuss changes in her mental health, depression and grief with provider. Reflective listening and support given Screening for signs and symptoms of depression related to chronic disease state  Assessed social determinant of health barriers  Symptom Management: Attend all scheduled provider appointments Attend church or other social activities Work with the social worker to address care coordination needs and will continue to work with the clinical team to address health care and disease management related needs call the Suicide and Crisis Lifeline: 988 call the [place] National Suicide Prevention Lifeline: 705-535-4326 or TTY: 720-333-9007 TTY 2050762740) to talk to a trained counselor call 1-800-273-TALK (toll free, 24 hour hotline) if experiencing a Mental Health or Behavioral Health Crisis   Follow Up Plan: Telephone follow up appointment with care management team member scheduled for: 02-22-2023 at 0900 am       CCM Expected Outcome:  Monitor, Self-Manage and Reduce Symptoms of Diabetes       Current Barriers:  Care Coordination needs related to loss of job and inability to pay for medications in  a patient with DM Chronic Disease Management support and education needs related to effective management of DM Financial  Constraints- working with pharm D for PAP assistance Difficulty obtaining medications  Lab Results  Component Value Date   HGBA1C 6.9 (H) 12/07/2022     Planned Interventions: Provided education to patient about basic DM disease process; Reviewed medications with patient and discussed importance of medication adherence. The patient has received Ozempic and is on the 0.25mg  dose. She is doing well with the Ozempic at present. She is working with the pharm D on PAP assistance and ongoing support and education;        Reviewed prescribed diet with patient heart healthy/ADA diet. Review and education. The patient is eating a snack before bed. She states that she is not hungry and sometimes she forgets to eat until it is late. The patient states that her sugar was a little elevated on 12-15-2022 because she ate late; Counseled on importance of regular laboratory monitoring as prescribed. Has regular lab work ;        Discussed plans with patient for ongoing care management follow up and provided patient with direct contact information for care management team;      Provided patient with written educational materials related to hypo and hyperglycemia and importance of correct treatment. The highest she has seen lately is 180;       Reviewed scheduled/upcoming provider appointments including: 12-04-2022 at 730 am;         Advised patient, providing education and rationale, to check cbg when you have symptoms of low or high blood sugar and as directed   and record. The patient checks her blood sugars. This am her blood sugar was 125      call provider for findings outside established parameters;       Referral made to pharmacy team for assistance with financial difficulties of being able to afford medications for effective management of DM;       Referral made to social work team for assistance with financial barriers, grief and depression. Has been working with the LCSW;      Review of patient status,  including review of consultants reports, relevant laboratory and other test results, and medications completed;       Advised patient to discuss changes in her DM health and well being with provider;      Screening for signs and symptoms of depression related to chronic disease state;        Assessed social determinant of health barriers;       Patient to schedule an appointment with the eye doctor after the first of the year.  Symptom Management: Take medications as prescribed   Attend all scheduled provider appointments Call pharmacy for medication refills 3-7 days in advance of running out of medications Call provider office for new concerns or questions  Work with the social worker to address care coordination needs and will continue to work with the clinical team to address health care and disease management related needs call the Suicide and Crisis Lifeline: 988 call the [place] National Suicide Prevention Lifeline: 323-114-7212 or TTY: 289-665-9827 TTY (437)129-2754) to talk to a trained counselor call 1-800-273-TALK (toll free, 24 hour hotline) if experiencing a Mental Health or Behavioral Health Crisis  schedule appointment with eye doctor check feet daily for cuts, sores or redness trim toenails straight across fill half of plate with vegetables manage portion size prepare main meal at home 3 to 5 days each week  wash and dry feet carefully every day wear comfortable, cotton socks wear comfortable, well-fitting shoes  Follow Up Plan: Telephone follow up appointment with care management team member scheduled for: 02-22-2023 at 0900 am       CCM Expected Outcome:  Monitor, Self-Manage, and Reduce Symptoms of Hypertension       Current Barriers:  Chronic Disease Management support and education needs related to effective management of DM Financial Constraints.  BP Readings from Last 3 Encounters:  12/04/22 134/62  10/05/22 110/60  07/28/22 118/60     Planned  Interventions: Evaluation of current treatment plan related to hypertension self management and patient's adherence to plan as established by provider. The patient is compliant with the plan of care. Denies any new concerns with HTN;   Provided education to patient re: stroke prevention, s/s of heart attack and stroke; Reviewed prescribed diet heart healthy/ADA diet. The patient is eating healthy. Sometimes eats late due to no appetite from the medications she is taking for her DM. Review of healthy snacks  Reviewed medications with patient and discussed importance of compliance. The patient is compliant with medications. Works with pharm D for medication assistance and support;  Discussed plans with patient for ongoing care management follow up and provided patient with direct contact information for care management team; Advised patient, providing education and rationale, to monitor blood pressure daily and record, calling PCP for findings outside established parameters;  Advised patient to discuss changes in blood pressure or other heart concerns  with provider; Provided education on prescribed diet heart healthy/ADA diet ;  Discussed complications of poorly controlled blood pressure such as heart disease, stroke, circulatory complications, vision complications, kidney impairment, sexual dysfunction;  Next pcp appointment on 12-24-2022 for follow up of bladder infection the patient had at her visit on 12-04-2022  Symptom Management: Take medications as prescribed   Attend all scheduled provider appointments Call pharmacy for medication refills 3-7 days in advance of running out of medications Call provider office for new concerns or questions  call the Suicide and Crisis Lifeline: 988 call the [place] National Suicide Prevention Lifeline: 814-057-8306 or TTY: 251-179-9830 TTY (715) 601-5821) to talk to a trained counselor call 1-800-273-TALK (toll free, 24 hour hotline) if experiencing a Mental  Health or Behavioral Health Crisis  check blood pressure weekly learn about high blood pressure call doctor for signs and symptoms of high blood pressure develop an action plan for high blood pressure keep all doctor appointments take medications for blood pressure exactly as prescribed report new symptoms to your doctor  Follow Up Plan: Telephone follow up appointment with care management team member scheduled for: 02-22-2023 at 0900 am          Plan:Telephone follow up appointment with care management team member scheduled for:  02-22-2022 at 0930 am  Alto Denver RN, MSN, CCM RN Care Manager  Chronic Care Management Direct Number: [phone]

## 2022-12-16 NOTE — Plan of Care (Signed)
Chronic Care Management Provider Comprehensive Care Plan    12/16/2022 Name: Rachel Hensley MRN: [id number] DOB: 1956/06/25  Referral to Chronic Care Management (CCM) services was placed by Provider:  Dr. Blane Ohara on Date: 10-05-2022.  Chronic Condition 1: HTN Provider Assessment and Plan  Has been well controlled.  Unfortunately she is unable to afford her Trulicity, Synjardy XR, [place]. Referred to chronic care management in the hopes that they are able to get her some patient assistance.         Relevant Orders    AMB Referral to Chronic Care Management Services     Expected Outcome/Goals Addressed This Visit (Provider CCM goals/Provider Assessment and plan   CCM Expected Outcome: Monitor, Self-Manage, and Reduce Symptoms of Hypertension   Symptom Management Condition 1: Take all medications as prescribed Attend all scheduled provider appointments Call provider office for new concerns or questions  call the Suicide and Crisis Lifeline: 988 call the [place] National Suicide Prevention Lifeline: 207 291 2966 or TTY: 530-092-7679 TTY (317)154-5038) to talk to a trained counselor call 1-800-273-TALK (toll free, 24 hour hotline) if experiencing a Mental Health or Behavioral Health Crisis  check blood pressure weekly learn about high blood pressure call doctor for signs and symptoms of high blood pressure keep all doctor appointments take medications for blood pressure exactly as prescribed report new symptoms to your doctor  Chronic Condition 2: DM Provider Assessment and Plan  Diabetes well controlled on current medications.  Refer to chronic care management in order to help with patient assistance.          Relevant Orders    AMB Referral to Chronic Care Management Services    Hemoglobin A1c             Expected Outcome/Goals Addressed This Visit (Provider CCM goals/Provider Assessment and plan   CCM Expected Outcome:  Monitor, Self-Manage and  Reduce Symptoms of Diabetes   Symptom Management Condition 2: Take all medications as prescribed Attend all scheduled provider appointments Call provider office for new concerns or questions  call the Suicide and Crisis Lifeline: 988 call the [place] National Suicide Prevention Lifeline: 646 140 3864 or TTY: (510) 789-8858 TTY 501-484-3434) to talk to a trained counselor call 1-800-273-TALK (toll free, 24 hour hotline) if experiencing a Mental Health or Behavioral Health Crisis  schedule appointment with eye doctor check feet daily for cuts, sores or redness trim toenails straight across manage portion size wash and dry feet carefully every day wear comfortable, cotton socks wear comfortable, well-fitting shoes  Chronic Condition 3: Depression and Grief Provider Assessment and Plan Mood and Affect: Mood normal.        Behavior: Behavior normal.    Expected Outcome/Goals Addressed This Visit (Provider CCM goals/Provider Assessment and plan   CCM Expected Outcome:  Monitor, Self-Manage and Reduce Symptoms of Depression and Grief    Symptom Management Condition 3: Take all medications as prescribed Attend all scheduled provider appointments Call provider office for new concerns or questions  call the Suicide and Crisis Lifeline: 988 call the [place] National Suicide Prevention Lifeline: 316-564-7456 or TTY: 573-007-7783 TTY (340) 007-8799) to talk to a trained counselor call 1-800-273-TALK (toll free, 24 hour hotline) if experiencing a Mental Health or Behavioral Health Crisis   Problem List Patient Active Problem List   Diagnosis Date Noted   Acute left-sided low back pain without sciatica 12/06/2022   Shortness of breath 12/06/2022   Pedal edema 12/04/2022   UTI (urinary tract infection) 12/04/2022   Atypical chest  pain 10/05/2022   Palpitations 10/05/2022   Hypomagnesemia 10/05/2022   Need for immunization against influenza 10/05/2022   ACEI/ARB contraindicated 01/21/2022    Impingement syndrome of right shoulder 10/19/2021   Nodule of skin of right lower leg 10/19/2021   Primary osteoarthritis of left hip 05/20/2020   Lumbar back pain 05/07/2020   Diabetic glomerulopathy (HCC) 02/19/2020   Mixed hyperlipidemia 02/19/2020   Hypertension associated with diabetes (HCC) 02/19/2020   GERD without esophagitis 02/19/2020    Medication Management  Current Outpatient Medications:    aspirin 81 MG chewable tablet, Chew 81 mg by mouth daily., Disp: , Rfl:    atorvastatin (LIPITOR) 20 MG tablet, TAKE 1 TABLET DAILY, Disp: 90 tablet, Rfl: 1   cetirizine (ZYRTEC) 10 MG tablet, Take 10 mg by mouth daily., Disp: , Rfl:    EPINEPHrine 0.3 mg/0.3 mL IJ SOAJ injection, Inject 0.3 mg into the muscle once., Disp: , Rfl:    furosemide (LASIX) 20 MG tablet, Take 1 tablet (20 mg total) by mouth daily., Disp: 30 tablet, Rfl: 0   glimepiride (AMARYL) 4 MG tablet, Take 4 mg by mouth 2 (two) times daily., Disp: , Rfl:    icosapent Ethyl (VASCEPA) 1 g capsule, Take 2 capsules (2 g total) by mouth 2 (two) times daily., Disp: 360 capsule, Rfl: 1   meloxicam (MOBIC) 15 MG tablet, Take 15 mg by mouth daily. (Patient not taking: Reported on 12/04/2022), Disp: , Rfl:    metFORMIN (GLUCOPHAGE) 1000 MG tablet, Take 1 tablet (1,000 mg total) by mouth 2 (two) times daily with a meal., Disp: 180 tablet, Rfl: 0   metoprolol succinate (TOPROL-XL) 25 MG 24 hr tablet, Take 1 tablet (25 mg total) by mouth daily. Take with or immediately following a meal., Disp: 90 tablet, Rfl: 0   omeprazole (PRILOSEC) 20 MG capsule, Take 1 capsule (20 mg total) by mouth 2 (two) times daily., Disp: 180 capsule, Rfl: 1   ondansetron (ZOFRAN ODT) 4 MG disintegrating tablet, Take 1 tablet (4 mg total) by mouth every 8 (eight) hours as needed for nausea or vomiting. (Patient not taking: Reported on 10/05/2022), Disp: 30 tablet, Rfl: 0   promethazine (PHENERGAN) 25 MG tablet, Take 1 tablet (25 mg total) by mouth every 8 (eight)  hours as needed for nausea or vomiting., Disp: 20 tablet, Rfl: 0  Cognitive Assessment Identity Confirmed: : Name; DOB Cognitive Status: Normal   Functional Assessment Hearing Difficulty or Deaf: no Wear Glasses or Blind: yes Vision Management: wears glasses Concentrating, Remembering or Making Decisions Difficulty (CP): no Difficulty Communicating: no Difficulty Eating/Swallowing: no Walking or Climbing Stairs Difficulty: no Dressing/Bathing Difficulty: no Doing Errands Independently Difficulty (such as shopping) (CP): no Change in Functional Status Since Onset of Current Illness/Injury: no   Caregiver Assessment  Primary Source of Support/Comfort: child(ren) Name of Support/Comfort Primary Source: has 3 sons that live close by if needed. They are a good support for her People in Home: alone Family Caregiver if Needed: child(ren), adult Family Caregiver Names: 3 sons Primary Roles/Responsibilities: retired   Planned Interventions  Evaluation of current treatment plan related to depression and grief and patient's adherence to plan as established by provider. The patient feels she is doing okay right now with her depression and grief. Holidays are always a little hard but she has good support from her sons, grandchildren, and siblings.  Advised patient to call the office for changes in mood, anxiety, depression, or acute changes in mental health Provided education to patient re:  about the [facility] and the availability of grief support due to the loss of her husband, mom, brother, and other family members over the last few years. Review of the [facility] program and the resources available Reviewed scheduled/upcoming provider appointments including 12-24-2022 at 9 am Discussed plans with patient for ongoing care management follow up and provided patient with direct contact information for care management team Advised patient to discuss changes in her mental health, depression and  grief with provider. Reflective listening and support given Screening for signs and symptoms of depression related to chronic disease state  Assessed social determinant of health barriers Provided education to patient about basic DM disease process; Reviewed medications with patient and discussed importance of medication adherence. The patient has received Ozempic and is on the 0.25mg  dose. She is doing well with the Ozempic at present. She is working with the pharm D on PAP assistance and ongoing support and education;        Reviewed prescribed diet with patient heart healthy/ADA diet. Review and education. The patient is eating a snack before bed. She states that she is not hungry and sometimes she forgets to eat until it is late. The patient states that her sugar was a little elevated on 12-15-2022 because she ate late; Counseled on importance of regular laboratory monitoring as prescribed. Has regular lab work ;        Discussed plans with patient for ongoing care management follow up and provided patient with direct contact information for care management team;      Provided patient with written educational materials related to hypo and hyperglycemia and importance of correct treatment. The highest she has seen lately is 180;       Reviewed scheduled/upcoming provider appointments including: 12-04-2022 at 730 am;         Advised patient, providing education and rationale, to check cbg when you have symptoms of low or high blood sugar and as directed   and record. The patient checks her blood sugars. This am her blood sugar was 125      call provider for findings outside established parameters;       Referral made to pharmacy team for assistance with financial difficulties of being able to afford medications for effective management of DM;       Referral made to social work team for assistance with financial barriers, grief and depression. Has been working with the LCSW;      Review of patient  status, including review of consultants reports, relevant laboratory and other test results, and medications completed;       Advised patient to discuss changes in her DM health and well being with provider;      Screening for signs and symptoms of depression related to chronic disease state;        Assessed social determinant of health barriers;       Patient to schedule an appointment with the eye doctor after the first of the year. Evaluation of current treatment plan related to hypertension self management and patient's adherence to plan as established by provider. The patient is compliant with the plan of care. Denies any new concerns with HTN;   Provided education to patient re: stroke prevention, s/s of heart attack and stroke; Reviewed prescribed diet heart healthy/ADA diet. The patient is eating healthy. Sometimes eats late due to no appetite from the medications she is taking for her DM. Review of healthy snacks  Reviewed medications with patient and discussed importance of  compliance. The patient is compliant with medications. Works with pharm D for medication assistance and support;  Discussed plans with patient for ongoing care management follow up and provided patient with direct contact information for care management team; Advised patient, providing education and rationale, to monitor blood pressure daily and record, calling PCP for findings outside established parameters;  Advised patient to discuss changes in blood pressure or other heart concerns  with provider; Provided education on prescribed diet heart healthy/ADA diet ;  Discussed complications of poorly controlled blood pressure such as heart disease, stroke, circulatory complications, vision complications, kidney impairment, sexual dysfunction;  Next pcp appointment on 12-24-2022 for follow up of bladder infection the patient had at her visit on 12-04-2022  Interaction and coordination with outside resources, practitioners,  and providers See CCM Referral  Care Plan: Available in MyChart

## 2022-12-16 NOTE — Patient Instructions (Signed)
Please call the care guide team at [phone] if you need to cancel or reschedule your appointment.   If you are experiencing a Mental Health or [place] or need someone to talk to, please call the Suicide and Crisis Lifeline: 988 call the [place] National Suicide Prevention Lifeline: (581) 140-1257 or TTY: 254-828-3428 TTY (332)354-5276) to talk to a trained counselor call 1-800-273-TALK (toll free, 24 hour hotline)   Following is a copy of the CCM Program Consent:  CCM service includes personalized support from designated clinical staff supervised by the physician, including individualized plan of care and coordination with other care providers 24/7 contact phone numbers for assistance for urgent and routine care needs. Service will only be billed when office clinical staff spend 20 minutes or more in a month to coordinate care. Only one practitioner may furnish and bill the service in a calendar month. The patient may stop CCM services at amy time (effective at the end of the month) by phone call to the office staff. The patient will be responsible for cost sharing (co-pay) or up to 20% of the service fee (after annual deductible is met)  Following is a copy of your full provider care plan:   Goals Addressed             This Visit's Progress    CCM (DIABETES) EXPECTED OUTCOME: MONITOR, SELF-MANAGE AND REDUCE SYMPTOMS OF Depression and Grief       Current Barriers:  Care Coordination needs related to [place] support and resources in a patient with depression and grief Chronic Disease Management support and education needs related to effective management of depression and grief  Planned Interventions: Evaluation of current treatment plan related to depression and grief and patient's adherence to plan as established by provider. The patient feels she is doing okay right now with her depression and grief. Holidays are always a little hard but she has good support from her  sons, grandchildren, and siblings.  Advised patient to call the office for changes in mood, anxiety, depression, or acute changes in mental health Provided education to patient re: about the [place] and the availability of grief support due to the loss of her husband, mom, brother, and other family members over the last few years. Review of the [place] program and the resources available Reviewed scheduled/upcoming provider appointments including 12-24-2022 at 9 am Discussed plans with patient for ongoing care management follow up and provided patient with direct contact information for care management team Advised patient to discuss changes in her mental health, depression and grief with provider. Reflective listening and support given Screening for signs and symptoms of depression related to chronic disease state  Assessed social determinant of health barriers  Symptom Management: Attend all scheduled provider appointments Attend church or other social activities Work with the social worker to address care coordination needs and will continue to work with the clinical team to address health care and disease management related needs call the Suicide and Crisis Lifeline: 988 call the [place] National Suicide Prevention Lifeline: 281-386-7957 or TTY: 831-252-7820 TTY 863 243 6929) to talk to a trained counselor call 1-800-273-TALK (toll free, 24 hour hotline) if experiencing a Mental Health or [place]   Follow Up Plan: Telephone follow up appointment with care management team member scheduled for: 02-22-2023 at 0900 am       CCM Expected Outcome:  Monitor, Self-Manage and Reduce Symptoms of Diabetes       Current Barriers:  Care Coordination needs related to loss  of job and inability to pay for medications in a patient with DM Chronic Disease Management support and education needs related to effective management of DM Financial Constraints- working with pharm D for PAP  assistance Difficulty obtaining medications  Lab Results  Component Value Date   HGBA1C 6.9 (H) 12/07/2022     Planned Interventions: Provided education to patient about basic DM disease process; Reviewed medications with patient and discussed importance of medication adherence. The patient has received Ozempic and is on the 0.64m dose. She is doing well with the Ozempic at present. She is working with the pharm D on PAP assistance and ongoing support and education;        Reviewed prescribed diet with patient heart healthy/ADA diet. Review and education. The patient is eating a snack before bed. She states that she is not hungry and sometimes she forgets to eat until it is late. The patient states that her sugar was a little elevated on 12-15-2022 because she ate late; Counseled on importance of regular laboratory monitoring as prescribed. Has regular lab work ;        Discussed plans with patient for ongoing care management follow up and provided patient with direct contact information for care management team;      Provided patient with written educational materials related to hypo and hyperglycemia and importance of correct treatment. The highest she has seen lately is 180;       Reviewed scheduled/upcoming provider appointments including: 12-04-2022 at 730 am;         Advised patient, providing education and rationale, to check cbg when you have symptoms of low or high blood sugar and as directed   and record. The patient checks her blood sugars. This am her blood sugar was 125      call provider for findings outside established parameters;       Referral made to pharmacy team for assistance with financial difficulties of being able to afford medications for effective management of DM;       Referral made to social work team for assistance with financial barriers, grief and depression. Has been working with the LCSW;      Review of patient status, including review of consultants reports,  relevant laboratory and other test results, and medications completed;       Advised patient to discuss changes in her DM health and well being with provider;      Screening for signs and symptoms of depression related to chronic disease state;        Assessed social determinant of health barriers;       Patient to schedule an appointment with the eye doctor after the first of the year.  Symptom Management: Take medications as prescribed   Attend all scheduled provider appointments Call pharmacy for medication refills 3-7 days in advance of running out of medications Call provider office for new concerns or questions  Work with the social worker to address care coordination needs and will continue to work with the clinical team to address health care and disease management related needs call the Suicide and Crisis Lifeline: 988 call the [place] Lifeline: 17312488741or TTY: 1249-139-7043TTY (304 776 4642 to talk to a trained counselor call 1-800-273-TALK (toll free, 24 hour hotline) if experiencing a Mental Health or [place] schedule appointment with eye doctor check feet daily for cuts, sores or redness trim toenails straight across fill half of plate with vegetables manage portion size prepare main  meal at home 3 to 5 days each week wash and dry feet carefully every day wear comfortable, cotton socks wear comfortable, well-fitting shoes  Follow Up Plan: Telephone follow up appointment with care management team member scheduled for: 02-22-2023 at 0900 am       CCM Expected Outcome:  Monitor, Self-Manage, and Reduce Symptoms of Hypertension       Current Barriers:  Chronic Disease Management support and education needs related to effective management of DM Financial Constraints.  BP Readings from Last 3 Encounters:  12/04/22 134/62  10/05/22 110/60  07/28/22 118/60     Planned Interventions: Evaluation of current treatment plan  related to hypertension self management and patient's adherence to plan as established by provider. The patient is compliant with the plan of care. Denies any new concerns with HTN;   Provided education to patient re: stroke prevention, s/s of heart attack and stroke; Reviewed prescribed diet heart healthy/ADA diet. The patient is eating healthy. Sometimes eats late due to no appetite from the medications she is taking for her DM. Review of healthy snacks  Reviewed medications with patient and discussed importance of compliance. The patient is compliant with medications. Works with [place] D for medication assistance and support;  Discussed plans with patient for ongoing care management follow up and provided patient with direct contact information for care management team; Advised patient, providing education and rationale, to monitor blood pressure daily and record, calling PCP for findings outside established parameters;  Advised patient to discuss changes in blood pressure or other heart concerns  with provider; Provided education on prescribed diet heart healthy/ADA diet ;  Discussed complications of poorly controlled blood pressure such as heart disease, stroke, circulatory complications, vision complications, kidney impairment, sexual dysfunction;  Next pcp appointment on 12-24-2022 for follow up of bladder infection the patient had at her visit on 12-04-2022  Symptom Management: Take medications as prescribed   Attend all scheduled provider appointments Call pharmacy for medication refills 3-7 days in advance of running out of medications Call provider office for new concerns or questions  call the Suicide and Crisis Lifeline: 988 call the [place] National Suicide Prevention Lifeline: (319)843-5967 or TTY: 669-569-1630 TTY 7436554874) to talk to a trained counselor call 1-800-273-TALK (toll free, 24 hour hotline) if experiencing a Mental Health or [place]  check blood  pressure weekly learn about high blood pressure call doctor for signs and symptoms of high blood pressure develop an action plan for high blood pressure keep all doctor appointments take medications for blood pressure exactly as prescribed report new symptoms to your doctor  Follow Up Plan: Telephone follow up appointment with care management team member scheduled for: 02-22-2023 at 0900 am          Patient verbalizes understanding of instructions and care plan provided today and agrees to view in [place]. Active MyChart status and patient understanding of how to access instructions and care plan via MyChart confirmed with patient.     Telephone follow up appointment with care management team member scheduled for: 02-22-2023 at 0900 am

## 2022-12-23 NOTE — Progress Notes (Unsigned)
Subjective:  Patient ID: Rachel Hensley, female    DOB: 07-30-1956  Age: 66 y.o. MRN: [id number]  Chief Complaint:  HPI: Patient is here for a regular follow up in DM, hyperlipidemia and hypertension.  Diabetes Mellitus Type II, follow-up  Lab Results  Component Value Date   HGBA1C 6.9 (H) 12/07/2022   HGBA1C 7.7 (H) 07/28/2022   HGBA1C 7.0 (H) 04/24/2022   Last seen for diabetes {1-12:18279} {days/wks/mos/yrs:310907} ago.  Management since then includes continuing the same treatment. She reports good compliance with treatment. She is not having side effects.  Home blood sugar records: fasting range: *** and postprandial range: ***  Episodes of hypoglycemia? No {enter details if yes:1}   Current insulin regiment: {***Type 'None' if not taking insulin                                                otherwise enter complete                                                 details of insulin regiment:1} Most Recent Eye Exam: ***  --------------------------------------------------------------------------------------------------- Hypertension, follow-up  BP Readings from Last 3 Encounters:  12/04/22 134/62  10/05/22 110/60  07/28/22 118/60   Wt Readings from Last 3 Encounters:  12/04/22 157 lb 6.4 oz (71.4 kg)  10/05/22 147 lb (66.7 kg)  07/28/22 152 lb (68.9 kg)     She was last seen for hypertension {NUMBERS 1-12:18279} {days/wks/mos/yrs:310907} ago.  BP at that visit was ***. Management since that visit includes ***. She reports {excellent/good/fair/poor:19665} compliance with treatment. She {is/is not:9024} having side effects. {document side effects if present:1} She {is/is not:9024} exercising. She {is/is not:9024} adherent to low salt diet.   Outside blood pressures are {enter patient reported home BP, or 'not being checked':1}.  She {does/does not:200015} smoke.  Use of agents associated with hypertension: {bp agents assoc with hypertension:511::"none"}.    --------------------------------------------------------------------------------------------------- Lipid/Cholesterol, follow-up  Last Lipid Panel: Lab Results  Component Value Date   CHOL 128 12/07/2022   LDLCALC 58 12/07/2022   HDL 46 12/07/2022   TRIG 141 12/07/2022    She was last seen for this {1-12:18279} {days/wks/mos/yrs:310907} ago.  Management since that visit includes ***.  She reports {excellent/good/fair/poor:19665} compliance with treatment. She {is/is not:9024} having side effects. {document side effects if present:1}  She is following a {diet:21022986} diet. Current exercise: {exercise types:16438}  Last metabolic panel Lab Results  Component Value Date   GLUCOSE 128 (H) 12/07/2022   NA 143 12/07/2022   K 4.5 12/07/2022   BUN 16 12/07/2022   CREATININE 0.78 12/07/2022   GFRNONAA 88 02/17/2021   GFRAA 101 02/17/2021   CALCIUM 9.4 12/07/2022   AST 13 12/07/2022   ALT 14 12/07/2022   The ASCVD Risk score (Arnett DK, et al., 2019) failed to calculate for the following reasons:   The valid total cholesterol range is 130 to 320 mg/dL  ---------------------------------------------------------------------------------------------------    Current Outpatient Medications on File Prior to Visit  Medication Sig Dispense Refill   aspirin 81 MG chewable tablet Chew 81 mg by mouth daily.     atorvastatin (LIPITOR) 20 MG tablet TAKE 1 TABLET DAILY 90 tablet 1   cetirizine (ZYRTEC)  10 MG tablet Take 10 mg by mouth daily.     EPINEPHrine 0.3 mg/0.3 mL IJ SOAJ injection Inject 0.3 mg into the muscle once.     furosemide (LASIX) 20 MG tablet Take 1 tablet (20 mg total) by mouth daily. 30 tablet 0   glimepiride (AMARYL) 4 MG tablet Take 4 mg by mouth 2 (two) times daily.     icosapent Ethyl (VASCEPA) 1 g capsule Take 2 capsules (2 g total) by mouth 2 (two) times daily. 360 capsule 1   meloxicam (MOBIC) 15 MG tablet Take 15 mg by mouth daily. (Patient not taking:  Reported on 12/04/2022)     metFORMIN (GLUCOPHAGE) 1000 MG tablet Take 1 tablet (1,000 mg total) by mouth 2 (two) times daily with a meal. 180 tablet 0   metoprolol succinate (TOPROL-XL) 25 MG 24 hr tablet Take 1 tablet (25 mg total) by mouth daily. Take with or immediately following a meal. 90 tablet 0   omeprazole (PRILOSEC) 20 MG capsule Take 1 capsule (20 mg total) by mouth 2 (two) times daily. 180 capsule 1   ondansetron (ZOFRAN ODT) 4 MG disintegrating tablet Take 1 tablet (4 mg total) by mouth every 8 (eight) hours as needed for nausea or vomiting. (Patient not taking: Reported on 10/05/2022) 30 tablet 0   promethazine (PHENERGAN) 25 MG tablet Take 1 tablet (25 mg total) by mouth every 8 (eight) hours as needed for nausea or vomiting. 20 tablet 0   No current facility-administered medications on file prior to visit.   Past Medical History:  Diagnosis Date   Angioedema 04/05/2018   Hypoglycemia due to type 2 diabetes mellitus (HCC) 08/21/2020   Mixed hyperlipidemia    Nontoxic multinodular goiter    Other vitamin B12 deficiency anemias    Primary insomnia 05/20/2020   Solitary pulmonary nodule    Type 2 diabetes mellitus with other specified complication [facility])    Past Surgical History:  Procedure Laterality Date   ABDOMINAL HYSTERECTOMY  06/1993   partial; still has both ovaries   HYSTEROTOMY     partial    Family History  Problem Relation Age of Onset   Coronary artery disease Mother    Glaucoma Mother    Colon cancer Father    Diabetes Sister    Atrial fibrillation Sister    Cancer Brother        renal   Kidney cancer Brother    Diabetes type II Brother    Coronary artery disease Brother    Diabetes Brother    Diabetes Brother    Coronary artery disease Brother    Asthma Son        childhood asthma   Arrhythmia Son    Atrial fibrillation Son    Parkinsonism Son    Eczema Grandchild    Atopy Grandchild    Allergic rhinitis Neg Hx    Angioedema Neg Hx     Immunodeficiency Neg Hx    Urticaria Neg Hx    Breast cancer Neg Hx    Social History   Socioeconomic History   Marital status: Widowed    Spouse name: Not on file   Number of children: 3   Years of education: Not on file   Highest education level: Not on file  Occupational History   Occupation: Sewer  Tobacco Use   Smoking status: Never   Smokeless tobacco: Never  Vaping Use   Vaping Use: Never used  Substance and Sexual Activity   Alcohol use: Never  Drug use: Never   Sexual activity: Not on file  Other Topics Concern   Not on file  Social History Narrative   Not on file   Social Determinants of Health   Financial Resource Strain: Medium Risk (12/16/2022)   Overall Financial Resource Strain (CARDIA)    Difficulty of Paying Living Expenses: Somewhat hard  Food Insecurity: No Food Insecurity (12/16/2022)   Hunger Vital Sign    Worried About Running Out of Food in the Last Year: Never true    Ran Out of Food in the Last Year: Never true  Recent Concern: Food Insecurity - Food Insecurity Present (10/08/2022)   Hunger Vital Sign    Worried About Running Out of Food in the Last Year: Sometimes true    Ran Out of Food in the Last Year: Never true  Transportation Needs: No Transportation Needs (12/16/2022)   PRAPARE - Administrator, Civil Service (Medical): No    Lack of Transportation (Non-Medical): No  Physical Activity: Sufficiently Active (12/16/2022)   Exercise Vital Sign    Days of Exercise per Week: 5 days    Minutes of Exercise per Session: 30 min  Stress: No Stress Concern Present (12/16/2022)   [employer] of Occupational Health - Occupational Stress Questionnaire    Feeling of Stress : Only a little  Social Connections: Moderately Isolated (12/16/2022)   Social Connection and Isolation Panel [NHANES]    Frequency of Communication with Friends and Family: More than three times a week    Frequency of Social Gatherings with Friends and  Family: More than three times a week    Attends Religious Services: More than 4 times per year    Active Member of [employer] or Organizations: No    Attends Banker Meetings: Never    Marital Status: Widowed    Review of Systems   Objective:  LMP  (LMP Unknown)      12/04/2022    7:38 AM 10/05/2022   11:15 AM 07/28/2022    7:38 AM  BP/Weight  Systolic BP 134 110 118  Diastolic BP 62 60 60  Wt. (Lbs) 157.4 147 152  BMI 27.44 kg/m2 23.37 kg/m2 26.5 kg/m2    Physical Exam  Lab Results  Component Value Date   WBC 7.5 12/07/2022   HGB 11.0 (L) 12/07/2022   HCT 34.4 12/07/2022   PLT 327 12/07/2022   GLUCOSE 128 (H) 12/07/2022   CHOL 128 12/07/2022   TRIG 141 12/07/2022   HDL 46 12/07/2022   LDLCALC 58 12/07/2022   ALT 14 12/07/2022   AST 13 12/07/2022   NA 143 12/07/2022   K 4.5 12/07/2022   CL 107 (H) 12/07/2022   CREATININE 0.78 12/07/2022   BUN 16 12/07/2022   CO2 23 12/07/2022   TSH 1.280 12/07/2022   HGBA1C 6.9 (H) 12/07/2022   MICROALBUR 150 10/27/2021      Assessment & Plan:   Problem List Items Addressed This Visit   None .  No orders of the defined types were placed in this encounter.   No orders of the defined types were placed in this encounter.    Follow-up: No follow-ups on file.  An After Visit Summary was printed and given to the patient.  Lurline Del, FNP [facility] [phone]

## 2022-12-24 ENCOUNTER — Encounter: Payer: Self-pay | Admitting: Nurse Practitioner

## 2022-12-24 ENCOUNTER — Ambulatory Visit (INDEPENDENT_AMBULATORY_CARE_PROVIDER_SITE_OTHER): Payer: PPO | Admitting: Nurse Practitioner

## 2022-12-24 VITALS — BP 140/80 | HR 82 | Temp 97.3°F | Resp 16 | Ht 63.0 in | Wt 160.0 lb

## 2022-12-24 DIAGNOSIS — D508 Other iron deficiency anemias: Secondary | ICD-10-CM | POA: Diagnosis not present

## 2022-12-24 DIAGNOSIS — E119 Type 2 diabetes mellitus without complications: Secondary | ICD-10-CM | POA: Diagnosis not present

## 2022-12-24 DIAGNOSIS — E1159 Type 2 diabetes mellitus with other circulatory complications: Secondary | ICD-10-CM

## 2022-12-24 DIAGNOSIS — Z79899 Other long term (current) drug therapy: Secondary | ICD-10-CM | POA: Diagnosis not present

## 2022-12-24 DIAGNOSIS — Z888 Allergy status to other drugs, medicaments and biological substances status: Secondary | ICD-10-CM | POA: Diagnosis not present

## 2022-12-24 DIAGNOSIS — D649 Anemia, unspecified: Secondary | ICD-10-CM | POA: Insufficient documentation

## 2022-12-24 DIAGNOSIS — Z87442 Personal history of urinary calculi: Secondary | ICD-10-CM | POA: Diagnosis not present

## 2022-12-24 DIAGNOSIS — I152 Hypertension secondary to endocrine disorders: Secondary | ICD-10-CM | POA: Diagnosis not present

## 2022-12-24 DIAGNOSIS — Z7984 Long term (current) use of oral hypoglycemic drugs: Secondary | ICD-10-CM | POA: Diagnosis not present

## 2022-12-24 DIAGNOSIS — R002 Palpitations: Secondary | ICD-10-CM | POA: Diagnosis not present

## 2022-12-24 DIAGNOSIS — I10 Essential (primary) hypertension: Secondary | ICD-10-CM | POA: Diagnosis not present

## 2022-12-24 DIAGNOSIS — D509 Iron deficiency anemia, unspecified: Secondary | ICD-10-CM | POA: Diagnosis not present

## 2022-12-24 DIAGNOSIS — R000 Tachycardia, unspecified: Secondary | ICD-10-CM | POA: Diagnosis not present

## 2022-12-24 DIAGNOSIS — E7800 Pure hypercholesterolemia, unspecified: Secondary | ICD-10-CM | POA: Diagnosis not present

## 2022-12-24 DIAGNOSIS — F419 Anxiety disorder, unspecified: Secondary | ICD-10-CM | POA: Diagnosis not present

## 2022-12-24 DIAGNOSIS — Z8719 Personal history of other diseases of the digestive system: Secondary | ICD-10-CM | POA: Diagnosis not present

## 2022-12-24 DIAGNOSIS — M1990 Unspecified osteoarthritis, unspecified site: Secondary | ICD-10-CM | POA: Diagnosis not present

## 2022-12-24 DIAGNOSIS — I4891 Unspecified atrial fibrillation: Secondary | ICD-10-CM | POA: Diagnosis not present

## 2022-12-24 DIAGNOSIS — K219 Gastro-esophageal reflux disease without esophagitis: Secondary | ICD-10-CM | POA: Diagnosis not present

## 2022-12-24 DIAGNOSIS — Z7982 Long term (current) use of aspirin: Secondary | ICD-10-CM | POA: Diagnosis not present

## 2022-12-24 MED ORDER — IRON (FERROUS SULFATE) 325 (65 FE) MG PO TABS: 325.0000 mg | ORAL_TABLET | Freq: Every day | ORAL | 2 refills | Status: DC

## 2022-12-24 NOTE — Assessment & Plan Note (Signed)
12 lead EKG shows A-fib with RVR Patient left for the [facility] with ambulance Patient is asymptomatic, denies chest pain and palpitation, no sweating.  Will follow up with the patient post discharge

## 2022-12-24 NOTE — Assessment & Plan Note (Signed)
Taking Metoprolol 25 mg OD Two clinic BP is still high 140/80 Will follow up in 2 weeks as patient agreed to keep a log of BP for two weeks and bring it back to the clinic

## 2022-12-24 NOTE — Assessment & Plan Note (Signed)
Folic acid added Describe iron rich foods and prints out attached to AVS

## 2022-12-24 NOTE — Patient Instructions (Addendum)
Follow up in 1 week   Iron Deficiency Anemia, Adult  Iron deficiency anemia is a condition in which the concentration of red blood cells or hemoglobin in the blood is below normal because of too little iron. Hemoglobin is a substance in red blood cells that carries oxygen to the body's tissues. When the concentration of red blood cells or hemoglobin is too low, not enough oxygen reaches these tissues. Iron deficiency anemia is usually long-lasting, and it develops over time. It may or may not cause symptoms. It is a common type of anemia. What are the causes? This condition may be caused by: Not enough iron in the diet. Abnormal absorption in the gut. Blood loss. What increases the risk? You are more likely to develop this condition if you get menstrual periods (menstruate) or are pregnant. What are the signs or symptoms? Symptoms of this condition may include: Pale skin, lips, and nail beds. Weakness, dizziness, and getting tired easily. Shortness of breath when moving or exercising. Cold hands or feet. Mild anemia may not cause any symptoms. How is this diagnosed? This condition is diagnosed based on: Your medical history. A physical exam. Blood tests. How is this treated? This condition is treated by correcting the cause of your iron deficiency. Treatment may involve: Adding iron-rich foods to your diet. Taking iron supplements. If you are pregnant or breastfeeding, you may need to take extra iron because your normal diet usually does not provide the amount of iron that you need. Increasing vitamin C intake. Vitamin C helps your body absorb iron. Your health care provider may recommend that you take iron supplements along with a glass of orange juice or a vitamin C supplement. Medicines to make heavy menstrual flow lighter. Surgery or additional testing procedures to determine the cause of your anemia. You may need repeat blood tests to determine whether treatment is working. If  the treatment does not seem to be working, you may need more tests. Follow these instructions at home: Medicines Take over-the-counter and prescription medicines only as told by your health care provider. This includes iron supplements and vitamins. This is important because too much iron can be harmful. For the best iron absorption, you should take iron supplements when your stomach is empty. If you cannot tolerate them on an empty stomach, you may need to take them with food. Do not drink milk or take antacids at the same time as your iron supplements. Milk and antacids may interfere with how your body absorbs iron. Iron supplements may turn stool (feces) a darker color and it may appear black. If you cannot tolerate taking iron supplements by mouth, talk with your health care provider about taking them through an IV or through an injection into a muscle. Eating and drinking Talk with your health care provider before changing your diet. Your provider may recommend that you eat foods that contain a lot of iron, such as: Liver. Low-fat (lean) beef. Breads and cereals that have iron added to them (are fortified). Eggs. Dried fruit. Dark green, leafy vegetables. To help your body use the iron from iron-rich foods, eat those foods at the same time as fresh fruits and vegetables that are high in vitamin C. Foods that are high in vitamin C include: Oranges. Peppers. Tomatoes. Mangoes. Managing constipation If you are taking an iron supplement, it may cause constipation. To prevent or treat constipation, you may need to: Drink enough fluid to keep your urine pale yellow. Take over-the-counter or prescription medicines. Eat  foods that are high in fiber, such as beans, whole grains, and fresh fruits and vegetables. Limit foods that are high in fat and processed sugars, such as fried or sweet foods. General instructions Return to your normal activities as told by your health care provider. Ask  your health care provider what activities are safe for you. Keep all follow-up visits. Contact a health care provider if: You feel nauseous or you vomit. You feel weak. You become light-headed when getting up from a sitting or lying down position. You have unexplained sweating. You develop symptoms of constipation. You have a heaviness in your chest. You have trouble breathing with physical activity. Get help right away if: You faint. If this happens, do not drive yourself to the hospital. You have an irregular or rapid heartbeat. Summary Iron deficiency anemia is a condition in which the concentration of red blood cells or hemoglobin in the blood is below normal because of too little iron. This condition is treated by correcting the cause of your iron deficiency. Take over-the-counter and prescription medicines only as told by your health care provider. This includes iron supplements and vitamins. To help your body use the iron from iron-rich foods, eat those foods at the same time as fresh fruits and vegetables that are high in vitamin C. Seek medical help if you have signs or symptoms of worsening anemia. This information is not intended to replace advice given to you by your health care provider. Make sure you discuss any questions you have with your health care provider. Document Revised: 01/21/2022 Document Reviewed: 01/21/2022 Elsevier Patient Education  2023 [employer].

## 2022-12-27 DIAGNOSIS — I10 Essential (primary) hypertension: Secondary | ICD-10-CM | POA: Diagnosis not present

## 2022-12-27 DIAGNOSIS — F32A Depression, unspecified: Secondary | ICD-10-CM | POA: Diagnosis not present

## 2022-12-27 DIAGNOSIS — E1159 Type 2 diabetes mellitus with other circulatory complications: Secondary | ICD-10-CM

## 2022-12-29 ENCOUNTER — Telehealth: Payer: Self-pay

## 2022-12-29 NOTE — Patient Outreach (Signed)
  Care Coordination [facility] Note Transition Care Management Follow-up Telephone Call Date of discharge and from where: 12/25/22 [facility] dx of atrial fibrillation  How have you been since you were released from the hospital? I am feeling a little better but my heart rate  Any questions or concerns? No  Items Reviewed: Did the pt receive and understand the discharge instructions provided? Yes  Medications obtained and verified? Yes  Other? Yes  Reviewed new dx of atrial fib Any new allergies since your discharge? No  Dietary orders reviewed? Yes Do you have support at home? Yes   Home Care and Equipment/Supplies: Were home health services ordered? no If so, what is the name of the agency? N/A  Has the agency set up a time to come to the patient's home? not applicable Were any new equipment or medical supplies ordered?  No What is the name of the medical supply agency? N/A Were you able to get the supplies/equipment? not applicable Do you have any questions related to the use of the equipment or supplies? No  Functional Questionnaire: (I = Independent and D = Dependent) ADLs: I  Bathing/Dressing- I  Meal Prep- I  Eating- I  Maintaining continence- I  Transferring/Ambulation- I  Managing Meds- I  Follow up appointments reviewed:  PCP Hospital f/u appt confirmed? No  Messaged care guide to schedule Specialist Hospital f/u appt confirmed? No  to see cardiology in 2 weeks Are transportation arrangements needed? Yes  If their condition worsens, is the pt aware to call PCP or go to the Emergency Dept.? Yes Was the patient provided with contact information for the PCP's office or ED? Yes Was to pt encouraged to call back with questions or concerns? Yes  SDOH assessments and interventions completed:   Yes   Care Coordination Interventions:  PCP follow up appointment requested   Encounter Outcome:  Pt. Visit Completed   Peter Garter RN, BSN,CCM, CDE Care Management  Coordinator [place] [facility] [phone]

## 2023-01-01 ENCOUNTER — Ambulatory Visit (INDEPENDENT_AMBULATORY_CARE_PROVIDER_SITE_OTHER): Payer: PPO

## 2023-01-01 ENCOUNTER — Encounter: Payer: Self-pay | Admitting: Nurse Practitioner

## 2023-01-01 ENCOUNTER — Ambulatory Visit (INDEPENDENT_AMBULATORY_CARE_PROVIDER_SITE_OTHER): Payer: PPO | Admitting: Nurse Practitioner

## 2023-01-01 ENCOUNTER — Telehealth: Payer: PPO

## 2023-01-01 VITALS — BP 122/70 | HR 85 | Temp 97.2°F | Resp 16 | Ht 63.0 in | Wt 155.6 lb

## 2023-01-01 DIAGNOSIS — E1159 Type 2 diabetes mellitus with other circulatory complications: Secondary | ICD-10-CM

## 2023-01-01 DIAGNOSIS — I152 Hypertension secondary to endocrine disorders: Secondary | ICD-10-CM

## 2023-01-01 DIAGNOSIS — I4891 Unspecified atrial fibrillation: Secondary | ICD-10-CM | POA: Diagnosis not present

## 2023-01-01 DIAGNOSIS — F4321 Adjustment disorder with depressed mood: Secondary | ICD-10-CM

## 2023-01-01 DIAGNOSIS — D509 Iron deficiency anemia, unspecified: Secondary | ICD-10-CM

## 2023-01-01 DIAGNOSIS — F332 Major depressive disorder, recurrent severe without psychotic features: Secondary | ICD-10-CM

## 2023-01-01 DIAGNOSIS — E1121 Type 2 diabetes mellitus with diabetic nephropathy: Secondary | ICD-10-CM

## 2023-01-01 NOTE — Assessment & Plan Note (Signed)
Will check magnesium level today Taking magnesium oxide 400 mg BD

## 2023-01-01 NOTE — Patient Instructions (Signed)
Follow up in 3 months (last week of March) Please follow up with your cardiology  Apixaban Tablets What is this medication? APIXABAN (a PIX a ban) prevents and treats blood clots. It is also used to lower the risk of stroke in people with AFib (atrial fibrillation). It belongs to a group of medications called blood thinners. This medicine may be used for other purposes; ask your health care provider or pharmacist if you have questions. COMMON BRAND NAME(S): Eliquis What should I tell my care team before I take this medication? They need to know if you have any of these conditions: Antiphospholipid antibody syndrome Bleeding disorder History of bleeding in the brain History of blood clots History of stomach bleeding Kidney disease Liver disease Mechanical heart valve Spinal surgery An unusual or allergic reaction to apixaban, other medications, foods, dyes, or preservatives Pregnant or trying to get pregnant Breastfeeding How should I use this medication? Take this medication by mouth. For your therapy to work as well as possible, take each dose exactly as prescribed on the prescription label. Do not skip doses. Skipping doses or stopping this medication can increase your risk of a blood clot or stroke. Keep taking this medication unless your care team tells you to stop. Take it as directed on the prescription label at the same time every day. You can take it with or without food. If it upsets your stomach, take it with food. A special MedGuide will be given to you by the pharmacist with each prescription and refill. Be sure to read this information carefully each time. Talk to your care team about the use of this medication in children. Special care may be needed. Overdosage: If you think you have taken too much of this medicine contact a poison control center or emergency room at once. NOTE: This medicine is only for you. Do not share this medicine with others. What if I miss a dose? If  you miss a dose, take it as soon as you can. If it is almost time for your next dose, take only that dose. Do not take double or extra doses. What may interact with this medication? This medication may interact with the following: Aspirin and aspirin-like medications Certain medications for fungal infections like itraconazole and ketoconazole Certain medications for seizures like carbamazepine and phenytoin Certain medications for blood clots like enoxaparin, dalteparin, heparin, and warfarin Clarithromycin NSAIDs, medications for pain and inflammation, like ibuprofen or naproxen Rifampin Ritonavir St. John's wort This list may not describe all possible interactions. Give your health care provider a list of all the medicines, herbs, non-prescription drugs, or dietary supplements you use. Also tell them if you smoke, drink alcohol, or use illegal drugs. Some items may interact with your medicine. What should I watch for while using this medication? Visit your care team for regular checks on your progress. Your condition will be monitored carefully while you are receiving this medication. You may need blood work while taking this medication. Avoid sports and activities that might cause injury while you are using this medication. Severe falls or injuries can cause unseen bleeding. Be careful when using sharp tools or knives. Consider using an Copy. Take special care brushing or flossing your teeth. Report any injuries, bruising, or red spots on the skin to your care team. If you are going to need surgery or other procedure, tell your care team that you are taking this medication. Wear a medical ID bracelet or chain. Carry a card that describes your  condition. List the medications and doses you take on the card. What side effects may I notice from receiving this medication? Side effects that you should report to your care team as soon as possible: Allergic reactions--skin rash, itching,  hives, swelling of the face, lips, tongue, or throat Bleeding--bloody or black, tar-like stools, vomiting blood or brown material that looks like coffee grounds, red or dark brown urine, small red or purple spots on the skin, unusual bruising or bleeding Bleeding in the brain--severe headache, stiff neck, confusion, dizziness, change in vision, numbness or weakness of the face, arm, or leg, trouble speaking, trouble walking, vomiting Heavy periods This list may not describe all possible side effects. Call your doctor for medical advice about side effects. You may report side effects to FDA at 1-800-FDA-1088. Where should I keep my medication? Keep out of the reach of children and pets. Store at room temperature between 20 and 25 degrees C (68 and 77 degrees F). Get rid of any unused medication after the expiration date. To get rid of medications that are no longer needed or expired: Take the medication to a medication take-back program. Check with your pharmacy or law enforcement to find a location. If you cannot return the medication, check the label or package insert to see if the medication should be thrown out in the garbage or flushed down the toilet. If you are not sure, ask your care team. If it is safe to put in the trash, empty the medication out of the container. Mix the medication with cat litter, dirt, coffee grounds, or other unwanted substance. Seal the mixture in a bag or container. Put it in the trash. NOTE: This sheet is a summary. It may not cover all possible information. If you have questions about this medicine, talk to your doctor, pharmacist, or health care provider.  2023 Elsevier/Gold Standard (2021-01-10 00:00:00)

## 2023-01-01 NOTE — Plan of Care (Signed)
Chronic Care Management Provider Comprehensive Care Plan    01/01/2023- updated Comprehensive Care Plan due to new onset of AFIB seen by Neil Crouch, FNP on 01-01-2023 after hospital discharge  Name: Rachel Hensley MRN: [id number] DOB: [birth year]  12/16/2022- original Comprehensive Care Plan Name: Rachel Hensley        MRN: [id number]       DOB: 1956/01/29   Referral to Chronic Care Management (CCM) services was placed by Provider:  Dr. Rochel Brome on Date: 10-05-2022.   Chronic Condition 1: HTN Provider Assessment and Plan  Has been well controlled.  Unfortunately she is unable to afford her Trulicity, Synjardy XR, [place]. Referred to chronic care management in the hopes that they are able to get her some patient assistance.          Relevant Orders    AMB Referral to Chronic Care Management Services      Expected Outcome/Goals Addressed This Visit (Provider CCM goals/Provider Assessment and plan    CCM Expected Outcome: Monitor, Self-Manage, and Reduce Symptoms of Hypertension    Symptom Management Condition 1: Take all medications as prescribed Attend all scheduled provider appointments Call provider office for new concerns or questions  call the Suicide and Crisis Lifeline: 988 call the [place] National Suicide Prevention Lifeline: 934-542-8892 or TTY: 2340812619 TTY 226 135 3328) to talk to a trained counselor call 1-800-273-TALK (toll free, 24 hour hotline) if experiencing a Mental Health or [place]  check blood pressure weekly learn about high blood pressure call doctor for signs and symptoms of high blood pressure keep all doctor appointments take medications for blood pressure exactly as prescribed report new symptoms to your doctor   Chronic Condition 2: DM Provider Assessment and Plan      Diabetes well controlled on current medications.  Refer to chronic care management in order to help with patient assistance.           Relevant Orders    AMB Referral to Chronic Care Management Services    Hemoglobin A1c              Expected Outcome/Goals Addressed This Visit (Provider CCM goals/Provider Assessment and plan    CCM Expected Outcome:  Monitor, Self-Manage and Reduce Symptoms of Diabetes   Symptom Management Condition 2: Take all medications as prescribed Attend all scheduled provider appointments Call provider office for new concerns or questions  call the Suicide and Crisis Lifeline: 988 call the [place] National Suicide Prevention Lifeline: 702-683-1952 or TTY: 812-492-3909 TTY (626)679-3885) to talk to a trained counselor call 1-800-273-TALK (toll free, 24 hour hotline) if experiencing a Mental Health or [place]  schedule appointment with eye doctor check feet daily for cuts, sores or redness trim toenails straight across manage portion size wash and dry feet carefully every day wear comfortable, cotton socks wear comfortable, well-fitting shoes   Chronic Condition 3: Depression and Grief Provider Assessment and Plan Mood and Affect: Mood normal.        Behavior: Behavior normal.     Expected Outcome/Goals Addressed This Visit (Provider CCM goals/Provider Assessment and plan    CCM Expected Outcome:  Monitor, Self-Manage and Reduce Symptoms of Depression and Grief     Symptom Management Condition 3: Take all medications as prescribed Attend all scheduled provider appointments Call provider office for new concerns or questions  call the Suicide and Crisis Lifeline: 988 call the [place] National Suicide Prevention Lifeline: (937) 808-5789 or TTY: (209)518-9866 [place] 903-807-1558) to talk  to a trained counselor call 1-800-273-TALK (toll free, 24 hour hotline) if experiencing a Mental Health or Behavioral Health Crisis    Chronic Condition 4: AFIB (new onset with hospitalization 12-24-2022) Provider Assessment and Plan   EKG done at clinic shows NSR now Taking Eliquis 5 mg BD  ([facility] added) Cardizem 30 mg q6 AS NEEDED but patient has not taken any dose so far [facility] hospital added) Taking toprol XL 25 mg daily Denies chest pain, palpitation and SHORTNESS OF BREATH Following to cardiologist at 01/19/2023         Relevant Medications    apixaban (ELIQUIS) 5 MG TABS tablet    Expected Outcome/Goals Addressed This Visit (Provider CCM goals/Provider Assessment and plan    CCM Expected Outcome:  Monitor, Self-Manage and Reduce Symptoms of AFIB     Symptom Management Condition 4: Take all medications as prescribed Attend all scheduled provider appointments Call provider office for new concerns or questions  call the Suicide and Crisis Lifeline: 988 call the [place] National Suicide Prevention Lifeline: (754)580-6541 or TTY: 667 690 2398 TTY (469)449-4509) to talk to a trained counselor call 1-800-273-TALK (toll free, 24 hour hotline) if experiencing a Mental Health or Behavioral Health Crisis Check pulse (heart) rate before taking medication Make a plan to eat healthy Keep all lab appointments Take medicine as prescribed  Problem List     Patient Active Problem List    Diagnosis Date Noted   Acute left-sided low back pain without sciatica 12/06/2022   Shortness of breath 12/06/2022   Pedal edema 12/04/2022   UTI (urinary tract infection) 12/04/2022   Atypical chest pain 10/05/2022   Palpitations 10/05/2022   Hypomagnesemia 10/05/2022   Need for immunization against influenza 10/05/2022   ACEI/ARB contraindicated 01/21/2022   Impingement syndrome of right shoulder 10/19/2021   Nodule of skin of right lower leg 10/19/2021   Primary osteoarthritis of left hip 05/20/2020   Lumbar back pain 05/07/2020   Diabetic glomerulopathy (HCC) 02/19/2020   Mixed hyperlipidemia 02/19/2020   Hypertension associated with diabetes (HCC) 02/19/2020   GERD without esophagitis AFIB with RVR 02/19/2020 12-24-2022      Medication Management   Current  Outpatient Medications:    aspirin 81 MG chewable tablet, Chew 81 mg by mouth daily., Disp: , Rfl:    atorvastatin (LIPITOR) 20 MG tablet, TAKE 1 TABLET DAILY, Disp: 90 tablet, Rfl: 1   cetirizine (ZYRTEC) 10 MG tablet, Take 10 mg by mouth daily., Disp: , Rfl:    EPINEPHrine 0.3 mg/0.3 mL IJ SOAJ injection, Inject 0.3 mg into the muscle once., Disp: , Rfl:    furosemide (LASIX) 20 MG tablet, Take 1 tablet (20 mg total) by mouth daily., Disp: 30 tablet, Rfl: 0   glimepiride (AMARYL) 4 MG tablet, Take 4 mg by mouth 2 (two) times daily., Disp: , Rfl:    icosapent Ethyl (VASCEPA) 1 g capsule, Take 2 capsules (2 g total) by mouth 2 (two) times daily., Disp: 360 capsule, Rfl: 1   meloxicam (MOBIC) 15 MG tablet, Take 15 mg by mouth daily. (Patient not taking: Reported on 12/04/2022), Disp: , Rfl:    metFORMIN (GLUCOPHAGE) 1000 MG tablet, Take 1 tablet (1,000 mg total) by mouth 2 (two) times daily with a meal., Disp: 180 tablet, Rfl: 0   metoprolol succinate (TOPROL-XL) 25 MG 24 hr tablet, Take 1 tablet (25 mg total) by mouth daily. Take with or immediately following a meal., Disp: 90 tablet, Rfl: 0   omeprazole (PRILOSEC) 20 MG capsule, Take  1 capsule (20 mg total) by mouth 2 (two) times daily., Disp: 180 capsule, Rfl: 1   ondansetron (ZOFRAN ODT) 4 MG disintegrating tablet, Take 1 tablet (4 mg total) by mouth every 8 (eight) hours as needed for nausea or vomiting. (Patient not taking: Reported on 10/05/2022), Disp: 30 tablet, Rfl: 0   promethazine (PHENERGAN) 25 MG tablet, Take 1 tablet (25 mg total) by mouth every 8 (eight) hours as needed for nausea or vomiting., Disp: 20 tablet, Rfl: 0  New Medications:  Apixaban (Eliquis) 5 mg tablet, take 5 mg by mouth 2 (two) times daily.  Diltiazem (Cardizem) 30 mg, take 30 mg by mouth every 5 (six) hours as needed   Cognitive Assessment Identity Confirmed: : Name; DOB Cognitive Status: Normal     Functional Assessment Hearing Difficulty or Deaf: no Wear  Glasses or Blind: yes Vision Management: wears glasses Concentrating, Remembering or Making Decisions Difficulty (CP): no Difficulty Communicating: no Difficulty Eating/Swallowing: no Walking or Climbing Stairs Difficulty: no Dressing/Bathing Difficulty: no Doing Errands Independently Difficulty (such as shopping) (CP): no Change in Functional Status Since Onset of Current Illness/Injury: no     Caregiver Assessment  Primary Source of Support/Comfort: child(ren) Name of Support/Comfort Primary Source: has 3 sons that live close by if needed. They are a good support for her People in Home: alone Family Caregiver if Needed: child(ren), adult Family Caregiver Names: 3 sons Primary Roles/Responsibilities: retired     Planned Interventions  Evaluation of current treatment plan related to depression and grief and patient's adherence to plan as established by provider. The patient feels she is doing okay right now with her depression and grief. Holidays are always a little hard but she has good support from her sons, grandchildren, and siblings.  Advised patient to call the office for changes in mood, anxiety, depression, or acute changes in mental health Provided education to patient re: about the [employer] and the availability of grief support due to the loss of her husband, mom, brother, and other family members over the last few years. Review of the [facility] program and the resources available Reviewed scheduled/upcoming provider appointments including 12-24-2022 at 9 am Discussed plans with patient for ongoing care management follow up and provided patient with direct contact information for care management team Advised patient to discuss changes in her mental health, depression and grief with provider. Reflective listening and support given Screening for signs and symptoms of depression related to chronic disease state  Assessed social determinant of health barriers Provided education  to patient about basic DM disease process; Reviewed medications with patient and discussed importance of medication adherence. The patient has received Ozempic and is on the 0.25mg  dose. She is doing well with the Ozempic at present. She is working with the pharm D on PAP assistance and ongoing support and education;        Reviewed prescribed diet with patient heart healthy/ADA diet. Review and education. The patient is eating a snack before bed. She states that she is not hungry and sometimes she forgets to eat until it is late. The patient states that her sugar was a little elevated on 12-15-2022 because she ate late; Counseled on importance of regular laboratory monitoring as prescribed. Has regular lab work ;        Discussed plans with patient for ongoing care management follow up and provided patient with direct contact information for care management team;      Provided patient with written educational materials related to hypo and  hyperglycemia and importance of correct treatment. The highest she has seen lately is 180;       Reviewed scheduled/upcoming provider appointments including: 12-04-2022 at 730 am;         Advised patient, providing education and rationale, to check cbg when you have symptoms of low or high blood sugar and as directed   and record. The patient checks her blood sugars. This am her blood sugar was 125      call provider for findings outside established parameters;       Referral made to pharmacy team for assistance with financial difficulties of being able to afford medications for effective management of DM;       Referral made to social work team for assistance with financial barriers, grief and depression. Has been working with the LCSW;      Review of patient status, including review of consultants reports, relevant laboratory and other test results, and medications completed;       Advised patient to discuss changes in her DM health and well being with provider;       Screening for signs and symptoms of depression related to chronic disease state;        Assessed social determinant of health barriers;       Patient to schedule an appointment with the eye doctor after the first of the year. Evaluation of current treatment plan related to hypertension self management and patient's adherence to plan as established by provider. The patient is compliant with the plan of care. Denies any new concerns with HTN;   Provided education to patient re: stroke prevention, s/s of heart attack and stroke; Reviewed prescribed diet heart healthy/ADA diet. The patient is eating healthy. Sometimes eats late due to no appetite from the medications she is taking for her DM. Review of healthy snacks  Reviewed medications with patient and discussed importance of compliance. The patient is compliant with medications. Works with [place] [facility] for medication assistance and support;  Discussed plans with patient for ongoing care management follow up and provided patient with direct contact information for care management team; Advised patient, providing education and rationale, to monitor blood pressure daily and record, calling PCP for findings outside established parameters;  Advised patient to discuss changes in blood pressure or other heart concerns  with provider; Provided education on prescribed diet heart healthy/ADA diet ;  Discussed complications of poorly controlled blood pressure such as heart disease, stroke, circulatory complications, vision complications, kidney impairment, sexual dysfunction;  Next pcp appointment on 12-24-2022 for follow up of bladder infection the patient had at her visit on 12-04-2022 Provider order and care plan reviewed. Patient saw pcp today post hospitalization for AFIB. Was seen in the office on 12-24-2022 when her heart rate went into a rapid AFIB with RVR. She was taken to the hospital and admitted for AFIB with RVR. She feels some of the contributing  factors to the AFIB was low magnesium. She is now taking medications for this also.  She will see the cardiologist on 01-19-2023. She is following the plan of care and discharge instructions Counseled on increased risk of stroke due to Afib and benefits of anticoagulation for stroke prevention           Reviewed importance of adherence to anticoagulant exactly as prescribed Advised patient to discuss changes in her heart rate, changes in her sx and sx, questions and concerns with provider Counseled on bleeding risk associated with AFIB and importance of self-monitoring for  signs/symptoms of bleeding Counseled on avoidance of NSAIDs due to increased bleeding risk with anticoagulants Counseled on importance of regular laboratory monitoring as prescribed Counseled on seeking medical attention after a head injury or if there is blood in the urine/stool Afib action plan reviewed. The patient is taking Eliquis 5 mg QD. She also has Cardizem to take prn. She has parameters to take the Cardizem. She had an episode last night but did not take the Cardizem. States it subsided when she relaxed. She is also taking Magnesium that is helping her as her Magnesium was low.  Screening for signs and symptoms of depression related to chronic disease state Assessed social determinant of health barriers    Interaction and coordination with outside resources, practitioners, and providers See CCM Referral   Care Plan: Available in MyChart

## 2023-01-01 NOTE — Progress Notes (Addendum)
Subjective:  Patient ID: Rachel Hensley, female    DOB: [birth year]  Age: 67 y.o. MRN: [id number]  Chief Complaints:  Follow up after spending one night in [facility] for A-fib with RVR  History of Present illness:  Mr Rachel Hensley arrives to the clinic for the follow up after she was escorted to [facility] at 12/24/2022 from clinic with the EKG findings of A-fib with RVR. In hospital she was treated with IV Cardizem and switched to oral eliquis 5 mg BD and Cardizem 30 mg q6 hours AS NEEDED after she changed to spontaneous NSR (12/25/2022). She was also treated for low magnesium (1.2) with IV magnesium. She is following up with cardiologist 01/19/2023 This time patient feels so better, no any episodes of palpitation that is lasting longer. She is taking her medicines regularly including new added medicine. She thinks everything started just because of low magnesium. She is taking regularly Magnesium 400 mg BD and also She started to take iron pill prescribed from last visit .   Current Outpatient Medications on File Prior to Visit  Medication Sig Dispense Refill   apixaban (ELIQUIS) 5 MG TABS tablet Take 5 mg by mouth 2 (two) times daily.     aspirin 81 MG chewable tablet Chew 81 mg by mouth daily.     atorvastatin (LIPITOR) 20 MG tablet TAKE 1 TABLET DAILY 90 tablet 1   cetirizine (ZYRTEC) 10 MG tablet Take 10 mg by mouth daily.     diltiazem (CARDIZEM) 30 MG tablet Take 30 mg by mouth every 6 (six) hours as needed.     EPINEPHrine 0.3 mg/0.3 mL IJ SOAJ injection Inject 0.3 mg into the muscle once.     furosemide (LASIX) 20 MG tablet Take 1 tablet (20 mg total) by mouth daily. 30 tablet 0   glimepiride (AMARYL) 4 MG tablet Take 4 mg by mouth 2 (two) times daily.     icosapent Ethyl (VASCEPA) 1 g capsule Take 2 capsules (2 g total) by mouth 2 (two) times daily. 360 capsule 1   Iron, Ferrous Sulfate, 325 (65 Fe) MG TABS Take 325 mg by mouth daily. 30 tablet 2   magnesium oxide  (MAG-OX) 400 (240 Mg) MG tablet Take 400 mg by mouth 2 (two) times daily.     meloxicam (MOBIC) 15 MG tablet Take 15 mg by mouth daily.     metFORMIN (GLUCOPHAGE) 1000 MG tablet Take 1 tablet (1,000 mg total) by mouth 2 (two) times daily with a meal. 180 tablet 0   metoprolol succinate (TOPROL-XL) 25 MG 24 hr tablet Take 1 tablet (25 mg total) by mouth daily. Take with or immediately following a meal. 90 tablet 0   omeprazole (PRILOSEC) 20 MG capsule Take 1 capsule (20 mg total) by mouth 2 (two) times daily. 180 capsule 1   ondansetron (ZOFRAN ODT) 4 MG disintegrating tablet Take 1 tablet (4 mg total) by mouth every 8 (eight) hours as needed for nausea or vomiting. 30 tablet 0   promethazine (PHENERGAN) 25 MG tablet Take 1 tablet (25 mg total) by mouth every 8 (eight) hours as needed for nausea or vomiting. 20 tablet 0   No current facility-administered medications on file prior to visit.   Past Medical History:  Diagnosis Date   Angioedema 04/05/2018   Hypoglycemia due to type 2 diabetes mellitus (HCC) 08/21/2020   Mixed hyperlipidemia    Nontoxic multinodular goiter    Other vitamin B12 deficiency anemias    Primary insomnia 05/20/2020  Solitary pulmonary nodule    Type 2 diabetes mellitus with other specified complication [facility])    Past Surgical History:  Procedure Laterality Date   ABDOMINAL HYSTERECTOMY  06/1993   partial; still has both ovaries   HYSTEROTOMY     partial    Family History  Problem Relation Age of Onset   Coronary artery disease Mother    Glaucoma Mother    Colon cancer Father    Diabetes Sister    Atrial fibrillation Sister    Cancer Brother        renal   Kidney cancer Brother    Diabetes type II Brother    Coronary artery disease Brother    Diabetes Brother    Diabetes Brother    Coronary artery disease Brother    Asthma Son        childhood asthma   Arrhythmia Son    Atrial fibrillation Son    Parkinsonism Son    Eczema Grandchild    Atopy  Grandchild    Allergic rhinitis Neg Hx    Angioedema Neg Hx    Immunodeficiency Neg Hx    Urticaria Neg Hx    Breast cancer Neg Hx    Social History   Socioeconomic History   Marital status: Widowed    Spouse name: Not on file   Number of children: 3   Years of education: Not on file   Highest education level: Not on file  Occupational History   Occupation: Sewer  Tobacco Use   Smoking status: Never   Smokeless tobacco: Never  Vaping Use   Vaping Use: Never used  Substance and Sexual Activity   Alcohol use: Never   Drug use: Never   Sexual activity: Not on file  Other Topics Concern   Not on file  Social History Narrative   Not on file   Social Determinants of Health   Financial Resource Strain: Medium Risk (12/16/2022)   Overall Financial Resource Strain (CARDIA)    Difficulty of Paying Living Expenses: Somewhat hard  Food Insecurity: No Food Insecurity (12/16/2022)   Hunger Vital Sign    Worried About Running Out of Food in the Last Year: Never true    Ran Out of Food in the Last Year: Never true  Recent Concern: Food Insecurity - Food Insecurity Present (10/08/2022)   Hunger Vital Sign    Worried About Running Out of Food in the Last Year: Sometimes true    Ran Out of Food in the Last Year: Never true  Transportation Needs: No Transportation Needs (12/29/2022)   PRAPARE - Administrator, Civil Service (Medical): No    Lack of Transportation (Non-Medical): No  Physical Activity: Sufficiently Active (12/16/2022)   Exercise Vital Sign    Days of Exercise per Week: 5 days    Minutes of Exercise per Session: 30 min  Stress: No Stress Concern Present (12/16/2022)   [employer] of Occupational Health - Occupational Stress Questionnaire    Feeling of Stress : Only a little  Social Connections: Moderately Isolated (12/16/2022)   Social Connection and Isolation Panel [NHANES]    Frequency of Communication with Friends and Family: More than three times  a week    Frequency of Social Gatherings with Friends and Family: More than three times a week    Attends Religious Services: More than 4 times per year    Active Member of [employer] or Organizations: No    Attends Banker Meetings: Never  Marital Status: Widowed    Review of Systems  Constitutional:  Negative for chills and fatigue.  HENT:  Negative for congestion, ear pain, sinus pain and sore throat.   Respiratory:  Negative for cough and shortness of breath.   Cardiovascular:  Negative for chest pain and leg swelling.  Gastrointestinal:  Negative for abdominal pain, constipation, diarrhea, nausea and vomiting.  Genitourinary:  Negative for dysuria and frequency.  Musculoskeletal:  Negative for arthralgias, back pain and myalgias.  Neurological:  Negative for dizziness and headaches.     Objective:  BP 122/70   Pulse 85   Temp (!) 97.2 F (36.2 C)   Resp 16   Ht 5\' 3"  (1.6 m)   Wt 155 lb 9.6 oz (70.6 kg)   LMP  (LMP Unknown)   SpO2 99%   BMI 27.56 kg/m      01/01/2023    9:51 AM 12/24/2022    9:33 AM 12/24/2022    8:53 AM  BP/Weight  Systolic BP [phone]  Diastolic BP 70 80 70  Wt. (Lbs) 155.6  160  BMI 27.56 kg/m2  28.34 kg/m2    Physical Exam Vitals reviewed.  Constitutional:      Appearance: Normal appearance. She is normal weight.  Neck:     Vascular: No carotid bruit.  Cardiovascular:     Rate and Rhythm: Normal rate and regular rhythm.     Heart sounds: Normal heart sounds.  Pulmonary:     Effort: Pulmonary effort is normal.     Breath sounds: Normal breath sounds.  Abdominal:     General: Abdomen is flat. Bowel sounds are normal.     Palpations: Abdomen is soft.     Tenderness: There is no abdominal tenderness.  Musculoskeletal:     Cervical back: Normal range of motion.  Neurological:     Mental Status: She is alert and oriented to person, place, and time.  Psychiatric:        Mood and Affect: Mood normal.        Behavior:  Behavior normal.    Lab Results  Component Value Date   WBC 7.5 12/07/2022   HGB 11.0 (L) 12/07/2022   HCT 34.4 12/07/2022   PLT 327 12/07/2022   GLUCOSE 128 (H) 12/07/2022   CHOL 128 12/07/2022   TRIG 141 12/07/2022   HDL 46 12/07/2022   LDLCALC 58 12/07/2022   ALT 14 12/07/2022   AST 13 12/07/2022   NA 143 12/07/2022   K 4.5 12/07/2022   CL 107 (H) 12/07/2022   CREATININE 0.78 12/07/2022   BUN 16 12/07/2022   CO2 23 12/07/2022   TSH 1.280 12/07/2022   HGBA1C 6.9 (H) 12/07/2022   MICROALBUR 150 10/27/2021    Assessment & Plan:   Problem List Items Addressed This Visit       Cardiovascular and Mediastinum   Hypertension associated with diabetes ([place]) (Chronic)    BP getting under-controlled Taking Metoprolol XL 25 mg OD Home log blood pressure under controlled mostly running at 110-120/70-80  Nutrition: Stressed importance of moderation in sodium intake, saturated fat and cholesterol, caloric balance, sufficient intake of complex carbohydrates, fiber, calcium and iron.   Exercise: Stressed the importance of regular exercise.         Relevant Medications   apixaban (ELIQUIS) 5 MG TABS tablet   diltiazem (CARDIZEM) 30 MG tablet   Atrial fibrillation with RVR ([place]) - Primary    EKG done at clinic shows NSR 01/01/2023 Taking  Eliquis 5 mg BD (added by [facility] MD) Cardizem 30 mg q6 AS NEEDED but patient has not taken any dose so far (added by [facility] MD) Taking toprol XL 25 mg daily Denies chest pain, palpitation and SHORTNESS OF BREATH Following to cardiologist at 01/19/2023      Relevant Medications   apixaban (ELIQUIS) 5 MG TABS tablet   diltiazem (CARDIZEM) 30 MG tablet     Other   Hypomagnesemia    Will check magnesium level today Taking magnesium oxide 400 mg BD      Relevant Orders   Magnesium   EKG 12-Lead   Absolute anemia    Continue taking iron supplements and Iron rich foods       Follow-up:  March end for the chronic  check up and labs.  I, Neil Crouch have reviewed all documentation for this visit. The documentation on 01/01/23   for the exam, diagnosis, procedures, and orders are all accurate and complete.      An After Visit Summary was printed and given to the patient.  Neil Crouch, DNP, [place] [phone]

## 2023-01-01 NOTE — Assessment & Plan Note (Signed)
Continue taking iron supplements and Iron rich foods

## 2023-01-01 NOTE — Assessment & Plan Note (Signed)
BP getting under-controlled Taking Metoprolol XL 25 mg OD Home log blood pressure under controlled mostly running at 110-120/70-80  Nutrition: Stressed importance of moderation in sodium intake, saturated fat and cholesterol, caloric balance, sufficient intake of complex carbohydrates, fiber, calcium and iron.   Exercise: Stressed the importance of regular exercise.

## 2023-01-01 NOTE — Chronic Care Management (AMB) (Signed)
Chronic Care Management   CCM RN Visit Note  01/01/2023 Name: Rachel Hensley MRN: [id number] DOB: 04/25/1956  Subjective: Rachel Hensley is a 67 y.o. year old female who is a primary care patient of Cox, Kirsten, MD. The patient was referred to the Chronic Care Management team for assistance with care management needs subsequent to provider initiation of CCM services and plan of care.    Today's Visit:  Engaged with patient by telephone for follow up visit.        Goals Addressed             This Visit's Progress    CCM (DIABETES) EXPECTED OUTCOME: MONITOR, SELF-MANAGE AND REDUCE SYMPTOMS OF Depression and Grief       Current Barriers:  Care Coordination needs related to GriefShare support and resources in a patient with depression and grief Chronic Disease Management support and education needs related to effective management of depression and grief  Planned Interventions: Evaluation of current treatment plan related to depression and grief and patient's adherence to plan as established by provider. The patient feels she is doing okay right now with her depression and grief. Holidays are always a little hard but she has good support from her sons, grandchildren, and siblings. Doing well with her depression and grief. Was recently in the hospital but home now and recovering well.  Advised patient to call the office for changes in mood, anxiety, depression, or acute changes in mental health Provided education to patient re: about the GriefShare and the availability of grief support due to the loss of her husband, mom, brother, and other family members over the last few years. Review of the GriefShare program and the resources available Reviewed scheduled/upcoming provider appointments including 12-24-2022 at 9 am Discussed plans with patient for ongoing care management follow up and provided patient with direct contact information for care management team Advised patient to discuss  changes in her mental health, depression and grief with provider. Reflective listening and support given Screening for signs and symptoms of depression related to chronic disease state  Assessed social determinant of health barriers  Symptom Management: Attend all scheduled provider appointments Attend church or other social activities Work with the social worker to address care coordination needs and will continue to work with the clinical team to address health care and disease management related needs call the Suicide and Crisis Lifeline: 988 call the [place] National Suicide Prevention Lifeline: 920-293-0848 or TTY: (339)073-4093 TTY 5024834494) to talk to a trained counselor call 1-800-273-TALK (toll free, 24 hour hotline) if experiencing a Mental Health or Behavioral Health Crisis   Follow Up Plan: Telephone follow up appointment with care management team member scheduled for: 02-22-2023 at 0900 am       CCM Expected Outcome:  Monitor, Self-Manage and Reduce Symptoms of Afib       Current Barriers:  Knowledge Deficits related to AFIB and recent hospitalization for new onset of AFIB Care Coordination needs related to resources and management of AFIB in a patient with new onset of AFIB with hospitalization for AFIB with RVR Chronic Disease Management support and education needs related to effective management of AFIB  Planned Interventions: Provider order and care plan reviewed. Patient saw pcp today post hospitalization for AFIB. Was seen in the office on 12-24-2022 when her heart rate went into a rapid AFIB with RVR. She was taken to the hospital and admitted for AFIB with RVR. She feels some of the contributing factors to the AFIB  was low magnesium. She is now taking medications for this also.  She will see the cardiologist on 01-19-2023. She is following the plan of care and discharge instructions Counseled on increased risk of stroke due to Afib and benefits of anticoagulation for  stroke prevention           Reviewed importance of adherence to anticoagulant exactly as prescribed Advised patient to discuss changes in her heart rate, changes in her sx and sx, questions and concerns with provider Counseled on bleeding risk associated with AFIB and importance of self-monitoring for signs/symptoms of bleeding Counseled on avoidance of NSAIDs due to increased bleeding risk with anticoagulants Counseled on importance of regular laboratory monitoring as prescribed Counseled on seeking medical attention after a head injury or if there is blood in the urine/stool Afib action plan reviewed. The patient is taking Eliquis 5 mg QD. She also has Cardizem to take prn. She has parameters to take the Cardizem. She had an episode last night but did not take the Cardizem. States it subsided when she relaxed. She is also taking Magnesium that is helping her as her Magnesium was low.  Screening for signs and symptoms of depression related to chronic disease state Assessed social determinant of health barriers  Symptom Management: Take medications as prescribed   Attend all scheduled provider appointments Call provider office for new concerns or questions  call the Suicide and Crisis Lifeline: 988 call the [place] National Suicide Prevention Lifeline: 807-023-1204 or TTY: 586-873-4132 TTY 934-461-4099) to talk to a trained counselor call 1-800-273-TALK (toll free, 24 hour hotline) if experiencing a Mental Health or Behavioral Health Crisis  - check pulse (heart) rate before taking medicine - make a plan to eat healthy - keep all lab appointments - take medicine as prescribed  Follow Up Plan: Telephone follow up appointment with care management team member scheduled for: 02-22-2023 at 0900 am       CCM Expected Outcome:  Monitor, Self-Manage and Reduce Symptoms of Diabetes       Current Barriers:  Care Coordination needs related to loss of job and inability to pay for medications in a  patient with DM Chronic Disease Management support and education needs related to effective management of DM Financial Constraints- working with pharm D for PAP assistance Difficulty obtaining medications  Lab Results  Component Value Date   HGBA1C 6.9 (H) 12/07/2022     Planned Interventions: Provided education to patient about basic DM disease process; Reviewed medications with patient and discussed importance of medication adherence. The patient has received Ozempic and is on the 0.25mg  dose. She is doing well with the Ozempic at present. She is working with the pharm D on PAP assistance and ongoing support and education;        Reviewed prescribed diet with patient heart healthy/ADA diet. Review and education. The patient is eating a snack before bed. She states that she is not hungry and sometimes she forgets to eat until it is late.  Counseled on importance of regular laboratory monitoring as prescribed. Has regular lab work ;        Discussed plans with patient for ongoing care management follow up and provided patient with direct contact information for care management team;      Provided patient with written educational materials related to hypo and hyperglycemia and importance of correct treatment. The highest she has seen lately is 180;       Reviewed scheduled/upcoming provider appointments including: saw pcp today, post discharge  from the hospital for AFIB with RVR. Blood sugars are stable Advised patient, providing education and rationale, to check cbg when you have symptoms of low or high blood sugar and as directed   and record. The patient checks her blood sugars. This am her blood sugar was 125      call provider for findings outside established parameters;       Referral made to pharmacy team for assistance with financial difficulties of being able to afford medications for effective management of DM;       Referral made to social work team for assistance with financial  barriers, grief and depression. Has been working with the LCSW;      Review of patient status, including review of consultants reports, relevant laboratory and other test results, and medications completed;       Advised patient to discuss changes in her DM health and well being with provider;      Screening for signs and symptoms of depression related to chronic disease state;        Assessed social determinant of health barriers;       Patient to schedule an appointment with the eye doctor after the first of the year.  Symptom Management: Take medications as prescribed   Attend all scheduled provider appointments Call pharmacy for medication refills 3-7 days in advance of running out of medications Call provider office for new concerns or questions  Work with the social worker to address care coordination needs and will continue to work with the clinical team to address health care and disease management related needs call the Suicide and Crisis Lifeline: 988 call the [place] National Suicide Prevention Lifeline: 2027661532 or TTY: 250-118-2272 TTY 306-443-3554) to talk to a trained counselor call 1-800-273-TALK (toll free, 24 hour hotline) if experiencing a Mental Health or [place]  schedule appointment with eye doctor check feet daily for cuts, sores or redness trim toenails straight across fill half of plate with vegetables manage portion size prepare main meal at home 3 to 5 days each week wash and dry feet carefully every day wear comfortable, cotton socks wear comfortable, well-fitting shoes  Follow Up Plan: Telephone follow up appointment with care management team member scheduled for: 02-22-2023 at 0900 am       CCM Expected Outcome:  Monitor, Self-Manage, and Reduce Symptoms of Hypertension       Current Barriers:  Chronic Disease Management support and education needs related to effective management of DM Financial Constraints.  BP Readings from Last 3  Encounters:  01/01/23 122/70  12/24/22 (!) 140/80  12/04/22 134/62     Planned Interventions: Evaluation of current treatment plan related to hypertension self management and patient's adherence to plan as established by provider. The patient is compliant with the plan of care. Denies any new concerns with HTN. Does have new diagnosis of AFIB and will follow up with the cardiologist on 01-19-2023. Saw pcp today.;   Provided education to patient re: stroke prevention, s/s of heart attack and stroke; Reviewed prescribed diet heart healthy/ADA diet. The patient is eating healthy. Sometimes eats late due to no appetite from the medications she is taking for her DM. Review of healthy snacks  Reviewed medications with patient and discussed importance of compliance. The patient is compliant with medications. Works with [place] D for medication assistance and support;  Discussed plans with patient for ongoing care management follow up and provided patient with direct contact information for care management team; Advised  patient, providing education and rationale, to monitor blood pressure daily and record, calling PCP for findings outside established parameters;  Advised patient to discuss changes in blood pressure or other heart concerns  with provider; Provided education on prescribed diet heart healthy/ADA diet ;  Discussed complications of poorly controlled blood pressure such as heart disease, stroke, circulatory complications, vision complications, kidney impairment, sexual dysfunction;  Next pcp appointment on 04-09-2023. Saw pcp today for hospital follow up. Will see cardiologist on 01-20-2023.  Symptom Management: Take medications as prescribed   Attend all scheduled provider appointments Call pharmacy for medication refills 3-7 days in advance of running out of medications Call provider office for new concerns or questions  call the Suicide and Crisis Lifeline: 988 call the [place] National Suicide  Prevention Lifeline: 940 383 7350 or TTY: 5076490956 TTY 862-175-7446) to talk to a trained counselor call 1-800-273-TALK (toll free, 24 hour hotline) if experiencing a Mental Health or [place]  check blood pressure weekly learn about high blood pressure call doctor for signs and symptoms of high blood pressure develop an action plan for high blood pressure keep all doctor appointments take medications for blood pressure exactly as prescribed report new symptoms to your doctor  Follow Up Plan: Telephone follow up appointment with care management team member scheduled for: 02-22-2023 at 0900 am          Plan:Telephone follow up appointment with care management team member scheduled for:  02-22-2023 at 0900 am  Noreene Larsson RN, MSN, CCM RN Care Manager  Chronic Care Management Direct Number: [phone]

## 2023-01-01 NOTE — Patient Instructions (Signed)
Please call the care guide team at [phone] if you need to cancel or reschedule your appointment.   If you are experiencing a Mental Health or [place] or need someone to talk to, please call the Suicide and Crisis Lifeline: 988 call the [place] National Suicide Prevention Lifeline: (504)444-9839 or TTY: 678-816-7254 TTY (920) 217-9553) to talk to a trained counselor call 1-800-273-TALK (toll free, 24 hour hotline)   Following is a copy of the CCM Program Consent:  CCM service includes personalized support from designated clinical staff supervised by the physician, including individualized plan of care and coordination with other care providers 24/7 contact phone numbers for assistance for urgent and routine care needs. Service will only be billed when office clinical staff spend 20 minutes or more in a month to coordinate care. Only one practitioner may furnish and bill the service in a calendar month. The patient may stop CCM services at amy time (effective at the end of the month) by phone call to the office staff. The patient will be responsible for cost sharing (co-pay) or up to 20% of the service fee (after annual deductible is met)  Following is a copy of your full provider care plan:   Goals Addressed             This Visit's Progress    CCM (DIABETES) EXPECTED OUTCOME: MONITOR, SELF-MANAGE AND REDUCE SYMPTOMS OF Depression and Grief       Current Barriers:  Care Coordination needs related to [facility] support and resources in a patient with depression and grief Chronic Disease Management support and education needs related to effective management of depression and grief  Planned Interventions: Evaluation of current treatment plan related to depression and grief and patient's adherence to plan as established by provider. The patient feels she is doing okay right now with her depression and grief. Holidays are always a little hard but she has good support from her  sons, grandchildren, and siblings. Doing well with her depression and grief. Was recently in the hospital but home now and recovering well.  Advised patient to call the office for changes in mood, anxiety, depression, or acute changes in mental health Provided education to patient re: about the [place] and the availability of grief support due to the loss of her husband, mom, brother, and other family members over the last few years. Review of the [place] program and the resources available Reviewed scheduled/upcoming provider appointments including 12-24-2022 at 9 am Discussed plans with patient for ongoing care management follow up and provided patient with direct contact information for care management team Advised patient to discuss changes in her mental health, depression and grief with provider. Reflective listening and support given Screening for signs and symptoms of depression related to chronic disease state  Assessed social determinant of health barriers  Symptom Management: Attend all scheduled provider appointments Attend church or other social activities Work with the social worker to address care coordination needs and will continue to work with the clinical team to address health care and disease management related needs call the Suicide and Crisis Lifeline: 988 call the [place] National Suicide Prevention Lifeline: 979 799 9310 or TTY: 602-871-0046 TTY (740) 784-9814) to talk to a trained counselor call 1-800-273-TALK (toll free, 24 hour hotline) if experiencing a Mental Health or [place]   Follow Up Plan: Telephone follow up appointment with care management team member scheduled for: 02-22-2023 at 0900 am       CCM Expected Outcome:  Monitor, Self-Manage and Reduce  Symptoms of Afib       Current Barriers:  Knowledge Deficits related to AFIB and recent hospitalization for new onset of AFIB Care Coordination needs related to resources and management of AFIB  in a patient with new onset of AFIB with hospitalization for AFIB with RVR Chronic Disease Management support and education needs related to effective management of AFIB  Planned Interventions: Provider order and care plan reviewed. Patient saw pcp today post hospitalization for AFIB. Was seen in the office on 12-24-2022 when her heart rate went into a rapid AFIB with RVR. She was taken to the hospital and admitted for AFIB with RVR. She feels some of the contributing factors to the AFIB was low magnesium. She is now taking medications for this also.  She will see the cardiologist on 01-19-2023. She is following the plan of care and discharge instructions Counseled on increased risk of stroke due to Afib and benefits of anticoagulation for stroke prevention           Reviewed importance of adherence to anticoagulant exactly as prescribed Advised patient to discuss changes in her heart rate, changes in her sx and sx, questions and concerns with provider Counseled on bleeding risk associated with AFIB and importance of self-monitoring for signs/symptoms of bleeding Counseled on avoidance of NSAIDs due to increased bleeding risk with anticoagulants Counseled on importance of regular laboratory monitoring as prescribed Counseled on seeking medical attention after a head injury or if there is blood in the urine/stool Afib action plan reviewed. The patient is taking Eliquis 5 mg QD. She also has Cardizem to take prn. She has parameters to take the Cardizem. She had an episode last night but did not take the Cardizem. States it subsided when she relaxed. She is also taking Magnesium that is helping her as her Magnesium was low.  Screening for signs and symptoms of depression related to chronic disease state Assessed social determinant of health barriers  Symptom Management: Take medications as prescribed   Attend all scheduled provider appointments Call provider office for new concerns or questions  call  the Suicide and Crisis Lifeline: 988 call the [place] National Suicide Prevention Lifeline: 704-505-1709 or TTY: 7202550185 TTY 573-831-1431) to talk to a trained counselor call 1-800-273-TALK (toll free, 24 hour hotline) if experiencing a Mental Health or [place]  - check pulse (heart) rate before taking medicine - make a plan to eat healthy - keep all lab appointments - take medicine as prescribed  Follow Up Plan: Telephone follow up appointment with care management team member scheduled for: 02-22-2023 at 0900 am       CCM Expected Outcome:  Monitor, Self-Manage and Reduce Symptoms of Diabetes       Current Barriers:  Care Coordination needs related to loss of job and inability to pay for medications in a patient with DM Chronic Disease Management support and education needs related to effective management of DM Financial Constraints- working with pharm D for PAP assistance Difficulty obtaining medications  Lab Results  Component Value Date   HGBA1C 6.9 (H) 12/07/2022     Planned Interventions: Provided education to patient about basic DM disease process; Reviewed medications with patient and discussed importance of medication adherence. The patient has received Ozempic and is on the 0.25mg  dose. She is doing well with the Ozempic at present. She is working with the pharm D on PAP assistance and ongoing support and education;        Reviewed prescribed diet with patient  heart healthy/ADA diet. Review and education. The patient is eating a snack before bed. She states that she is not hungry and sometimes she forgets to eat until it is late.  Counseled on importance of regular laboratory monitoring as prescribed. Has regular lab work ;        Discussed plans with patient for ongoing care management follow up and provided patient with direct contact information for care management team;      Provided patient with written educational materials related to hypo and  hyperglycemia and importance of correct treatment. The highest she has seen lately is 180;       Reviewed scheduled/upcoming provider appointments including: saw pcp today, post discharge from the hospital for AFIB with RVR. Blood sugars are stable Advised patient, providing education and rationale, to check cbg when you have symptoms of low or high blood sugar and as directed   and record. The patient checks her blood sugars. This am her blood sugar was 125      call provider for findings outside established parameters;       Referral made to pharmacy team for assistance with financial difficulties of being able to afford medications for effective management of DM;       Referral made to social work team for assistance with financial barriers, grief and depression. Has been working with the LCSW;      Review of patient status, including review of consultants reports, relevant laboratory and other test results, and medications completed;       Advised patient to discuss changes in her DM health and well being with provider;      Screening for signs and symptoms of depression related to chronic disease state;        Assessed social determinant of health barriers;       Patient to schedule an appointment with the eye doctor after the first of the year.  Symptom Management: Take medications as prescribed   Attend all scheduled provider appointments Call pharmacy for medication refills 3-7 days in advance of running out of medications Call provider office for new concerns or questions  Work with the social worker to address care coordination needs and will continue to work with the clinical team to address health care and disease management related needs call the Suicide and Crisis Lifeline: 988 call the [place] National Suicide Prevention Lifeline: (223)608-8192 or TTY: 825-742-9532 TTY 606-434-4655) to talk to a trained counselor call 1-800-273-TALK (toll free, 24 hour hotline) if experiencing a  Mental Health or Behavioral Health Crisis  schedule appointment with eye doctor check feet daily for cuts, sores or redness trim toenails straight across fill half of plate with vegetables manage portion size prepare main meal at home 3 to 5 days each week wash and dry feet carefully every day wear comfortable, cotton socks wear comfortable, well-fitting shoes  Follow Up Plan: Telephone follow up appointment with care management team member scheduled for: 02-22-2023 at 0900 am       CCM Expected Outcome:  Monitor, Self-Manage, and Reduce Symptoms of Hypertension       Current Barriers:  Chronic Disease Management support and education needs related to effective management of DM Financial Constraints.  BP Readings from Last 3 Encounters:  01/01/23 122/70  12/24/22 (!) 140/80  12/04/22 134/62     Planned Interventions: Evaluation of current treatment plan related to hypertension self management and patient's adherence to plan as established by provider. The patient is compliant with the plan of  care. Denies any new concerns with HTN. Does have new diagnosis of AFIB and will follow up with the cardiologist on 01-19-2023. Saw pcp today.;   Provided education to patient re: stroke prevention, s/s of heart attack and stroke; Reviewed prescribed diet heart healthy/ADA diet. The patient is eating healthy. Sometimes eats late due to no appetite from the medications she is taking for her DM. Review of healthy snacks  Reviewed medications with patient and discussed importance of compliance. The patient is compliant with medications. Works with pharm D for medication assistance and support;  Discussed plans with patient for ongoing care management follow up and provided patient with direct contact information for care management team; Advised patient, providing education and rationale, to monitor blood pressure daily and record, calling PCP for findings outside established parameters;  Advised  patient to discuss changes in blood pressure or other heart concerns  with provider; Provided education on prescribed diet heart healthy/ADA diet ;  Discussed complications of poorly controlled blood pressure such as heart disease, stroke, circulatory complications, vision complications, kidney impairment, sexual dysfunction;  Next pcp appointment on 04-09-2023. Saw pcp today for hospital follow up. Will see cardiologist on 01-20-2023.  Symptom Management: Take medications as prescribed   Attend all scheduled provider appointments Call pharmacy for medication refills 3-7 days in advance of running out of medications Call provider office for new concerns or questions  call the Suicide and Crisis Lifeline: 988 call the [place] National Suicide Prevention Lifeline: 951-595-3232 or TTY: 928-662-4244 TTY (231)655-9374) to talk to a trained counselor call 1-800-273-TALK (toll free, 24 hour hotline) if experiencing a Mental Health or Behavioral Health Crisis  check blood pressure weekly learn about high blood pressure call doctor for signs and symptoms of high blood pressure develop an action plan for high blood pressure keep all doctor appointments take medications for blood pressure exactly as prescribed report new symptoms to your doctor  Follow Up Plan: Telephone follow up appointment with care management team member scheduled for: 02-22-2023 at 0900 am          Patient verbalizes understanding of instructions and care plan provided today and agrees to view in MyChart. Active MyChart status and patient understanding of how to access instructions and care plan via MyChart confirmed with patient.     Telephone follow up appointment with care management team member scheduled for: 02-22-2023 at 0900 am

## 2023-01-01 NOTE — Assessment & Plan Note (Addendum)
EKG done at clinic shows NSR 01/01/2023 Taking Eliquis 5 mg BD (added by [facility] MD) Cardizem 30 mg q6 AS NEEDED but patient has not taken any dose so far (added by [facility] MD) Taking toprol XL 25 mg daily Denies chest pain, palpitation and SHORTNESS OF BREATH Following to cardiologist at 01/19/2023

## 2023-01-01 NOTE — Addendum Note (Signed)
Addended byNeil Crouch on: 01/01/2023 10:49 AM   Modules accepted: Orders

## 2023-01-02 LAB — MAGNESIUM: Magnesium: 1.7 mg/dL (ref 1.6–2.3)

## 2023-01-04 ENCOUNTER — Other Ambulatory Visit: Payer: Self-pay

## 2023-01-04 DIAGNOSIS — R600 Localized edema: Secondary | ICD-10-CM

## 2023-01-04 MED ORDER — FUROSEMIDE 20 MG PO TABS: 20.0000 mg | ORAL_TABLET | Freq: Every day | ORAL | 1 refills | Status: DC

## 2023-01-04 NOTE — Telephone Encounter (Signed)
Patient is questioning is she needs to stay on this medication or not?

## 2023-01-06 ENCOUNTER — Telehealth: Payer: Self-pay | Admitting: Pharmacy Technician

## 2023-01-06 DIAGNOSIS — Z596 Low income: Secondary | ICD-10-CM

## 2023-01-06 NOTE — Progress Notes (Signed)
[place] [facility])                                            [place] Team    01/06/2023  Rachel Hensley April 25, 1956 [id number]  Care coordination call to [place] in regard to [place] application.  Spoke to [place] who informs patient is APPROVED 01/06/2023-12/28/2023. Patient will need to call [facility] to re order medications and then it will be delivered to her home address.  [name], [place]  [phone]

## 2023-01-07 ENCOUNTER — Encounter: Payer: Self-pay | Admitting: Family Medicine

## 2023-01-19 ENCOUNTER — Ambulatory Visit: Payer: PPO | Attending: Cardiology | Admitting: Cardiology

## 2023-01-19 ENCOUNTER — Encounter: Payer: Self-pay | Admitting: Cardiology

## 2023-01-19 VITALS — BP 138/70 | HR 78 | Ht 63.5 in | Wt 156.6 lb

## 2023-01-19 DIAGNOSIS — R0602 Shortness of breath: Secondary | ICD-10-CM | POA: Diagnosis not present

## 2023-01-19 DIAGNOSIS — R5383 Other fatigue: Secondary | ICD-10-CM | POA: Diagnosis not present

## 2023-01-19 DIAGNOSIS — I152 Hypertension secondary to endocrine disorders: Secondary | ICD-10-CM

## 2023-01-19 DIAGNOSIS — I480 Paroxysmal atrial fibrillation: Secondary | ICD-10-CM

## 2023-01-19 DIAGNOSIS — E1159 Type 2 diabetes mellitus with other circulatory complications: Secondary | ICD-10-CM | POA: Diagnosis not present

## 2023-01-19 DIAGNOSIS — E782 Mixed hyperlipidemia: Secondary | ICD-10-CM

## 2023-01-19 DIAGNOSIS — I4891 Unspecified atrial fibrillation: Secondary | ICD-10-CM | POA: Insufficient documentation

## 2023-01-19 MED ORDER — FLECAINIDE ACETATE 50 MG PO TABS: 50.0000 mg | ORAL_TABLET | Freq: Two times a day (BID) | ORAL | 3 refills | Status: DC

## 2023-01-19 MED ORDER — METOPROLOL SUCCINATE ER 50 MG PO TB24: 50.0000 mg | ORAL_TABLET | Freq: Every day | ORAL | 3 refills | Status: DC

## 2023-01-19 NOTE — Patient Instructions (Addendum)
Medication Instructions:  INCREASE: Metoprolol from 25 to 50 mg daily x 3 days  if her palpitations are not better we need to put her on   START: Flecainide 50 mg twice daily so sent this medication to pharmacy saying 3 days if she is not feeling better she need to keep taking metoprolol and flecainide.  3 to 4 days later she is to come back to our office to have EKG done    Lab Work: BMP, Magnesium, TSH- Today If you have labs (blood work) drawn today and your tests are completely normal, you will receive your results only by: [place] (if you have MyChart) OR A paper copy in the mail If you have any lab test that is abnormal or we need to change your treatment, we will call you to review the results.   Testing/Procedures: None Ordered   Follow-Up: At [facility], you and your health needs are our priority.  As part of our continuing mission to provide you with exceptional heart care, we have created designated Provider Care Teams.  These Care Teams include your primary Cardiologist (physician) and Advanced Practice Providers (APPs -  Physician Assistants and Nurse Practitioners) who all work together to provide you with the care you need, when you need it.  We recommend signing up for the patient portal called "MyChart".  Sign up information is provided on this After Visit Summary.  MyChart is used to connect with patients for Virtual Visits (Telemedicine).  Patients are able to view lab/test results, encounter notes, upcoming appointments, etc.  Non-urgent messages can be sent to your provider as well.   To learn more about what you can do with MyChart, go to [email or web address].    Your next appointment:   1 month(s)  The format for your next appointment:   In Person  Provider:   Jenne Campus, MD    Other Instructions Call office to schedule Nurse visit after starting Flecainide 50mg  twice daily- 3-4 days after starting Flecainide

## 2023-01-19 NOTE — Progress Notes (Signed)
Cardiology Consultation:    Date:  01/19/2023   ID:  Rachel Hensley, Rachel Hensley 1956-07-22, MRN [id number]  PCP:  Blane Ohara, MD  Cardiologist:  Gypsy Balsam, MD   Referring MD: Blane Ohara, MD   Chief Complaint  Patient presents with   Atrial Fibrillation    Seen at [facility] 11/2022    History of Present Illness:    Rachel Hensley is a 67 y.o. female who is being seen today for the evaluation of paroxysmal atrial fibrillation at the request of Cox, Kirsten, MD. past medical history significant for essential hypertension, dyslipidemia, she has been complaining of having palpitations as well as atypical chest pain.  Fasting hospitalization in September reveal negative biochemical markers, stress test was negative for ischemia, echocardiogram showed preserved left ventricle ejection fraction with normal biatrial size.  Since that time she has been having some palpitations, she did wear a monitor to have a monitor only showed supraventricular tachycardia, there were no evidence of atrial fibrillation.  After that couple weeks later she presented to her primary care physician office she was found to have palpitations EKG revealed atrial fibrillation fast ventricular rate, she was admitted to the hospital.  She was put on Cardizem as well as anticoagulation, was converted to sinus rhythm with that medical setting.  She is coming today to my office to discuss that. Since the time of admission to the hospital she still complain of having some palpitations she brought me blood pressure measurements which usually is good however heart rate that days that heart rate will be between 1 20-1 30.  She said usually when it happens she feels poorly.  She denies have any chest pain tightness squeezing pressure burning chest chest palpitations.  No dizziness no passing out.  She does have some fatigue and tiredness.  She does not smoke never did, does have family history of coronary disease but not premature.   She is not on any special diet.  Past Medical History:  Diagnosis Date   Angioedema 04/05/2018   Hypoglycemia due to type 2 diabetes mellitus (HCC) 08/21/2020   Mixed hyperlipidemia    Nontoxic multinodular goiter    Other vitamin B12 deficiency anemias    Primary insomnia 05/20/2020   Solitary pulmonary nodule    Type 2 diabetes mellitus with other specified complication [facility])     Past Surgical History:  Procedure Laterality Date   ABDOMINAL HYSTERECTOMY  06/1993   partial; still has both ovaries   HYSTEROTOMY     partial    Current Medications: Current Meds  Medication Sig   apixaban (ELIQUIS) 5 MG TABS tablet Take 5 mg by mouth 2 (two) times daily.   APPLE CIDER VINEGAR PO Take 1 tablet by mouth in the morning and at bedtime.   aspirin 81 MG chewable tablet Chew 81 mg by mouth daily.   atorvastatin (LIPITOR) 20 MG tablet TAKE 1 TABLET DAILY (Patient taking differently: Take 20 mg by mouth daily.)   cetirizine (ZYRTEC) 10 MG tablet Take 10 mg by mouth daily.   diltiazem (CARDIZEM) 30 MG tablet Take 30 mg by mouth every 6 (six) hours as needed.   EPINEPHrine 0.3 mg/0.3 mL IJ SOAJ injection Inject 0.3 mg into the muscle once.   Finerenone (KERENDIA) 10 MG TABS Take 1 tablet by mouth daily.   flecainide (TAMBOCOR) 50 MG tablet Take 1 tablet (50 mg total) by mouth 2 (two) times daily.   furosemide (LASIX) 20 MG tablet Take 1 tablet (20  mg total) by mouth daily.   glimepiride (AMARYL) 4 MG tablet Take 4 mg by mouth 2 (two) times daily.   icosapent Ethyl (VASCEPA) 1 g capsule Take 2 capsules (2 g total) by mouth 2 (two) times daily.   Iron, Ferrous Sulfate, 325 (65 Fe) MG TABS Take 325 mg by mouth daily.   magnesium oxide (MAG-OX) 400 (240 Mg) MG tablet Take 400 mg by mouth 2 (two) times daily.   meloxicam (MOBIC) 15 MG tablet Take 15 mg by mouth daily.   metFORMIN (GLUCOPHAGE) 1000 MG tablet Take 1 tablet (1,000 mg total) by mouth 2 (two) times daily with a meal.   metoprolol  succinate (TOPROL-XL) 50 MG 24 hr tablet Take 1 tablet (50 mg total) by mouth daily. Take with or immediately following a meal.   omeprazole (PRILOSEC) 20 MG capsule Take 1 capsule (20 mg total) by mouth 2 (two) times daily.   ondansetron (ZOFRAN ODT) 4 MG disintegrating tablet Take 1 tablet (4 mg total) by mouth every 8 (eight) hours as needed for nausea or vomiting.   promethazine (PHENERGAN) 25 MG tablet Take 1 tablet (25 mg total) by mouth every 8 (eight) hours as needed for nausea or vomiting.   Semaglutide,0.25 or 0.5MG /DOS, (OZEMPIC, 0.25 OR 0.5 MG/DOSE,) 2 MG/1.5ML SOPN Inject 0.5 mg into the skin once a week.   [DISCONTINUED] metoprolol succinate (TOPROL-XL) 25 MG 24 hr tablet Take 1 tablet (25 mg total) by mouth daily. Take with or immediately following a meal.     Allergies:   Actos [pioglitazone], Fenofibrate, Lisinopril, and Prednisone   Social History   Socioeconomic History   Marital status: Widowed    Spouse name: Not on file   Number of children: 3   Years of education: Not on file   Highest education level: Not on file  Occupational History   Occupation: Sewer  Tobacco Use   Smoking status: Never   Smokeless tobacco: Never  Vaping Use   Vaping Use: Never used  Substance and Sexual Activity   Alcohol use: Never   Drug use: Never   Sexual activity: Not on file  Other Topics Concern   Not on file  Social History Narrative   Not on file   Social Determinants of Health   Financial Resource Strain: Medium Risk (12/16/2022)   Overall Financial Resource Strain (CARDIA)    Difficulty of Paying Living Expenses: Somewhat hard  Food Insecurity: No Food Insecurity (12/16/2022)   Hunger Vital Sign    Worried About Running Out of Food in the Last Year: Never true    Ran Out of Food in the Last Year: Never true  Recent Concern: Food Insecurity - Food Insecurity Present (10/08/2022)   Hunger Vital Sign    Worried About Running Out of Food in the Last Year: Sometimes true     Ran Out of Food in the Last Year: Never true  Transportation Needs: No Transportation Needs (12/29/2022)   PRAPARE - Hydrologist (Medical): No    Lack of Transportation (Non-Medical): No  Physical Activity: Sufficiently Active (12/16/2022)   Exercise Vital Sign    Days of Exercise per Week: 5 days    Minutes of Exercise per Session: 30 min  Stress: No Stress Concern Present (12/16/2022)   [place]    Feeling of Stress : Only a little  Social Connections: Moderately Isolated (12/16/2022)   Social Connection and Isolation Panel [NHANES]  Frequency of Communication with Friends and Family: More than three times a week    Frequency of Social Gatherings with Friends and Family: More than three times a week    Attends Religious Services: More than 4 times per year    Active Member of [employer] or Organizations: No    Attends Archivist Meetings: Never    Marital Status: Widowed     Family History: The patient's family history includes Arrhythmia in her son; Asthma in her son; Atopy in her grandchild; Atrial fibrillation in her sister and son; Cancer in her brother; Colon cancer in her father; Coronary artery disease in her brother, brother, and mother; Diabetes in her brother, brother, and sister; Diabetes type II in her brother; Eczema in her grandchild; Glaucoma in her mother; Kidney cancer in her brother; Parkinsonism in her son. There is no history of Allergic rhinitis, Angioedema, Immunodeficiency, Urticaria, or Breast cancer. ROS:   Please see the history of present illness.    All 14 point review of systems negative except as described per history of present illness.  EKGs/Labs/Other Studies Reviewed:    The following studies were reviewed today: Record from hospital reviewed.  Stress test done in September showing no evidence of ischemia.  Echocardiogram done at the same time  showed normal left ventricle ejection fraction normal left atrial size, no significant valvular pathology  EKG:  EKG is  ordered today.  The ekg ordered today demonstrates EKG showed normal sinus rhythm normal P interval normal QS complex duration fulgent no ST segment changes  Recent Labs: 12/07/2022: ALT 14; BUN 16; Creatinine, Ser 0.78; Hemoglobin 11.0; NT-Pro BNP 285; Platelets 327; Potassium 4.5; Sodium 143; TSH 1.280 01/01/2023: Magnesium 1.7  Recent Lipid Panel    Component Value Date/Time   CHOL 128 12/07/2022 0813   TRIG 141 12/07/2022 0813   HDL 46 12/07/2022 0813   CHOLHDL 2.8 12/07/2022 0813   LDLCALC 58 12/07/2022 0813    Physical Exam:    VS:  BP 138/70 (BP Location: Left Arm, Patient Position: Sitting)   Pulse 78   Ht 5' 3.5" (1.613 m)   Wt 156 lb 9.6 oz (71 kg)   LMP  (LMP Unknown)   SpO2 99%   BMI 27.31 kg/m     Wt Readings from Last 3 Encounters:  01/19/23 156 lb 9.6 oz (71 kg)  01/01/23 155 lb 9.6 oz (70.6 kg)  12/24/22 160 lb (72.6 kg)     GEN:  Well nourished, well developed in no acute distress HEENT: Normal NECK: No JVD; No carotid bruits LYMPHATICS: No lymphadenopathy CARDIAC: RRR, no murmurs, no rubs, no gallops RESPIRATORY:  Clear to auscultation without rales, wheezing or rhonchi  ABDOMEN: Soft, non-tender, non-distended MUSCULOSKELETAL:  No edema; No deformity  SKIN: Warm and dry NEUROLOGIC:  Alert and oriented x 3 PSYCHIATRIC:  Normal affect   ASSESSMENT:    1. Atrial fibrillation with RVR ([place])   2. Hypertension associated with diabetes ([place])   3. Shortness of breath   4. Fatigue, unspecified type   5. Paroxysmal atrial fibrillation (HCC)   6. Mixed hyperlipidemia    PLAN:    In order of problems listed above:  Paroxysmal atrial fibrillation will be bothering her a lot.  She is taking Eliquis which I will continue, her CHADS2 vascular equals 4.  I will ask her to increase dose of metoprolol succinate she takes 25 mg we will go up  to 50 mg daily.  I will also send  her prescription for flecainide 50 mg twice daily.  I think it safe to use this medication since we have recent stress test as well as echocardiogram which both were normal.  I told her to wait 3 to 4 days after doubling the dose of metoprolol and then start taking flecainide 50 mg twice daily.  I also asked her to come to my office couple days after she started taking flecainide for a repeated EKG.  In terms of etiology of this phenomenon is unclear for the moment.  She said she snores very little, I do see some TSH which will be on the lower side so we will recheck it, she also tell me that previously her magnesium potassium was low and she has been given supplementation so I will check it. Essential hypertension, blood pressure appears to be well-controlled she brought results to me from home and well-controlled continue present management. Mixed dyslipidemia I did review her K PN I see her LDL of 58 HDL 46 is a good cholesterol profile this is from December of last year, continue present management   Medication Adjustments/Labs and Tests Ordered: Current medicines are reviewed at length with the patient today.  Concerns regarding medicines are outlined above.  Orders Placed This Encounter  Procedures   Basic metabolic panel   TSH   Magnesium   EKG 12-Lead   Meds ordered this encounter  Medications   metoprolol succinate (TOPROL-XL) 50 MG 24 hr tablet    Sig: Take 1 tablet (50 mg total) by mouth daily. Take with or immediately following a meal.    Dispense:  90 tablet    Refill:  3   flecainide (TAMBOCOR) 50 MG tablet    Sig: Take 1 tablet (50 mg total) by mouth 2 (two) times daily.    Dispense:  180 tablet    Refill:  3    Signed, Park Liter, MD, [facility]. 01/19/2023 11:38 AM    [place]

## 2023-01-20 LAB — BASIC METABOLIC PANEL
BUN/Creatinine Ratio: 30 — ABNORMAL HIGH (ref 12–28)
BUN: 23 mg/dL (ref 8–27)
CO2: 22 mmol/L (ref 20–29)
Calcium: 10.2 mg/dL (ref 8.7–10.3)
Chloride: 100 mmol/L (ref 96–106)
Creatinine, Ser: 0.77 mg/dL (ref 0.57–1.00)
Glucose: 138 mg/dL — ABNORMAL HIGH (ref 70–99)
Potassium: 4.9 mmol/L (ref 3.5–5.2)
Sodium: 141 mmol/L (ref 134–144)
eGFR: 84 mL/min/{1.73_m2} (ref >59)

## 2023-01-20 LAB — TSH: TSH: 0.777 u[IU]/mL (ref 0.450–4.500)

## 2023-01-20 LAB — MAGNESIUM: Magnesium: 1.5 mg/dL — ABNORMAL LOW (ref 1.6–2.3)

## 2023-01-25 ENCOUNTER — Ambulatory Visit: Payer: PPO | Attending: Cardiology

## 2023-01-25 ENCOUNTER — Other Ambulatory Visit: Payer: Self-pay

## 2023-01-25 VITALS — BP 132/70 | HR 74

## 2023-01-25 DIAGNOSIS — I480 Paroxysmal atrial fibrillation: Secondary | ICD-10-CM

## 2023-01-25 DIAGNOSIS — I4891 Unspecified atrial fibrillation: Secondary | ICD-10-CM

## 2023-01-25 MED ORDER — APIXABAN 5 MG PO TABS: 5.0000 mg | ORAL_TABLET | Freq: Two times a day (BID) | ORAL | 1 refills | Status: DC

## 2023-01-25 NOTE — Progress Notes (Signed)
   Nurse Visit   Date of Encounter: 01/26/2023 ID: Rachel Hensley, DOB 1956-11-17, MRN [id number]  PCP:  Rochel Brome, MD   [place] Providers Cardiologist:  None      Visit Details   VS:  BP 132/70 (BP Location: Right Arm, Patient Position: Sitting, Cuff Size: Normal)   Pulse 74   LMP  (LMP Unknown)  , BMI There is no height or weight on file to calculate BMI.  Wt Readings from Last 3 Encounters:  01/19/23 156 lb 9.6 oz (71 kg)  01/01/23 155 lb 9.6 oz (70.6 kg)  12/24/22 160 lb (72.6 kg)     Reason for visit: perform EKG patient recently started on flecanide Performed today: EKG, Vitals, Provider consulted and Education Changes (medications, testing, etc.) : No changes at this time Length of Visit: 25 minutes    Medications Adjustments/Labs and Tests Ordered: Orders Placed This Encounter  Procedures   EKG 12-Lead   No orders of the defined types were placed in this encounter.    Signed, Louie Casa, RN  01/26/2023 12:27 PM

## 2023-01-27 DIAGNOSIS — E1159 Type 2 diabetes mellitus with other circulatory complications: Secondary | ICD-10-CM

## 2023-01-27 DIAGNOSIS — F32A Depression, unspecified: Secondary | ICD-10-CM

## 2023-01-27 DIAGNOSIS — I4891 Unspecified atrial fibrillation: Secondary | ICD-10-CM

## 2023-01-27 DIAGNOSIS — I10 Essential (primary) hypertension: Secondary | ICD-10-CM

## 2023-01-28 ENCOUNTER — Telehealth: Payer: Self-pay

## 2023-01-28 NOTE — Telephone Encounter (Signed)
Results reviewed with pt as per Dr. Wendy Poet note. Double dose of Magnesium. Mag level in 1 week.  Pt verbalized understanding and had no additional questions. Routed to PCP

## 2023-02-02 ENCOUNTER — Other Ambulatory Visit (HOSPITAL_COMMUNITY): Payer: Self-pay

## 2023-02-03 ENCOUNTER — Telehealth: Payer: Self-pay

## 2023-02-03 NOTE — Progress Notes (Cosign Needed)
[place] notification:  Patient approved to receive Ozempic through 12/23/2023 as of January 8th, 2024.   Pattricia Boss, [place] Pharmacist Assistant [phone]

## 2023-02-04 ENCOUNTER — Telehealth: Payer: Self-pay | Admitting: Pharmacy Technician

## 2023-02-04 DIAGNOSIS — Z596 Low income: Secondary | ICD-10-CM

## 2023-02-04 NOTE — Progress Notes (Signed)
[facility])                                            [place] Team    02/04/2023  [name] October 01, 1956 [id number]  Care coordination call placed to [place] in regard to [place] application.  Spoke to [place] who informs patient is APPROVED 12/28/22-12/28/23. Medication will auto fill and ship to prescriber's address based on last fill in 2023. If patient feels as though supply is not sufficient to last until next refill, patient may call [employer] at [phone].  Kameren Pargas P. Ijeoma Loor, [place]  [phone]

## 2023-02-12 ENCOUNTER — Telehealth: Payer: Self-pay

## 2023-02-12 NOTE — Telephone Encounter (Signed)
Magnesium level ordered.

## 2023-02-13 LAB — MAGNESIUM: Magnesium: 1.8 mg/dL (ref 1.6–2.3)

## 2023-02-17 ENCOUNTER — Telehealth: Payer: Self-pay

## 2023-02-17 NOTE — Telephone Encounter (Signed)
Called Patient made aware that Ozempic is here for pick up.

## 2023-02-18 ENCOUNTER — Telehealth: Payer: Self-pay

## 2023-02-18 NOTE — Telephone Encounter (Signed)
Results reviewed with pt as per Dr. Krasowski's note.  Pt verbalized understanding and had no additional questions. Routed to PCP  

## 2023-02-19 ENCOUNTER — Other Ambulatory Visit: Payer: Self-pay

## 2023-02-19 ENCOUNTER — Ambulatory Visit: Payer: PPO | Attending: Cardiology | Admitting: Cardiology

## 2023-02-19 DIAGNOSIS — E1159 Type 2 diabetes mellitus with other circulatory complications: Secondary | ICD-10-CM | POA: Diagnosis not present

## 2023-02-19 DIAGNOSIS — I480 Paroxysmal atrial fibrillation: Secondary | ICD-10-CM

## 2023-02-19 DIAGNOSIS — I4891 Unspecified atrial fibrillation: Secondary | ICD-10-CM | POA: Diagnosis not present

## 2023-02-19 DIAGNOSIS — I152 Hypertension secondary to endocrine disorders: Secondary | ICD-10-CM

## 2023-02-19 MED ORDER — METFORMIN HCL 1000 MG PO TABS: 1000.0000 mg | ORAL_TABLET | Freq: Two times a day (BID) | ORAL | 0 refills | Status: DC

## 2023-02-19 MED ORDER — ICOSAPENT ETHYL 1 G PO CAPS: 2.0000 g | ORAL_CAPSULE | Freq: Two times a day (BID) | ORAL | 1 refills | Status: DC

## 2023-02-19 NOTE — Patient Instructions (Addendum)
Medication Instructions:  Your physician recommends that you continue on your current medications as directed. Please refer to the Current Medication list given to you today.  *If you need a refill on your cardiac medications before your next appointment, please call your pharmacy*   Lab Work: BMP, Magnesium- today If you have labs (blood work) drawn today and your tests are completely normal, you will receive your results only by: [place] (if you have MyChart) OR A paper copy in the mail If you have any lab test that is abnormal or we need to change your treatment, we will call you to review the results.   Testing/Procedures: None Ordered   Follow-Up: At [facility], you and your health needs are our priority.  As part of our continuing mission to provide you with exceptional heart care, we have created designated Provider Care Teams.  These Care Teams include your primary Cardiologist (physician) and Advanced Practice Providers (APPs -  Physician Assistants and Nurse Practitioners) who all work together to provide you with the care you need, when you need it.  We recommend signing up for the patient portal called "MyChart".  Sign up information is provided on this After Visit Summary.  MyChart is used to connect with patients for Virtual Visits (Telemedicine).  Patients are able to view lab/test results, encounter notes, upcoming appointments, etc.  Non-urgent messages can be sent to your provider as well.   To learn more about what you can do with MyChart, go to [email or web address].    Your next appointment:   6 month(s)  The format for your next appointment:   In Person  Provider:   Jenne Campus, MD    Other Instructions NA

## 2023-02-19 NOTE — Telephone Encounter (Signed)
Patient picked PAP (Ozempic)

## 2023-02-19 NOTE — Telephone Encounter (Signed)
Patient needs refill on glimepiride I seen in the note where you stated to keep taking it until she get Trulicity patient is on Ozempic do you still want her taking glimepiride?

## 2023-02-19 NOTE — Progress Notes (Signed)
Cardiology Office Note:    Date:  02/19/2023   ID:  Rachel Hensley, DOB 1956/11/29, MRN [id number]  PCP:  Rachel Brome, MD  Cardiologist:  Jenne Campus, MD    Referring MD: Rachel Brome, MD   Chief Complaint  Patient presents with   Follow-up  Doing well  History of Present Illness:    Rachel Hensley is a 67 y.o. female with past medical history significant for paroxysmal atrial fibrillation recurrent episodes to the point that we elected to pursue rhythm control strategy with flecainide.  Stress test has been done which was negative echocardiogram showed preserved left ventricular ejection fraction, flecainide 50 mg twice daily has been initiated she is doing very well from that point review.  Denies have any palpitations since that time.  Her biggest concern is the fact that she required a lot of magnesium to get her magnesium at the appropriate level.  Past Medical History:  Diagnosis Date   Angioedema 04/05/2018   Hypoglycemia due to type 2 diabetes mellitus ([place]) 08/21/2020   Mixed hyperlipidemia    Nontoxic multinodular goiter    Other vitamin B12 deficiency anemias    Primary insomnia 05/20/2020   Solitary pulmonary nodule    Type 2 diabetes mellitus with other specified complication [facility])     Past Surgical History:  Procedure Laterality Date   ABDOMINAL HYSTERECTOMY  06/1993   partial; still has both ovaries   HYSTEROTOMY     partial    Current Medications: Current Meds  Medication Sig   apixaban (ELIQUIS) 5 MG TABS tablet Take 1 tablet (5 mg total) by mouth 2 (two) times daily.   APPLE CIDER VINEGAR PO Take 1 tablet by mouth in the morning and at bedtime.   atorvastatin (LIPITOR) 20 MG tablet TAKE 1 TABLET DAILY (Patient taking differently: Take 20 mg by mouth daily.)   cetirizine (ZYRTEC) 10 MG tablet Take 10 mg by mouth daily.   diltiazem (CARDIZEM) 30 MG tablet Take 30 mg by mouth every 6 (six) hours as needed.   EPINEPHrine 0.3 mg/0.3 mL IJ SOAJ  injection Inject 0.3 mg into the muscle once.   Finerenone (KERENDIA) 10 MG TABS Take 1 tablet by mouth daily.   flecainide (TAMBOCOR) 50 MG tablet Take 1 tablet (50 mg total) by mouth 2 (two) times daily.   furosemide (LASIX) 20 MG tablet Take 1 tablet (20 mg total) by mouth daily.   glimepiride (AMARYL) 4 MG tablet Take 4 mg by mouth 2 (two) times daily.   icosapent Ethyl (VASCEPA) 1 g capsule Take 2 capsules (2 g total) by mouth 2 (two) times daily.   Iron, Ferrous Sulfate, 325 (65 Fe) MG TABS Take 325 mg by mouth daily.   magnesium oxide (MAG-OX) 400 (240 Mg) MG tablet Take 800 mg by mouth 2 (two) times daily.   metFORMIN (GLUCOPHAGE) 1000 MG tablet Take 1 tablet (1,000 mg total) by mouth 2 (two) times daily with a meal.   metoprolol succinate (TOPROL-XL) 50 MG 24 hr tablet Take 1 tablet (50 mg total) by mouth daily. Take with or immediately following a meal.   omeprazole (PRILOSEC) 20 MG capsule Take 1 capsule (20 mg total) by mouth 2 (two) times daily.   ondansetron (ZOFRAN ODT) 4 MG disintegrating tablet Take 1 tablet (4 mg total) by mouth every 8 (eight) hours as needed for nausea or vomiting.     Allergies:   Actos [pioglitazone], Fenofibrate, Lisinopril, and Prednisone   Social History   Socioeconomic  History   Marital status: Widowed    Spouse name: Not on file   Number of children: 3   Years of education: Not on file   Highest education level: Not on file  Occupational History   Occupation: Sewer  Tobacco Use   Smoking status: Never   Smokeless tobacco: Never  Vaping Use   Vaping Use: Never used  Substance and Sexual Activity   Alcohol use: Never   Drug use: Never   Sexual activity: Not on file  Other Topics Concern   Not on file  Social History Narrative   Not on file   Social Determinants of Health   Financial Resource Strain: Medium Risk (12/16/2022)   Overall Financial Resource Strain (CARDIA)    Difficulty of Paying Living Expenses: Somewhat hard  Food  Insecurity: No Food Insecurity (12/16/2022)   Hunger Vital Sign    Worried About Running Out of Food in the Last Year: Never true    Ran Out of Food in the Last Year: Never true  Recent Concern: Food Insecurity - Food Insecurity Present (10/08/2022)   Hunger Vital Sign    Worried About Running Out of Food in the Last Year: Sometimes true    Ran Out of Food in the Last Year: Never true  Transportation Needs: No Transportation Needs (12/29/2022)   PRAPARE - Hydrologist (Medical): No    Lack of Transportation (Non-Medical): No  Physical Activity: Sufficiently Active (12/16/2022)   Exercise Vital Sign    Days of Exercise per Week: 5 days    Minutes of Exercise per Session: 30 min  Stress: No Stress Concern Present (12/16/2022)   [place]    Feeling of Stress : Only a little  Social Connections: Moderately Isolated (12/16/2022)   Social Connection and Isolation Panel [NHANES]    Frequency of Communication with Friends and Family: More than three times a week    Frequency of Social Gatherings with Friends and Family: More than three times a week    Attends Religious Services: More than 4 times per year    Active Member of [employer] or Organizations: No    Attends Archivist Meetings: Never    Marital Status: Widowed     Family History: The patient's family history includes Arrhythmia in her son; Asthma in her son; Atopy in her grandchild; Atrial fibrillation in her sister and son; Cancer in her brother; Colon cancer in her father; Coronary artery disease in her brother, brother, and mother; Diabetes in her brother, brother, and sister; Diabetes type II in her brother; Eczema in her grandchild; Glaucoma in her mother; Kidney cancer in her brother; Parkinsonism in her son. There is no history of Allergic rhinitis, Angioedema, Immunodeficiency, Urticaria, or Breast cancer. ROS:   Please see the  history of present illness.    All 14 point review of systems negative except as described per history of present illness  EKGs/Labs/Other Studies Reviewed:      Recent Labs: 12/07/2022: ALT 14; Hemoglobin 11.0; NT-Pro BNP 285; Platelets 327 01/19/2023: BUN 23; Creatinine, Ser 0.77; Potassium 4.9; Sodium 141; TSH 0.777 02/12/2023: Magnesium 1.8  Recent Lipid Panel    Component Value Date/Time   CHOL 128 12/07/2022 0813   TRIG 141 12/07/2022 0813   HDL 46 12/07/2022 0813   CHOLHDL 2.8 12/07/2022 0813   LDLCALC 58 12/07/2022 0813    Physical Exam:    VS:  BP 128/68 (BP  Location: Left Arm, Patient Position: Sitting)   Pulse 76   Ht '5\' 3"'$  (1.6 m)   Wt 158 lb (71.7 kg)   LMP  (LMP Unknown)   SpO2 95%   BMI 27.99 kg/m     Wt Readings from Last 3 Encounters:  02/19/23 158 lb (71.7 kg)  01/19/23 156 lb 9.6 oz (71 kg)  01/01/23 155 lb 9.6 oz (70.6 kg)     GEN:  Well nourished, well developed in no acute distress HEENT: Normal NECK: No JVD; No carotid bruits LYMPHATICS: No lymphadenopathy CARDIAC: RRR, no murmurs, no rubs, no gallops RESPIRATORY:  Clear to auscultation without rales, wheezing or rhonchi  ABDOMEN: Soft, non-tender, non-distended MUSCULOSKELETAL:  No edema; No deformity  SKIN: Warm and dry LOWER EXTREMITIES: no swelling NEUROLOGIC:  Alert and oriented x 3 PSYCHIATRIC:  Normal affect   ASSESSMENT:    1. Hypomagnesemia   2. Atrial fibrillation with RVR (HCC)   3. Paroxysmal atrial fibrillation ([place])   4. Hypertension associated with diabetes ([place])    PLAN:    In order of problems listed above:  Paroxysmal atrial fibrillation.  Controlled with flecainide, continue rhythm control strategy, anticoagulation which I will continue. Hypertension: Blood pressure seems to well-controlled continue present management.  Hypomagnesemia.  Will ask her to have magnesium check and potassium checked today as well.  If it still elevated she may require to see  nephrologist (to the etiology of this phenomenon. EKG showed possibility of prolonged QT interval that I calculated very carefully it is still normal.   Medication Adjustments/Labs and Tests Ordered: Current medicines are reviewed at length with the patient today.  Concerns regarding medicines are outlined above.  Orders Placed This Encounter  Procedures   Basic metabolic panel   Magnesium   EKG 12-Lead   Medication changes: No orders of the defined types were placed in this encounter.   Signed, Park Liter, MD, [facility] 02/19/2023 11:13 AM    [place]

## 2023-02-20 LAB — MAGNESIUM: Magnesium: 1.7 mg/dL (ref 1.6–2.3)

## 2023-02-20 LAB — BASIC METABOLIC PANEL
BUN/Creatinine Ratio: 23 (ref 12–28)
BUN: 20 mg/dL (ref 8–27)
CO2: 23 mmol/L (ref 20–29)
Calcium: 10 mg/dL (ref 8.7–10.3)
Chloride: 99 mmol/L (ref 96–106)
Creatinine, Ser: 0.88 mg/dL (ref 0.57–1.00)
Glucose: 155 mg/dL — ABNORMAL HIGH (ref 70–99)
Potassium: 4.8 mmol/L (ref 3.5–5.2)
Sodium: 139 mmol/L (ref 134–144)
eGFR: 72 mL/min/{1.73_m2} (ref >59)

## 2023-02-22 ENCOUNTER — Ambulatory Visit (INDEPENDENT_AMBULATORY_CARE_PROVIDER_SITE_OTHER): Payer: PPO

## 2023-02-22 ENCOUNTER — Telehealth: Payer: PPO

## 2023-02-22 DIAGNOSIS — I4891 Unspecified atrial fibrillation: Secondary | ICD-10-CM

## 2023-02-22 DIAGNOSIS — F4321 Adjustment disorder with depressed mood: Secondary | ICD-10-CM

## 2023-02-22 DIAGNOSIS — F332 Major depressive disorder, recurrent severe without psychotic features: Secondary | ICD-10-CM

## 2023-02-22 DIAGNOSIS — I152 Hypertension secondary to endocrine disorders: Secondary | ICD-10-CM

## 2023-02-22 DIAGNOSIS — E1159 Type 2 diabetes mellitus with other circulatory complications: Secondary | ICD-10-CM

## 2023-02-22 DIAGNOSIS — E1121 Type 2 diabetes mellitus with diabetic nephropathy: Secondary | ICD-10-CM

## 2023-02-22 NOTE — Chronic Care Management (AMB) (Signed)
Chronic Care Management   CCM RN Visit Note  02/22/2023 Name: Rachel Hensley MRN: [id number] DOB: 09-19-1956  Subjective: Rachel Hensley is a 67 y.o. year old female who is a primary care patient of Cox, Kirsten, MD. The patient was referred to the Chronic Care Management team for assistance with care management needs subsequent to provider initiation of CCM services and plan of care.    Today's Visit:  Engaged with patient by telephone for follow up visit.        Goals Addressed             This Visit's Progress    CCM (depression and grief) EXPECTED OUTCOME: MONITOR, SELF-MANAGE AND REDUCE SYMPTOMS OF Depression and Grief       Current Barriers:  Care Coordination needs related to [place] support and resources in a patient with depression and grief Chronic Disease Management support and education needs related to effective management of depression and grief  Planned Interventions: Evaluation of current treatment plan related to depression and grief and patient's adherence to plan as established by provider. The patient feels she is doing okay right now with her depression and grief. Holidays are always a little hard but she has good support from her sons, grandchildren, and siblings. Doing well with her depression and grief. The patient is stable and denies any new issues related to her grief and depression. Has good support system.  Advised patient to call the office for changes in mood, anxiety, depression, or acute changes in mental health Provided education to patient re: about the [place] and the availability of grief support due to the loss of her husband, mom, brother, and other family members over the last few years. Review of the [place] program and the resources available Reviewed scheduled/upcoming provider appointments including 04-09-2023 at 0820 am Discussed plans with patient for ongoing care management follow up and provided patient with direct contact  information for care management team Advised patient to discuss changes in her mental health, depression and grief with provider. Reflective listening and support given Screening for signs and symptoms of depression related to chronic disease state  Assessed social determinant of health barriers  Symptom Management: Attend all scheduled provider appointments Attend church or other social activities Work with the social worker to address care coordination needs and will continue to work with the clinical team to address health care and disease management related needs call the Suicide and Crisis Lifeline: 988 call the [place] National Suicide Prevention Lifeline: 540-492-3331 or TTY: 954-645-2752 TTY 518 009 1981) to talk to a trained counselor call 1-800-273-TALK (toll free, 24 hour hotline) if experiencing a Mental Health or [facility] [place]   Follow Up Plan: Telephone follow up appointment with care management team member scheduled for: 04-19-2023 at 0900 am       CCM Expected Outcome:  Monitor, Self-Manage and Reduce Symptoms of Afib       Current Barriers:  Knowledge Deficits related to AFIB and recent hospitalization for new onset of AFIB Care Coordination needs related to resources and management of AFIB in a patient with new onset of AFIB with hospitalization for AFIB with RVR Chronic Disease Management support and education needs related to effective management of AFIB  Planned Interventions: Provider order and care plan reviewed. The patient feels that the current plan for effective management of her Afib is working well for her. She states that her AFIB is stable and when she last saw the cardiologist last week he made changes in  her medications that seem to be working. She is still having a little bit of trouble with her Magnesium level and is trying to keep it above 1.6. Education and support provided. Will send educational information in my chart of help with managing her  Magnesium level. Counseled on increased risk of stroke due to Afib and benefits of anticoagulation for stroke prevention           Reviewed importance of adherence to anticoagulant exactly as prescribed. Is taking medications as directed and is also taking Magnesium 1600 mg daily. The cardiologist does not like this high dose but is trying to keep her levels up of magnesium. Is being followed closely. Advised patient to discuss changes in her heart rate, changes in her sx and sx, questions and concerns with provider Counseled on bleeding risk associated with AFIB and importance of self-monitoring for signs/symptoms of bleeding Counseled on avoidance of NSAIDs due to increased bleeding risk with anticoagulants Counseled on importance of regular laboratory monitoring as prescribed. Had repeat labs last week for check on Magnesium levels Counseled on seeking medical attention after a head injury or if there is blood in the urine/stool Afib action plan reviewed. The patient is taking Eliquis 5 mg QD. She also has Cardizem to take prn. She has parameters to take the Cardizem. She had an episode last night but did not take the Cardizem. States it subsided when she relaxed. She is also taking Magnesium that is helping her as her Magnesium was low.  Screening for signs and symptoms of depression related to chronic disease state Assessed social determinant of health barriers  Symptom Management: Take medications as prescribed   Attend all scheduled provider appointments Call provider office for new concerns or questions  call the Suicide and Crisis Lifeline: 988 call the [place] National Suicide Prevention Lifeline: (229) 375-1725 or TTY: 3131378417 TTY (660) 741-1999) to talk to a trained counselor call 1-800-273-TALK (toll free, 24 hour hotline) if experiencing a Mental Health or [place]  - check pulse (heart) rate before taking medicine - make a plan to eat healthy - keep all lab  appointments - take medicine as prescribed  Follow Up Plan: Telephone follow up appointment with care management team member scheduled for: 04-19-2023 at 0900 am       CCM Expected Outcome:  Monitor, Self-Manage and Reduce Symptoms of Diabetes       Current Barriers:  Care Coordination needs related to loss of job and inability to pay for medications in a patient with DM Chronic Disease Management support and education needs related to effective management of DM Financial Constraints- working with pharm D for PAP assistance Difficulty obtaining medications  Lab Results  Component Value Date   HGBA1C 6.9 (H) 12/07/2022     Planned Interventions: Provided education to patient about basic DM disease process. Patients DM is stable. Is compliant with the plan of care. Education and support given; Reviewed medications with patient and discussed importance of medication adherence. The patient was approved for Ozempic and is on 0.'5mg'$ . She is thankful that she gets financial assistance for Ozempic through 2024. Is taking as prescribed.  Reviewed prescribed diet with patient heart healthy/ADA diet. Review and education. The patient is eating a snack before bed. She states that she is not hungry and sometimes she forgets to eat until it is late.  Counseled on importance of regular laboratory monitoring as prescribed. Has regular lab work ;        Discussed plans with patient  for ongoing care management follow up and provided patient with direct contact information for care management team;      Provided patient with written educational materials related to hypo and hyperglycemia and importance of correct treatment. The highest she has seen lately is 183 recently and that was Saturday morning;       Reviewed scheduled/upcoming provider appointments including: 04-09-2023 at [place] am Advised patient, providing education and rationale, to check cbg when you have symptoms of low or high blood sugar and as  directed   and record. The patient checks her blood sugars. This am her blood sugar was 122. Review of goal of fasting <130 and post prandial of <180     call provider for findings outside established parameters;       Referral made to pharmacy team for assistance with financial difficulties of being able to afford medications for effective management of DM. Has been approved for Ozempic through 2024 and is thankful for this;       Referral made to social work team for assistance with financial barriers, grief and depression. Has been working with the LCSW;      Review of patient status, including review of consultants reports, relevant laboratory and other test results, and medications completed;       Advised patient to discuss changes in her DM health and well being with provider;      Screening for signs and symptoms of depression related to chronic disease state;        Assessed social determinant of health barriers;       Patient to schedule an appointment with the eye doctor after the first of the year.  Symptom Management: Take medications as prescribed   Attend all scheduled provider appointments Call pharmacy for medication refills 3-7 days in advance of running out of medications Call provider office for new concerns or questions  Work with the social worker to address care coordination needs and will continue to work with the clinical team to address health care and disease management related needs call the Suicide and Crisis Lifeline: 988 call the [place] National Suicide Prevention Lifeline: 815-768-3719 or TTY: 520 678 5504 TTY 531-553-3149) to talk to a trained counselor call 1-800-273-TALK (toll free, 24 hour hotline) if experiencing a Mental Health or [place]  schedule appointment with eye doctor check feet daily for cuts, sores or redness trim toenails straight across fill half of plate with vegetables manage portion size prepare main meal at home 3 to 5  days each week wash and dry feet carefully every day wear comfortable, cotton socks wear comfortable, well-fitting shoes  Follow Up Plan: Telephone follow up appointment with care management team member scheduled for: 04-19-2023 at 0900 am       CCM Expected Outcome:  Monitor, Self-Manage, and Reduce Symptoms of Hypertension       Current Barriers:  Chronic Disease Management support and education needs related to effective management of DM Financial Constraints.  BP Readings from Last 3 Encounters:  02/19/23 128/68  01/26/23 132/70  01/19/23 138/70     Planned Interventions: Evaluation of current treatment plan related to hypertension self management and patient's adherence to plan as established by provider. The patient is compliant with the plan of care. Denies any new concerns with HTN. Blood pressures are stable. Is stable right now with her Afib. Saw the cardiologist in January and February. Had adjustment in medications. Feels like she is doing well. ;   Provided education to patient  re: stroke prevention, s/s of heart attack and stroke; Reviewed prescribed diet heart healthy/ADA diet. The patient is eating healthy. Sometimes eats late due to no appetite from the medications she is taking for her DM. Review of healthy snacks. The patient has been having issues with keeping her magnesium level up. Review of foods high in magnesium and will attach information in my chart. Education and support given. Reviewed medications with patient and discussed importance of compliance. The patient is compliant with medications. Works with [place] D for medication assistance and support. Patient got approved for help with medication cost. Ongoing support and education from the pharm D.;  Discussed plans with patient for ongoing care management follow up and provided patient with direct contact information for care management team; Advised patient, providing education and rationale, to monitor blood  pressure daily and record, calling PCP for findings outside established parameters;  Advised patient to discuss changes in blood pressure or other heart concerns  with provider; Provided education on prescribed diet heart healthy/ADA diet. Education on foods high in Magnesium: fish, greens, nuts, vegetables, beans ;  Discussed complications of poorly controlled blood pressure such as heart disease, stroke, circulatory complications, vision complications, kidney impairment, sexual dysfunction;  Next pcp appointment on 04-09-2023. Saw cardiologist on 02-19-2023. Denies any new needs at this time.  Symptom Management: Take medications as prescribed   Attend all scheduled provider appointments Call pharmacy for medication refills 3-7 days in advance of running out of medications Call provider office for new concerns or questions  call the Suicide and Crisis Lifeline: 988 call the [place] National Suicide Prevention Lifeline: (936)020-2786 or TTY: 716-883-7687 TTY (843) 565-2839) to talk to a trained counselor call 1-800-273-TALK (toll free, 24 hour hotline) if experiencing a Mental Health or [place]  check blood pressure weekly learn about high blood pressure call doctor for signs and symptoms of high blood pressure develop an action plan for high blood pressure keep all doctor appointments take medications for blood pressure exactly as prescribed report new symptoms to your doctor  Follow Up Plan: Telephone follow up appointment with care management team member scheduled for: 04-19-2023 at 0900 am          Plan:Telephone follow up appointment with care management team member scheduled for:  04-19-2023 at 0900 am  Noreene Larsson RN, MSN, CCM RN Care Manager  Chronic Care Management Direct Number: [phone]

## 2023-02-22 NOTE — Patient Instructions (Addendum)
Please call the care guide team at [phone] if you need to cancel or reschedule your appointment.   If you are experiencing a Mental Health or [place] or need someone to talk to, please call the Suicide and Crisis Lifeline: 988 call the [place] National Suicide Prevention Lifeline: 925 761 2136 or TTY: 639-091-8844 TTY 904 840 7723) to talk to a trained counselor call 1-800-273-TALK (toll free, 24 hour hotline) go to [facility] [phone])   Following is a copy of the CCM Program Consent:  CCM service includes personalized support from designated clinical staff supervised by the physician, including individualized plan of care and coordination with other care providers 24/7 contact phone numbers for assistance for urgent and routine care needs. Service will only be billed when office clinical staff spend 20 minutes or more in a month to coordinate care. Only one practitioner may furnish and bill the service in a calendar month. The patient may stop CCM services at amy time (effective at the end of the month) by phone call to the office staff. The patient will be responsible for cost sharing (co-pay) or up to 20% of the service fee (after annual deductible is met)  Following is a copy of your full provider care plan:   Goals Addressed             This Visit's Progress    CCM (depression and grief) EXPECTED OUTCOME: MONITOR, SELF-MANAGE AND REDUCE SYMPTOMS OF Depression and Grief       Current Barriers:  Care Coordination needs related to [place] support and resources in a patient with depression and grief Chronic Disease Management support and education needs related to effective management of depression and grief  Planned Interventions: Evaluation of current treatment plan related to depression and grief and patient's adherence to plan as established by provider. The patient feels she is doing okay  right now with her depression and grief. Holidays are always a little hard but she has good support from her sons, grandchildren, and siblings. Doing well with her depression and grief. The patient is stable and denies any new issues related to her grief and depression. Has good support system.  Advised patient to call the office for changes in mood, anxiety, depression, or acute changes in mental health Provided education to patient re: about the [place] and the availability of grief support due to the loss of her husband, mom, brother, and other family members over the last few years. Review of the [place] program and the resources available Reviewed scheduled/upcoming provider appointments including 04-09-2023 at 0820 am Discussed plans with patient for ongoing care management follow up and provided patient with direct contact information for care management team Advised patient to discuss changes in her mental health, depression and grief with provider. Reflective listening and support given Screening for signs and symptoms of depression related to chronic disease state  Assessed social determinant of health barriers  Symptom Management: Attend all scheduled provider appointments Attend church or other social activities Work with the social worker to address care coordination needs and will continue to work with the clinical team to address health care and disease management related needs call the Suicide and Crisis Lifeline: 988 call the [place] National Suicide Prevention Lifeline: (706) 572-8410 or TTY: 870-587-6016 TTY 859 428 1529) to talk to a trained counselor call 1-800-273-TALK (toll free, 24 hour hotline) if experiencing a Mental Health or [place]   Follow Up Plan: Telephone follow up appointment with care  management team member scheduled for: 04-19-2023 at 0900 am       CCM Expected Outcome:  Monitor, Self-Manage and Reduce Symptoms of Afib       Current  Barriers:  Knowledge Deficits related to AFIB and recent hospitalization for new onset of AFIB Care Coordination needs related to resources and management of AFIB in a patient with new onset of AFIB with hospitalization for AFIB with [facility] Chronic Disease Management support and education needs related to effective management of AFIB  Planned Interventions: Provider order and care plan reviewed. The patient feels that the current plan for effective management of her Afib is working well for her. She states that her AFIB is stable and when she last saw the cardiologist last week he made changes in her medications that seem to be working. She is still having a little bit of trouble with her Magnesium level and is trying to keep it above 1.6. Education and support provided. Will send educational information in my chart of help with managing her Magnesium level. Counseled on increased risk of stroke due to Afib and benefits of anticoagulation for stroke prevention           Reviewed importance of adherence to anticoagulant exactly as prescribed. Is taking medications as directed and is also taking Magnesium 1600 mg daily. The cardiologist does not like this high dose but is trying to keep her levels up of magnesium. Is being followed closely. Advised patient to discuss changes in her heart rate, changes in her sx and sx, questions and concerns with provider Counseled on bleeding risk associated with AFIB and importance of self-monitoring for signs/symptoms of bleeding Counseled on avoidance of NSAIDs due to increased bleeding risk with anticoagulants Counseled on importance of regular laboratory monitoring as prescribed. Had repeat labs last week for check on Magnesium levels Counseled on seeking medical attention after a head injury or if there is blood in the urine/stool Afib action plan reviewed. The patient is taking Eliquis 5 mg QD. She also has Cardizem to take prn. She has parameters to take the  Cardizem. She had an episode last night but did not take the Cardizem. States it subsided when she relaxed. She is also taking Magnesium that is helping her as her Magnesium was low.  Screening for signs and symptoms of depression related to chronic disease state Assessed social determinant of health barriers  Symptom Management: Take medications as prescribed   Attend all scheduled provider appointments Call provider office for new concerns or questions  call the Suicide and Crisis Lifeline: 988 call the [place] National Suicide Prevention Lifeline: 908 352 4919 or TTY: (506)863-2019 TTY 662-450-1177) to talk to a trained counselor call 1-800-273-TALK (toll free, 24 hour hotline) if experiencing a Mental Health or [place]  - check pulse (heart) rate before taking medicine - make a plan to eat healthy - keep all lab appointments - take medicine as prescribed  Follow Up Plan: Telephone follow up appointment with care management team member scheduled for: 04-19-2023 at 0900 am       CCM Expected Outcome:  Monitor, Self-Manage and Reduce Symptoms of Diabetes       Current Barriers:  Care Coordination needs related to loss of job and inability to pay for medications in a patient with DM Chronic Disease Management support and education needs related to effective management of DM Financial Constraints- working with pharm D for PAP assistance Difficulty obtaining medications  Lab Results  Component Value Date   HGBA1C  6.9 (H) 12/07/2022     Planned Interventions: Provided education to patient about basic DM disease process. Patients DM is stable. Is compliant with the plan of care. Education and support given; Reviewed medications with patient and discussed importance of medication adherence. The patient was approved for Ozempic and is on 0.'5mg'$ . She is thankful that she gets financial assistance for Ozempic through 2024. Is taking as prescribed.  Reviewed prescribed diet  with patient heart healthy/ADA diet. Review and education. The patient is eating a snack before bed. She states that she is not hungry and sometimes she forgets to eat until it is late.  Counseled on importance of regular laboratory monitoring as prescribed. Has regular lab work ;        Discussed plans with patient for ongoing care management follow up and provided patient with direct contact information for care management team;      Provided patient with written educational materials related to hypo and hyperglycemia and importance of correct treatment. The highest she has seen lately is 183 recently and that was Saturday morning;       Reviewed scheduled/upcoming provider appointments including: 04-09-2023 at [place] am Advised patient, providing education and rationale, to check cbg when you have symptoms of low or high blood sugar and as directed   and record. The patient checks her blood sugars. This am her blood sugar was 122. Review of goal of fasting <130 and post prandial of <180     call provider for findings outside established parameters;       Referral made to pharmacy team for assistance with financial difficulties of being able to afford medications for effective management of DM. Has been approved for Ozempic through 2024 and is thankful for this;       Referral made to social work team for assistance with financial barriers, grief and depression. Has been working with the LCSW;      Review of patient status, including review of consultants reports, relevant laboratory and other test results, and medications completed;       Advised patient to discuss changes in her DM health and well being with provider;      Screening for signs and symptoms of depression related to chronic disease state;        Assessed social determinant of health barriers;       Patient to schedule an appointment with the eye doctor after the first of the year.  Symptom Management: Take medications as prescribed    Attend all scheduled provider appointments Call pharmacy for medication refills 3-7 days in advance of running out of medications Call provider office for new concerns or questions  Work with the social worker to address care coordination needs and will continue to work with the clinical team to address health care and disease management related needs call the Suicide and Crisis Lifeline: 988 call the [place] National Suicide Prevention Lifeline: 334-344-7262 or TTY: 228-762-4344 TTY (763) 127-1182) to talk to a trained counselor call 1-800-273-TALK (toll free, 24 hour hotline) if experiencing a Mental Health or [place]  schedule appointment with eye doctor check feet daily for cuts, sores or redness trim toenails straight across fill half of plate with vegetables manage portion size prepare main meal at home 3 to 5 days each week wash and dry feet carefully every day wear comfortable, cotton socks wear comfortable, well-fitting shoes  Follow Up Plan: Telephone follow up appointment with care management team member scheduled for: 04-19-2023 at 0900 am  CCM Expected Outcome:  Monitor, Self-Manage, and Reduce Symptoms of Hypertension       Current Barriers:  Chronic Disease Management support and education needs related to effective management of DM Financial Constraints.  BP Readings from Last 3 Encounters:  02/19/23 128/68  01/26/23 132/70  01/19/23 138/70     Planned Interventions: Evaluation of current treatment plan related to hypertension self management and patient's adherence to plan as established by provider. The patient is compliant with the plan of care. Denies any new concerns with HTN. Blood pressures are stable. Is stable right now with her Afib. Saw the cardiologist in January and February. Had adjustment in medications. Feels like she is doing well. ;   Provided education to patient re: stroke prevention, s/s of heart attack and stroke; Reviewed  prescribed diet heart healthy/ADA diet. The patient is eating healthy. Sometimes eats late due to no appetite from the medications she is taking for her DM. Review of healthy snacks. The patient has been having issues with keeping her magnesium level up. Review of foods high in magnesium and will attach information in my chart. Education and support given. Reviewed medications with patient and discussed importance of compliance. The patient is compliant with medications. Works with [place] D for medication assistance and support. Patient got approved for help with medication cost. Ongoing support and education from the pharm D.;  Discussed plans with patient for ongoing care management follow up and provided patient with direct contact information for care management team; Advised patient, providing education and rationale, to monitor blood pressure daily and record, calling PCP for findings outside established parameters;  Advised patient to discuss changes in blood pressure or other heart concerns  with provider; Provided education on prescribed diet heart healthy/ADA diet. Education on foods high in Magnesium: fish, greens, nuts, vegetables, beans ;  Discussed complications of poorly controlled blood pressure such as heart disease, stroke, circulatory complications, vision complications, kidney impairment, sexual dysfunction;  Next pcp appointment on 04-09-2023. Saw cardiologist on 02-19-2023. Denies any new needs at this time.  Symptom Management: Take medications as prescribed   Attend all scheduled provider appointments Call pharmacy for medication refills 3-7 days in advance of running out of medications Call provider office for new concerns or questions  call the Suicide and Crisis Lifeline: 988 call the [place] National Suicide Prevention Lifeline: 564-535-4872 or TTY: 623-651-6175 TTY 773-634-3047) to talk to a trained counselor call 1-800-273-TALK (toll free, 24 hour hotline) if experiencing a  Mental Health or [place]  check blood pressure weekly learn about high blood pressure call doctor for signs and symptoms of high blood pressure develop an action plan for high blood pressure keep all doctor appointments take medications for blood pressure exactly as prescribed report new symptoms to your doctor  Follow Up Plan: Telephone follow up appointment with care management team member scheduled for: 04-19-2023 at 0900 am          Patient verbalizes understanding of instructions and care plan provided today and agrees to view in [place]. Active MyChart status and patient understanding of how to access instructions and care plan via MyChart confirmed with patient.     Telephone follow up appointment with care management team member scheduled for: 04-19-2023 at 0900 am  Hypomagnesemia Hypomagnesemia is a condition in which the level of magnesium in the blood is too low. Magnesium is a mineral that is found in many foods. It is used in many different processes in the body. Hypomagnesemia can  affect every organ in the body. In severe cases, it can cause life-threatening problems. What are the causes? This condition may be caused by: Not getting enough magnesium in your diet or not having enough healthy foods to eat (malnutrition). Problems with magnesium absorption in the intestines. Dehydration. Excessive use of alcohol. Vomiting. Severe or long-term (chronic) diarrhea. Some medicines, including medicines that make you urinate more often (diuretics). Certain diseases, such as kidney disease, diabetes, celiac disease, and overactive thyroid. What are the signs or symptoms? Symptoms of this condition include: Loss of appetite, nausea, and vomiting. Involuntary shaking or trembling of a body part (tremor). Muscle weakness or tingling in the arms and legs. Sudden tightening of muscles (muscle spasms). Confusion. Psychiatric issues, such as: Depression and  irritability. Psychosis. A feeling of fluttering of the heart (palpitations). Seizures. These symptoms are more severe if magnesium levels drop suddenly. How is this diagnosed? This condition may be diagnosed based on: Your symptoms and medical history. A physical exam. Blood and urine tests. How is this treated? Treatment depends on the cause and the severity of the condition. It may be treated by: Taking a magnesium supplement. This can be taken in pill form. If the condition is severe, magnesium is usually given through an IV. Making changes to your diet. You may be directed to eat foods that have a lot of magnesium, such as green leafy vegetables, peas, beans, and nuts. Not drinking alcohol. If you are struggling not to drink, ask your health care provider for help. Follow these instructions at home: Eating and drinking     Make sure that your diet includes foods with magnesium. Foods that have a lot of magnesium in them include: Green leafy vegetables, such as spinach and broccoli. Beans and peas. Nuts and seeds, such as almonds and sunflower seeds. Whole grains, such as whole grain bread and fortified cereals. Drink fluids that contain salts and minerals (electrolytes), such as sports drinks, when you are active. Do not drink alcohol. General instructions Take over-the-counter and prescription medicines only as told by your health care provider. Take magnesium supplements as directed if your health care provider tells you to take them. Have your magnesium levels monitored as told by your health care provider. Keep all follow-up visits. This is important. Contact a health care provider if: You get worse instead of better. Your symptoms return. Get help right away if: You develop severe muscle weakness. You have trouble breathing. You feel that your heart is racing. These symptoms may represent a serious problem that is an emergency. Do not wait to see if the symptoms will  go away. Get medical help right away. Call your local emergency services (911 in the [place] Do not drive yourself to the hospital. Summary Hypomagnesemia is a condition in which the level of magnesium in the blood is too low. Hypomagnesemia can affect every organ in the body. Treatment may include eating more foods that contain magnesium, taking magnesium supplements, and not drinking alcohol. Have your magnesium levels monitored as told by your health care provider. This information is not intended to replace advice given to you by your health care provider. Make sure you discuss any questions you have with your health care provider. Document Revised: 05/13/2021 Document Reviewed: 05/13/2021 Elsevier Patient Education  [place].

## 2023-02-23 ENCOUNTER — Telehealth: Payer: Self-pay

## 2023-02-23 NOTE — Telephone Encounter (Signed)
Left message on My Chart with normal results per Dr. Wendy Poet note

## 2023-02-24 ENCOUNTER — Telehealth: Payer: Self-pay

## 2023-02-24 ENCOUNTER — Other Ambulatory Visit: Payer: Self-pay

## 2023-02-24 MED ORDER — GLIMEPIRIDE 4 MG PO TABS: 4.0000 mg | ORAL_TABLET | Freq: Two times a day (BID) | ORAL | 0 refills | Status: DC

## 2023-02-24 NOTE — Telephone Encounter (Signed)
Pt viewed results on My Chart

## 2023-02-25 DIAGNOSIS — I152 Hypertension secondary to endocrine disorders: Secondary | ICD-10-CM

## 2023-02-25 DIAGNOSIS — E1121 Type 2 diabetes mellitus with diabetic nephropathy: Secondary | ICD-10-CM

## 2023-02-25 DIAGNOSIS — I4891 Unspecified atrial fibrillation: Secondary | ICD-10-CM

## 2023-02-25 DIAGNOSIS — F332 Major depressive disorder, recurrent severe without psychotic features: Secondary | ICD-10-CM

## 2023-02-25 DIAGNOSIS — E1159 Type 2 diabetes mellitus with other circulatory complications: Secondary | ICD-10-CM

## 2023-02-25 DIAGNOSIS — F4321 Adjustment disorder with depressed mood: Secondary | ICD-10-CM

## 2023-03-01 ENCOUNTER — Ambulatory Visit: Payer: PPO | Admitting: Cardiology

## 2023-03-01 ENCOUNTER — Other Ambulatory Visit: Payer: Self-pay

## 2023-03-01 DIAGNOSIS — R600 Localized edema: Secondary | ICD-10-CM

## 2023-03-01 MED ORDER — FUROSEMIDE 20 MG PO TABS: 20.0000 mg | ORAL_TABLET | Freq: Every day | ORAL | 1 refills | Status: DC

## 2023-03-01 NOTE — Telephone Encounter (Signed)
Patient called requesting refill of Furosemide be sent to [facility].  Last refilled 01/04/23 #30

## 2023-04-08 NOTE — Progress Notes (Unsigned)
Subjective:  Patient ID: Rachel Hensley, female    DOB: March 19, 1956  Age: 67 y.o. MRN: [id number]  Chief Complaint  Patient presents with   Diabetes   Hypertension    HPI Diabetes:  Complications: nephropathy.  Glucose checking: daily  Glucose logs: 133-200 Hypoglycemia: no Most recent A1C: 6.9% Current medications:  Glimepiride 4 mg BID, Ozempic 0.5mg  weekly, Metformin 1000 mg twice daily, Kerendia 10 mg daily Last Eye Exam: 03/18/2022. Has retinopathy screening scheduled in May here at our office. Foot checks: daily. Not exercising due to knee pain.  Hyperlipidemia: Current medications: Vascepa 1 gm 2 capsules twice a day and atorvastatin 20 mg once daily.    Hypertension: Current medications: metoprolol 50 mg daily, lasix 20 mg daily.   Atrial fibrillation:  GERD: omeprazole 20 mg twice daily. Well controlled.   Diet: fairly healthy Exercise: had to stop.      04/09/2023    8:20 AM 10/08/2022   10:43 AM 07/28/2022    8:10 AM 05/29/2022   11:04 AM 04/15/2022    7:51 AM  Depression screen PHQ 2/9  Decreased Interest 0 1 0 0 0  Down, Depressed, Hopeless 0 1 0 0 0  PHQ - 2 Score 0 2 0 0 0  Altered sleeping  1 0    Tired, decreased energy  1 0    Change in appetite  0 0    Feeling bad or failure about yourself   0 0    Trouble concentrating  0 0    Moving slowly or fidgety/restless  0 0    Suicidal thoughts  0 0    PHQ-9 Score  4 0    Difficult doing work/chores  Not difficult at all Not difficult at all           04/15/2022    7:50 AM 05/29/2022   11:04 AM 10/21/2022   11:09 AM 12/24/2022    9:05 AM 04/09/2023    8:19 AM  Fall Risk  Falls in the past year? 0 0 0 0 0  Was there an injury with Fall? 0 1 0 0 0  Fall Risk Category Calculator 0 1 0 0 0  Fall Risk Category (Retired) Low Low Low Low   (RETIRED) Patient Fall Risk Level  Low fall risk Low fall risk    Patient at Risk for Falls Due to  No Fall Risks  No Fall Risks No Fall Risks  Fall risk Follow up  Falls evaluation completed Falls evaluation completed;Education provided Falls evaluation completed;Education provided  Falls evaluation completed      Review of Systems  Constitutional:  Negative for chills, fatigue and fever.  HENT:  Negative for congestion, ear pain, postnasal drip, rhinorrhea, sinus pressure, sinus pain and sore throat.   Respiratory:  Negative for cough and shortness of breath.   Cardiovascular:  Negative for chest pain.  Gastrointestinal:  Positive for diarrhea (3-9 stools per day. loose. this worsened since pt had to increase her magnesium oxide 400 mg 4 daily.  Also may be secondary to metformin.). Negative for abdominal pain, constipation, nausea and vomiting.  Genitourinary:  Negative for dysuria and urgency.  Musculoskeletal:  Negative for back pain and myalgias.  Neurological:  Negative for dizziness, weakness, light-headedness and headaches.  Psychiatric/Behavioral:  Negative for dysphoric mood. The patient is not nervous/anxious.     Current Outpatient Medications on File Prior to Visit  Medication Sig Dispense Refill   apixaban (ELIQUIS) 5 MG TABS tablet Take  1 tablet (5 mg total) by mouth 2 (two) times daily. 180 tablet 1   APPLE CIDER VINEGAR PO Take 1 tablet by mouth in the morning and at bedtime.     atorvastatin (LIPITOR) 20 MG tablet TAKE 1 TABLET DAILY (Patient taking differently: Take 20 mg by mouth daily.) 90 tablet 1   cetirizine (ZYRTEC) 10 MG tablet Take 10 mg by mouth daily.     diltiazem (CARDIZEM) 30 MG tablet Take 30 mg by mouth every 6 (six) hours as needed.     EPINEPHrine 0.3 mg/0.3 mL IJ SOAJ injection Inject 0.3 mg into the muscle once.     Finerenone (KERENDIA) 10 MG TABS Take 1 tablet by mouth daily.     flecainide (TAMBOCOR) 50 MG tablet Take 1 tablet (50 mg total) by mouth 2 (two) times daily. 180 tablet 3   furosemide (LASIX) 20 MG tablet Take 1 tablet (20 mg total) by mouth daily. 30 tablet 1   glimepiride (AMARYL) 4 MG tablet Take  1 tablet (4 mg total) by mouth 2 (two) times daily. 180 tablet 0   icosapent Ethyl (VASCEPA) 1 g capsule Take 2 capsules (2 g total) by mouth 2 (two) times daily. 360 capsule 1   Iron, Ferrous Sulfate, 325 (65 Fe) MG TABS Take 325 mg by mouth daily. 30 tablet 2   magnesium oxide (MAG-OX) 400 (240 Mg) MG tablet Take 800 mg by mouth 2 (two) times daily.     metFORMIN (GLUCOPHAGE) 1000 MG tablet Take 1 tablet (1,000 mg total) by mouth 2 (two) times daily with a meal. 180 tablet 0   metoprolol succinate (TOPROL-XL) 50 MG 24 hr tablet Take 1 tablet (50 mg total) by mouth daily. Take with or immediately following a meal. 90 tablet 3   omeprazole (PRILOSEC) 20 MG capsule Take 1 capsule (20 mg total) by mouth 2 (two) times daily. 180 capsule 1   ondansetron (ZOFRAN ODT) 4 MG disintegrating tablet Take 1 tablet (4 mg total) by mouth every 8 (eight) hours as needed for nausea or vomiting. 30 tablet 0   promethazine (PHENERGAN) 25 MG tablet Take 1 tablet (25 mg total) by mouth every 8 (eight) hours as needed for nausea or vomiting. 20 tablet 0   Semaglutide,0.25 or 0.5MG /DOS, (OZEMPIC, 0.25 OR 0.5 MG/DOSE,) 2 MG/1.5ML SOPN Inject 0.5 mg into the skin once a week.     No current facility-administered medications on file prior to visit.   Past Medical History:  Diagnosis Date   Angioedema 04/05/2018   Hypoglycemia due to type 2 diabetes mellitus 08/21/2020   Mixed hyperlipidemia    Nontoxic multinodular goiter    Other vitamin B12 deficiency anemias    Primary insomnia 05/20/2020   Solitary pulmonary nodule    Type 2 diabetes mellitus with other specified complication    Past Surgical History:  Procedure Laterality Date   ABDOMINAL HYSTERECTOMY  06/1993   partial; still has both ovaries   HYSTEROTOMY     partial    Family History  Problem Relation Age of Onset   Coronary artery disease Mother    Glaucoma Mother    Colon cancer Father    Diabetes Sister    Atrial fibrillation Sister    Cancer  Brother        renal   Kidney cancer Brother    Diabetes type II Brother    Coronary artery disease Brother    Diabetes Brother    Diabetes Brother    Coronary artery  disease Brother    Asthma Son        childhood asthma   Arrhythmia Son    Atrial fibrillation Son    Parkinsonism Son    Eczema Grandchild    Atopy Grandchild    Allergic rhinitis Neg Hx    Angioedema Neg Hx    Immunodeficiency Neg Hx    Urticaria Neg Hx    Breast cancer Neg Hx    Social History   Socioeconomic History   Marital status: Widowed    Spouse name: Not on file   Number of children: 3   Years of education: Not on file   Highest education level: Not on file  Occupational History   Occupation: Sewer  Tobacco Use   Smoking status: Never   Smokeless tobacco: Never  Vaping Use   Vaping Use: Never used  Substance and Sexual Activity   Alcohol use: Never   Drug use: Never   Sexual activity: Not on file  Other Topics Concern   Not on file  Social History Narrative   Not on file   Social Determinants of Health   Financial Resource Strain: Medium Risk (12/16/2022)   Overall Financial Resource Strain (CARDIA)    Difficulty of Paying Living Expenses: Somewhat hard  Food Insecurity: No Food Insecurity (12/16/2022)   Hunger Vital Sign    Worried About Running Out of Food in the Last Year: Never true    Ran Out of Food in the Last Year: Never true  Recent Concern: Food Insecurity - Food Insecurity Present (10/08/2022)   Hunger Vital Sign    Worried About Running Out of Food in the Last Year: Sometimes true    Ran Out of Food in the Last Year: Never true  Transportation Needs: No Transportation Needs (12/29/2022)   PRAPARE - Administrator, Civil Service (Medical): No    Lack of Transportation (Non-Medical): No  Physical Activity: Sufficiently Active (12/16/2022)   Exercise Vital Sign    Days of Exercise per Week: 5 days    Minutes of Exercise per Session: 30 min  Stress: No Stress  Concern Present (12/16/2022)   [employer] of Occupational Health - Occupational Stress Questionnaire    Feeling of Stress : Only a little  Social Connections: Moderately Isolated (12/16/2022)   Social Connection and Isolation Panel [NHANES]    Frequency of Communication with Friends and Family: More than three times a week    Frequency of Social Gatherings with Friends and Family: More than three times a week    Attends Religious Services: More than 4 times per year    Active Member of [employer] or Organizations: No    Attends Banker Meetings: Never    Marital Status: Widowed    Objective:  BP 118/72   Pulse 76   Temp (!) 97.2 F (36.2 C)   Ht 5\' 3"  (1.6 m)   Wt 158 lb (71.7 kg)   LMP  (LMP Unknown)   SpO2 98%   BMI 27.99 kg/m      04/09/2023    8:16 AM 02/19/2023   10:32 AM 01/26/2023   12:26 PM  BP/Weight  Systolic BP 118 128 132  Diastolic BP 72 68 70  Wt. (Lbs) 158 158   BMI 27.99 kg/m2 27.99 kg/m2     Physical Exam Vitals reviewed.  Constitutional:      Appearance: Normal appearance. She is normal weight.  Neck:     Vascular: No carotid bruit.  Cardiovascular:     Rate and Rhythm: Normal rate and regular rhythm.     Heart sounds: Normal heart sounds.  Pulmonary:     Effort: Pulmonary effort is normal. No respiratory distress.     Breath sounds: Normal breath sounds.  Abdominal:     General: Abdomen is flat. Bowel sounds are normal.     Palpations: Abdomen is soft.     Tenderness: There is no abdominal tenderness.  Neurological:     Mental Status: She is alert and oriented to person, place, and time.  Psychiatric:        Mood and Affect: Mood normal.        Behavior: Behavior normal.     Diabetic Foot Exam - Simple   Simple Foot Form Diabetic Foot exam was performed with the following findings: Yes 04/09/2023  8:33 AM  Visual Inspection No deformities, no ulcerations, no other skin breakdown bilaterally: Yes Sensation Testing Intact  to touch and monofilament testing bilaterally: Yes Pulse Check Posterior Tibialis and Dorsalis pulse intact bilaterally: Yes Comments      Lab Results  Component Value Date   WBC 7.5 12/07/2022   HGB 11.0 (L) 12/07/2022   HCT 34.4 12/07/2022   PLT 327 12/07/2022   GLUCOSE 155 (H) 02/19/2023   CHOL 128 12/07/2022   TRIG 141 12/07/2022   HDL 46 12/07/2022   LDLCALC 58 12/07/2022   ALT 14 12/07/2022   AST 13 12/07/2022   NA 139 02/19/2023   K 4.8 02/19/2023   CL 99 02/19/2023   CREATININE 0.88 02/19/2023   BUN 20 02/19/2023   CO2 23 02/19/2023   TSH 0.777 01/19/2023   HGBA1C 6.9 (H) 12/07/2022   MICROALBUR 150 10/27/2021      Assessment & Plan:    Paroxysmal atrial fibrillation Assessment & Plan: Management per specialist. Continue metoprolol 50 mg daily, flecainide 50 mg one oral twice daily.  Continue eliquis 5 mg twice daily.  Has diltiazem 30 mg four times a day as needed palpitations.   Hypertension associated with diabetes Assessment & Plan: Well controlled.  No medication changes recommended. Continue metoprolol 50 mg daily, lasix 20 mg daily. Continue healthy diet and exercise.    Orders: -     Comprehensive metabolic panel -     CBC with Differential/Platelet  GERD without esophagitis Assessment & Plan: The current medical regimen is effective;  continue present plan and medications. Continue omeprazole 20 mg twice daily    Diabetic glomerulopathy Assessment & Plan: Recommend check sugars fasting daily. Recommend check feet daily. Recommend annual eye exams. Medicines: Continue current medications will increase ozempic to 1 mg weekly. Continue to work on eating a healthy diet and exercise.  Labs drawn today.     Rx for Ozempic 1 mg faxed to [phone]) [employer] for pt assistance program.  Orders: -     Hemoglobin A1c -     Microalbumin / creatinine urine ratio -     Semaglutide (1 MG/DOSE); Inject 1 mg as directed once a week.   Dispense: 3 mL; Refill: 1  Mixed hyperlipidemia Assessment & Plan: Well controlled.  No changes to medicines. Continue Vascepa 1 gm 2 capsules twice a day and atorvastatin 20 mg once daily.   Continue to work on eating a healthy diet and exercise.  Labs drawn today.    Orders: -     Lipid panel  Screening mammogram for breast cancer -     3D Screening Mammogram, Left and Right;  Future  ACEI/ARB contraindicated  Diarrhea, unspecified type Assessment & Plan: Await labs.    Hypomagnesemia Assessment & Plan: Check magnesium level.        Meds ordered this encounter  Medications   Semaglutide, 1 MG/DOSE, 4 MG/3ML SOPN    Sig: Inject 1 mg as directed once a week.    Dispense:  3 mL    Refill:  1    Orders Placed This Encounter  Procedures   MM 3D SCREENING MAMMOGRAM BILATERAL BREAST   Comprehensive metabolic panel   Hemoglobin A1c   Lipid panel   CBC with Differential/Platelet   Microalbumin / creatinine urine ratio     Follow-up: Return in about 3 months (around 07/09/2023) for chronic, fasting.   I,Marla I Leal-Borjas,acting as a scribe for Blane OharaKirsten Shaterrica Territo, MD.,have documented all relevant documentation on the behalf of Blane OharaKirsten Sarahanne Novakowski, MD,as directed by  Blane OharaKirsten Mikenzie Mccannon, MD while in the presence of Blane OharaKirsten Josuha Fontanez, MD.   An After Visit Summary was printed and given to the patient.  I attest that I have reviewed this visit and agree with the plan scribed by my staff.   Blane OharaKirsten Ahriyah Vannest, MD [facility] [phone]

## 2023-04-09 ENCOUNTER — Ambulatory Visit (INDEPENDENT_AMBULATORY_CARE_PROVIDER_SITE_OTHER): Payer: PPO | Admitting: Family Medicine

## 2023-04-09 VITALS — BP 118/72 | HR 76 | Temp 97.2°F | Ht 63.0 in | Wt 158.0 lb

## 2023-04-09 DIAGNOSIS — I480 Paroxysmal atrial fibrillation: Secondary | ICD-10-CM

## 2023-04-09 DIAGNOSIS — K219 Gastro-esophageal reflux disease without esophagitis: Secondary | ICD-10-CM | POA: Diagnosis not present

## 2023-04-09 DIAGNOSIS — E782 Mixed hyperlipidemia: Secondary | ICD-10-CM

## 2023-04-09 DIAGNOSIS — E1121 Type 2 diabetes mellitus with diabetic nephropathy: Secondary | ICD-10-CM | POA: Diagnosis not present

## 2023-04-09 DIAGNOSIS — Z1231 Encounter for screening mammogram for malignant neoplasm of breast: Secondary | ICD-10-CM

## 2023-04-09 DIAGNOSIS — I152 Hypertension secondary to endocrine disorders: Secondary | ICD-10-CM

## 2023-04-09 DIAGNOSIS — E1159 Type 2 diabetes mellitus with other circulatory complications: Secondary | ICD-10-CM

## 2023-04-09 DIAGNOSIS — Z5309 Procedure and treatment not carried out because of other contraindication: Secondary | ICD-10-CM

## 2023-04-09 DIAGNOSIS — R197 Diarrhea, unspecified: Secondary | ICD-10-CM

## 2023-04-09 MED ORDER — SEMAGLUTIDE (1 MG/DOSE) 4 MG/3ML ~~LOC~~ SOPN: 1.0000 mg | PEN_INJECTOR | SUBCUTANEOUS | 1 refills | Status: DC

## 2023-04-09 NOTE — Assessment & Plan Note (Addendum)
  Recommend check sugars fasting daily. Recommend check feet daily. Recommend annual eye exams. Medicines: Continue current medications will increase ozempic to 1 mg weekly Continue to work on eating a healthy diet and exercise.  Labs drawn today.     Rx for Ozempic 1 mg faxed to [phone]) [employer] for pt assistance program.

## 2023-04-09 NOTE — Assessment & Plan Note (Signed)
The current medical regimen is effective;  continue present plan and medications. Continue omeprazole 20 mg twice daily.  

## 2023-04-09 NOTE — Patient Instructions (Addendum)
Discuss with pharmacist about Tdap and shingrix vaccines.

## 2023-04-09 NOTE — Assessment & Plan Note (Signed)
Well controlled.  No changes to medicines. Continue Vascepa 1 gm 2 capsules twice a day and atorvastatin 20 mg once daily.   Continue to work on eating a healthy diet and exercise.  Labs drawn today.

## 2023-04-09 NOTE — Assessment & Plan Note (Signed)
Well controlled.  No medication changes recommended. Continue metoprolol 50 mg daily, lasix 20 mg daily. Continue healthy diet and exercise.

## 2023-04-10 NOTE — Assessment & Plan Note (Signed)
Management per specialist. Continue metoprolol 50 mg daily, flecainide 50 mg one oral twice daily.  Continue eliquis 5 mg twice daily.  Has diltiazem 30 mg four times a day as needed palpitations.

## 2023-04-11 ENCOUNTER — Other Ambulatory Visit: Payer: Self-pay | Admitting: Family Medicine

## 2023-04-11 ENCOUNTER — Encounter: Payer: Self-pay | Admitting: Family Medicine

## 2023-04-11 DIAGNOSIS — R197 Diarrhea, unspecified: Secondary | ICD-10-CM

## 2023-04-11 DIAGNOSIS — Z1231 Encounter for screening mammogram for malignant neoplasm of breast: Secondary | ICD-10-CM | POA: Insufficient documentation

## 2023-04-11 HISTORY — DX: Diarrhea, unspecified: R19.7

## 2023-04-11 NOTE — Assessment & Plan Note (Signed)
Await labs.

## 2023-04-11 NOTE — Assessment & Plan Note (Signed)
Check magnesium level 

## 2023-04-12 LAB — CBC WITH DIFFERENTIAL/PLATELET
Basophils Absolute: 0.1 10*3/uL (ref 0.0–0.2)
Basos: 1 %
EOS (ABSOLUTE): 0.3 10*3/uL (ref 0.0–0.4)
Eos: 3 %
Hematocrit: 37.8 % (ref 34.0–46.6)
Hemoglobin: 11.9 g/dL (ref 11.1–15.9)
Immature Grans (Abs): 0 10*3/uL (ref 0.0–0.1)
Immature Granulocytes: 1 %
Lymphocytes Absolute: 3.4 10*3/uL — ABNORMAL HIGH (ref 0.7–3.1)
Lymphs: 38 %
MCH: 25.2 pg — ABNORMAL LOW (ref 26.6–33.0)
MCHC: 31.5 g/dL (ref 31.5–35.7)
MCV: 80 fL (ref 79–97)
Monocytes Absolute: 0.6 10*3/uL (ref 0.1–0.9)
Monocytes: 7 %
Neutrophils Absolute: 4.4 10*3/uL (ref 1.4–7.0)
Neutrophils: 50 %
Platelets: 344 10*3/uL (ref 150–450)
RBC: 4.72 x10E6/uL (ref 3.77–5.28)
RDW: 15.7 % — ABNORMAL HIGH (ref 11.7–15.4)
WBC: 8.7 10*3/uL (ref 3.4–10.8)

## 2023-04-12 LAB — COMPREHENSIVE METABOLIC PANEL
ALT: 25 IU/L (ref 0–32)
AST: 23 IU/L (ref 0–40)
Albumin/Globulin Ratio: 1.5 (ref 1.2–2.2)
Albumin: 4.5 g/dL (ref 3.9–4.9)
Alkaline Phosphatase: 108 IU/L (ref 44–121)
BUN/Creatinine Ratio: 27 (ref 12–28)
BUN: 23 mg/dL (ref 8–27)
Bilirubin Total: 0.4 mg/dL (ref 0.0–1.2)
CO2: 23 mmol/L (ref 20–29)
Calcium: 10 mg/dL (ref 8.7–10.3)
Chloride: 98 mmol/L (ref 96–106)
Creatinine, Ser: 0.84 mg/dL (ref 0.57–1.00)
Globulin, Total: 3.1 g/dL (ref 1.5–4.5)
Glucose: 179 mg/dL — ABNORMAL HIGH (ref 70–99)
Potassium: 5 mmol/L (ref 3.5–5.2)
Sodium: 139 mmol/L (ref 134–144)
Total Protein: 7.6 g/dL (ref 6.0–8.5)
eGFR: 76 mL/min/{1.73_m2} (ref >59)

## 2023-04-12 LAB — HEMOGLOBIN A1C
Est. average glucose Bld gHb Est-mCnc: 171 mg/dL
Hgb A1c MFr Bld: 7.6 % — ABNORMAL HIGH (ref 4.8–5.6)

## 2023-04-12 LAB — MICROALBUMIN / CREATININE URINE RATIO
Creatinine, Urine: 69.5 mg/dL
Microalb/Creat Ratio: 41 mg/g creat — ABNORMAL HIGH (ref 0–29)
Microalbumin, Urine: 28.5 ug/mL

## 2023-04-12 LAB — LIPID PANEL
Chol/HDL Ratio: 3.5 ratio (ref 0.0–4.4)
Cholesterol, Total: 143 mg/dL (ref 100–199)
HDL: 41 mg/dL (ref >39)
LDL Chol Calc (NIH): 59 mg/dL (ref 0–99)
Triglycerides: 269 mg/dL — ABNORMAL HIGH (ref 0–149)
VLDL Cholesterol Cal: 43 mg/dL — ABNORMAL HIGH (ref 5–40)

## 2023-04-12 LAB — CARDIOVASCULAR RISK ASSESSMENT

## 2023-04-13 ENCOUNTER — Other Ambulatory Visit: Payer: Self-pay

## 2023-04-13 ENCOUNTER — Other Ambulatory Visit: Payer: Self-pay | Admitting: Family Medicine

## 2023-04-13 DIAGNOSIS — D509 Iron deficiency anemia, unspecified: Secondary | ICD-10-CM

## 2023-04-13 MED ORDER — OMEPRAZOLE 20 MG PO CPDR: 20.0000 mg | DELAYED_RELEASE_CAPSULE | Freq: Two times a day (BID) | ORAL | 1 refills | Status: DC

## 2023-04-13 MED ORDER — DAPAGLIFLOZIN PROPANEDIOL 5 MG PO TABS: 5.0000 mg | ORAL_TABLET | Freq: Every day | ORAL | 2 refills | Status: DC

## 2023-04-13 MED ORDER — ATORVASTATIN CALCIUM 20 MG PO TABS: 20.0000 mg | ORAL_TABLET | Freq: Every day | ORAL | 1 refills | Status: DC

## 2023-04-14 LAB — IRON,TIBC AND FERRITIN PANEL
Ferritin: 13 ng/mL — ABNORMAL LOW (ref 15–150)
Iron Saturation: 7 % — CL (ref 15–55)
Iron: 32 ug/dL (ref 27–139)
Total Iron Binding Capacity: 485 ug/dL — ABNORMAL HIGH (ref 250–450)
UIBC: 453 ug/dL — ABNORMAL HIGH (ref 118–369)

## 2023-04-14 LAB — MAGNESIUM: Magnesium: 1.7 mg/dL (ref 1.6–2.3)

## 2023-04-14 LAB — SPECIMEN STATUS REPORT

## 2023-04-19 ENCOUNTER — Ambulatory Visit (INDEPENDENT_AMBULATORY_CARE_PROVIDER_SITE_OTHER): Payer: PPO

## 2023-04-19 ENCOUNTER — Telehealth: Payer: PPO

## 2023-04-19 ENCOUNTER — Telehealth: Payer: Self-pay

## 2023-04-19 DIAGNOSIS — E1121 Type 2 diabetes mellitus with diabetic nephropathy: Secondary | ICD-10-CM

## 2023-04-19 DIAGNOSIS — F4321 Adjustment disorder with depressed mood: Secondary | ICD-10-CM

## 2023-04-19 DIAGNOSIS — F332 Major depressive disorder, recurrent severe without psychotic features: Secondary | ICD-10-CM

## 2023-04-19 DIAGNOSIS — I4891 Unspecified atrial fibrillation: Secondary | ICD-10-CM

## 2023-04-19 DIAGNOSIS — I480 Paroxysmal atrial fibrillation: Secondary | ICD-10-CM

## 2023-04-19 NOTE — Chronic Care Management (AMB) (Signed)
Chronic Care Management   CCM RN Visit Note  04/19/2023 Name: Rachel Hensley MRN: [id number] DOB: Dec 01, 1956  Subjective: Rachel Hensley is a 67 y.o. year old female who is a primary care patient of Cox, Kirsten, MD. The patient was referred to the Chronic Care Management team for assistance with care management needs subsequent to provider initiation of CCM services and plan of care.    Today's Visit:  Engaged with patient by telephone for follow up visit.        Goals Addressed             This Visit's Progress    CCM (depression and grief) EXPECTED OUTCOME: MONITOR, SELF-MANAGE AND REDUCE SYMPTOMS OF Depression and Grief       Current Barriers:  Care Coordination needs related to Grief[employer] support and resources in a patient with depression and grief Chronic Disease Management support and education needs related to effective management of depression and grief  Planned Interventions: Evaluation of current treatment plan related to depression and grief and patient's adherence to plan as established by provider. The patient feels she is doing okay right now with her depression and grief.  Doing well with her depression and grief. The patient is stable and denies any new issues related to her grief and depression. Has good support system. Has had some issues with her sinuses but is doing better. She has been going to her granddaughters soccer games and she is happy she gets to do this.  Advised patient to call the office for changes in mood, anxiety, depression, or acute changes in mental health Provided education to patient re: about the GriefShare and the availability of grief support due to the loss of her husband, mom, brother, and other family members over the last few years. Review of the GriefShare program and the resources available Reviewed scheduled/upcoming provider appointments including 07-29-2023 Discussed plans with patient for ongoing care management follow up and  provided patient with direct contact information for care management team Advised patient to discuss changes in her mental health, depression and grief with provider. Reflective listening and support given Screening for signs and symptoms of depression related to chronic disease state  Assessed social determinant of health barriers The patient is going to call the office and see if she can come and do a urine sample. She things she my have a UTI or a bladder infection. She states that she has been having some hesitancy and pain when urinating. She also states it has a foul odor. Education on calling the office and making sure she gets recommendations from Dr. Sedalia Muta. She states that she had one back in December. Review of worse and monitoring for acute changes in her condition. The patient states she takes showers.   Symptom Management: Attend all scheduled provider appointments Attend church or other social activities Work with the social worker to address care coordination needs and will continue to work with the clinical team to address health care and disease management related needs call the Suicide and Crisis Lifeline: 988 call the [place] National Suicide Prevention Lifeline: (206) 220-4259 or TTY: 262-611-9758 TTY 431 084 2540) to talk to a trained counselor call 1-800-273-TALK (toll free, 24 hour hotline) if experiencing a Mental Health or Behavioral Health Crisis   Follow Up Plan: Telephone follow up appointment with care management team member scheduled for: 06-07-2023 at 0900 am       CCM Expected Outcome:  Monitor, Self-Manage and Reduce Symptoms of Afib  Current Barriers:  Knowledge Deficits related to AFIB and recent hospitalization for new onset of AFIB Care Coordination needs related to resources and management of AFIB in a patient with new onset of AFIB with hospitalization for AFIB with [facility] Chronic Disease Management support and education needs related to effective management  of AFIB  Planned Interventions: Provider order and care plan reviewed. The patient feels that the current plan for effective management of her Afib is working well for her. She states that her AFIB is stable. Saw cardiologist in February and will see again in 6 months.  She is still having a little bit of trouble with her Magnesium level and is trying to keep it above 1.6. Education and support provided. Overall no new concerns with her AFIB or heart health. Counseled on increased risk of stroke due to Afib and benefits of anticoagulation for stroke prevention           Reviewed importance of adherence to anticoagulant exactly as prescribed. Is taking medications as directed and is also taking Magnesium 1600 mg daily. The cardiologist does not like this high dose but is trying to keep her levels up of magnesium. Is being followed closely. Advised patient to discuss changes in her heart rate, changes in her sx and sx, questions and concerns with provider Counseled on bleeding risk associated with AFIB and importance of self-monitoring for signs/symptoms of bleeding Counseled on avoidance of NSAIDs due to increased bleeding risk with anticoagulants Counseled on importance of regular laboratory monitoring as prescribed. Had repeat labs last week for check on Magnesium levels Counseled on seeking medical attention after a head injury or if there is blood in the urine/stool Afib action plan reviewed. The patient is taking Eliquis 5 mg QD. She also has Cardizem to take prn. She has parameters to take the Cardizem. She is also taking Magnesium that is helping her as her Magnesium was low.  Screening for signs and symptoms of depression related to chronic disease state Assessed social determinant of health barriers  Symptom Management: Take medications as prescribed   Attend all scheduled provider appointments Call provider office for new concerns or questions  call the Suicide and Crisis Lifeline:  988 call the [place] National Suicide Prevention Lifeline: 551 241 2318 or TTY: (867)603-3384 TTY 819-513-4149) to talk to a trained counselor call 1-800-273-TALK (toll free, 24 hour hotline) if experiencing a Mental Health or Behavioral Health Crisis  - check pulse (heart) rate before taking medicine - make a plan to eat healthy - keep all lab appointments - take medicine as prescribed  Follow Up Plan: Telephone follow up appointment with care management team member scheduled for: 06-07-2023 at 0900 am       CCM Expected Outcome:  Monitor, Self-Manage and Reduce Symptoms of Diabetes       Current Barriers:  Care Coordination needs related to loss of job and inability to pay for medications in a patient with DM Chronic Disease Management support and education needs related to effective management of DM Financial Constraints- working with pharm D for PAP assistance Difficulty obtaining medications  Lab Results  Component Value Date   HGBA1C 7.6 (H) 04/09/2023  Las in December 2023 6.9   Planned Interventions: Provided education to patient about basic DM disease process. Patients A1C is elevated with most recent visit. The patient saw pcp earlier in the month and had changes in medications. Is compliant with the plan of care. Education and support given; Reviewed medications with patient and discussed importance of  medication adherence. The patient was approved for Ozempic and is on 0.5mg . She is thankful that she gets financial assistance for Ozempic through 2024. The pcp has increased the dose of her Ozempic to 1 mg but wants to make sure she can get it before she increases from 0.5 to 1 mg. Is taking as prescribed. There have been other changes in medications and she is working on getting her blood sugars and A1C down Reviewed prescribed diet with patient heart healthy/ADA diet. Review and education. The patient is eating a snack before bed. She states that she is not hungry and sometimes  she forgets to eat until it is late.  Counseled on importance of regular laboratory monitoring as prescribed. Has regular lab work ;        Discussed plans with patient for ongoing care management follow up and provided patient with direct contact information for care management team;      Provided patient with written educational materials related to hypo and hyperglycemia and importance of correct treatment. The highest she has seen lately is 170 recently     Reviewed scheduled/upcoming provider appointments including: 07-29-2023  Advised patient, providing education and rationale, to check cbg when you have symptoms of low or high blood sugar and as directed   and record. The patient checks her blood sugars. This am her blood sugar was 155 and yesterday it was 170. She is hoping they will start stabilizing out. Review of goal of fasting <130 and post prandial of <180     call provider for findings outside established parameters;       Referral made to pharmacy team for assistance with financial difficulties of being able to afford medications for effective management of DM. Has been approved for Ozempic through 2024 and is thankful for this. Works with pharm D on a regular basis;       Referral made to social work team for assistance with financial barriers, grief and depression. Has been working with the LCSW;      Review of patient status, including review of consultants reports, relevant laboratory and other test results, and medications completed;       Advised patient to discuss changes in her DM health and well being with provider;      Screening for signs and symptoms of depression related to chronic disease state;        Assessed social determinant of health barriers;       Patient to schedule an appointment with the eye doctor after the first of the year.  Symptom Management: Take medications as prescribed   Attend all scheduled provider appointments Call pharmacy for medication refills  3-7 days in advance of running out of medications Call provider office for new concerns or questions  Work with the social worker to address care coordination needs and will continue to work with the clinical team to address health care and disease management related needs call the Suicide and Crisis Lifeline: 988 call the [place] National Suicide Prevention Lifeline: 985-430-5001 or TTY: 412-239-4598 TTY 7786498037) to talk to a trained counselor call 1-800-273-TALK (toll free, 24 hour hotline) if experiencing a Mental Health or Behavioral Health Crisis  schedule appointment with eye doctor check feet daily for cuts, sores or redness trim toenails straight across fill half of plate with vegetables manage portion size prepare main meal at home 3 to 5 days each week wash and dry feet carefully every day wear comfortable, cotton socks wear comfortable, well-fitting shoes  Follow Up Plan: Telephone follow up appointment with care management team member scheduled for: 06-07-2023 at 0900 am       CCM Expected Outcome:  Monitor, Self-Manage, and Reduce Symptoms of Hypertension       Current Barriers:  Chronic Disease Management support and education needs related to effective management of DM Financial Constraints.  BP Readings from Last 3 Encounters:  04/09/23 118/72  02/19/23 128/68  01/26/23 132/70     Planned Interventions: Evaluation of current treatment plan related to hypertension self management and patient's adherence to plan as established by provider. The patient is compliant with the plan of care. Denies any new concerns with HTN. Blood pressures are stable. Is stable right now with her Afib. Saw the cardiologist in January and February. Had adjustment in medications. Feels like she is doing well. Will not see the cardiologist again for 6 months unless there are issues;   Provided education to patient re: stroke prevention, s/s of heart attack and stroke; Reviewed prescribed  diet heart healthy/ADA diet. The patient is eating healthy. Sometimes eats late due to no appetite from the medications she is taking for her DM. Review of healthy snacks. The patient has been having issues with keeping her magnesium level up. Magnesium is causing her to have diarrhea but that is better since she is taking her iron every day. Education on watching for dietary changes and making sure she is following a heart healthy/ADA diet. Reviewed medications with patient and discussed importance of compliance. The patient is compliant with medications. Works with pharm D for medication assistance and support. Patient got approved for help with medication cost. Ongoing support and education from the pharm D.;  Discussed plans with patient for ongoing care management follow up and provided patient with direct contact information for care management team; Advised patient, providing education and rationale, to monitor blood pressure daily and record, calling PCP for findings outside established parameters;  Advised patient to discuss changes in blood pressure or other heart concerns  with provider; Provided education on prescribed diet heart healthy/ADA diet. Education on foods high in Magnesium: fish, greens, nuts, vegetables, beans ;  Discussed complications of poorly controlled blood pressure such as heart disease, stroke, circulatory complications, vision complications, kidney impairment, sexual dysfunction;  Next pcp appointment on 07-29-2023. Saw cardiologist on 02-19-2023. Denies any new needs at this time.   Symptom Management: Take medications as prescribed   Attend all scheduled provider appointments Call pharmacy for medication refills 3-7 days in advance of running out of medications Call provider office for new concerns or questions  call the Suicide and Crisis Lifeline: 988 call the [place] National Suicide Prevention Lifeline: (226) 220-1752 or TTY: (407) 486-6575 TTY 5647067987) to talk to  a trained counselor call 1-800-273-TALK (toll free, 24 hour hotline) if experiencing a Mental Health or Behavioral Health Crisis  check blood pressure weekly learn about high blood pressure call doctor for signs and symptoms of high blood pressure develop an action plan for high blood pressure keep all doctor appointments take medications for blood pressure exactly as prescribed report new symptoms to your doctor  Follow Up Plan: Telephone follow up appointment with care management team member scheduled for: 06-07-2023 at 0900 am          Plan:Telephone follow up appointment with care management team member scheduled for:  06-07-2023 at 0900 am  Alto Denver RN, MSN, CCM RN Care Manager  Chronic Care Management Direct Number: [phone]

## 2023-04-19 NOTE — Telephone Encounter (Signed)
Pt called today to request a same day appointment for the following symptoms:UTI. Unfortunately our schedule is full and we have no openings for today or tomorrow. Pt was notified to go to Urgent Care.

## 2023-04-19 NOTE — Patient Instructions (Addendum)
Please call the care guide team at [phone] if you need to cancel or reschedule your appointment.   If you are experiencing a Mental Health or Behavioral Health Crisis or need someone to talk to, please call the Suicide and Crisis Lifeline: 988 call the [place] National Suicide Prevention Lifeline: 920-236-3550 or TTY: (430) 618-9197 TTY (240)029-3470) to talk to a trained counselor call 1-800-273-TALK (toll free, 24 hour hotline) go to [facility] [address] [place] [phone])   Following is a copy of the CCM Program Consent:  CCM service includes personalized support from designated clinical staff supervised by the physician, including individualized plan of care and coordination with other care providers 24/7 contact phone numbers for assistance for urgent and routine care needs. Service will only be billed when office clinical staff spend 20 minutes or more in a month to coordinate care. Only one practitioner may furnish and bill the service in a calendar month. The patient may stop CCM services at amy time (effective at the end of the month) by phone call to the office staff. The patient will be responsible for cost sharing (co-pay) or up to 20% of the service fee (after annual deductible is met)  Following is a copy of your full provider care plan:   Goals Addressed             This Visit's Progress    CCM (depression and grief) EXPECTED OUTCOME: MONITOR, SELF-MANAGE AND REDUCE SYMPTOMS OF Depression and Grief       Current Barriers:  Care Coordination needs related to GriefShare support and resources in a patient with depression and grief Chronic Disease Management support and education needs related to effective management of depression and grief  Planned Interventions: Evaluation of current treatment plan related to depression and grief and patient's adherence to plan as established by provider. The patient feels she is doing okay  right now with her depression and grief.  Doing well with her depression and grief. The patient is stable and denies any new issues related to her grief and depression. Has good support system. Has had some issues with her sinuses but is doing better. She has been going to her granddaughters soccer games and she is happy she gets to do this.  Advised patient to call the office for changes in mood, anxiety, depression, or acute changes in mental health Provided education to patient re: about the GriefShare and the availability of grief support due to the loss of her husband, mom, brother, and other family members over the last few years. Review of the GriefShare program and the resources available Reviewed scheduled/upcoming provider appointments including 07-29-2023 Discussed plans with patient for ongoing care management follow up and provided patient with direct contact information for care management team Advised patient to discuss changes in her mental health, depression and grief with provider. Reflective listening and support given Screening for signs and symptoms of depression related to chronic disease state  Assessed social determinant of health barriers The patient is going to call the office and see if she can come and do a urine sample. She things she my have a UTI or a bladder infection. She states that she has been having some hesitancy and pain when urinating. She also states it has a foul odor. Education on calling the office and making sure she gets recommendations from Dr. Sedalia Muta. She states that she had one back in December. Review of worse and monitoring for acute changes in her condition.  The patient states she takes showers.   Symptom Management: Attend all scheduled provider appointments Attend church or other social activities Work with the social worker to address care coordination needs and will continue to work with the clinical team to address health care and disease management  related needs call the Suicide and Crisis Lifeline: 988 call the [place] National Suicide Prevention Lifeline: 819-743-6224 or TTY: 859-594-5627 TTY 8593750273) to talk to a trained counselor call 1-800-273-TALK (toll free, 24 hour hotline) if experiencing a Mental Health or Behavioral Health Crisis   Follow Up Plan: Telephone follow up appointment with care management team member scheduled for: 06-07-2023 at 0900 am       CCM Expected Outcome:  Monitor, Self-Manage and Reduce Symptoms of Afib       Current Barriers:  Knowledge Deficits related to AFIB and recent hospitalization for new onset of AFIB Care Coordination needs related to resources and management of AFIB in a patient with new onset of AFIB with hospitalization for AFIB with RVR Chronic Disease Management support and education needs related to effective management of AFIB  Planned Interventions: Provider order and care plan reviewed. The patient feels that the current plan for effective management of her Afib is working well for her. She states that her AFIB is stable. Saw cardiologist in February and will see again in 6 months.  She is still having a little bit of trouble with her Magnesium level and is trying to keep it above 1.6. Education and support provided. Overall no new concerns with her AFIB or heart health. Counseled on increased risk of stroke due to Afib and benefits of anticoagulation for stroke prevention           Reviewed importance of adherence to anticoagulant exactly as prescribed. Is taking medications as directed and is also taking Magnesium 1600 mg daily. The cardiologist does not like this high dose but is trying to keep her levels up of magnesium. Is being followed closely. Advised patient to discuss changes in her heart rate, changes in her sx and sx, questions and concerns with provider Counseled on bleeding risk associated with AFIB and importance of self-monitoring for signs/symptoms of bleeding Counseled  on avoidance of NSAIDs due to increased bleeding risk with anticoagulants Counseled on importance of regular laboratory monitoring as prescribed. Had repeat labs last week for check on Magnesium levels Counseled on seeking medical attention after a head injury or if there is blood in the urine/stool Afib action plan reviewed. The patient is taking Eliquis 5 mg QD. She also has Cardizem to take prn. She has parameters to take the Cardizem. She is also taking Magnesium that is helping her as her Magnesium was low.  Screening for signs and symptoms of depression related to chronic disease state Assessed social determinant of health barriers  Symptom Management: Take medications as prescribed   Attend all scheduled provider appointments Call provider office for new concerns or questions  call the Suicide and Crisis Lifeline: 988 call the [place] National Suicide Prevention Lifeline: 419-644-3493 or TTY: (323)312-0064 TTY (517)203-2567) to talk to a trained counselor call 1-800-273-TALK (toll free, 24 hour hotline) if experiencing a Mental Health or Behavioral Health Crisis  - check pulse (heart) rate before taking medicine - make a plan to eat healthy - keep all lab appointments - take medicine as prescribed  Follow Up Plan: Telephone follow up appointment with care management team member scheduled for: 06-07-2023 at 0900 am       CCM Expected  Outcome:  Monitor, Self-Manage and Reduce Symptoms of Diabetes       Current Barriers:  Care Coordination needs related to loss of job and inability to pay for medications in a patient with DM Chronic Disease Management support and education needs related to effective management of DM Financial Constraints- working with pharm D for PAP assistance Difficulty obtaining medications  Lab Results  Component Value Date   HGBA1C 7.6 (H) 04/09/2023  Las in December 2023 6.9   Planned Interventions: Provided education to patient about basic DM disease  process. Patients A1C is elevated with most recent visit. The patient saw pcp earlier in the month and had changes in medications. Is compliant with the plan of care. Education and support given; Reviewed medications with patient and discussed importance of medication adherence. The patient was approved for Ozempic and is on 0.5mg . She is thankful that she gets financial assistance for Ozempic through 2024. The pcp has increased the dose of her Ozempic to 1 mg but wants to make sure she can get it before she increases from 0.5 to 1 mg. Is taking as prescribed. There have been other changes in medications and she is working on getting her blood sugars and A1C down Reviewed prescribed diet with patient heart healthy/ADA diet. Review and education. The patient is eating a snack before bed. She states that she is not hungry and sometimes she forgets to eat until it is late.  Counseled on importance of regular laboratory monitoring as prescribed. Has regular lab work ;        Discussed plans with patient for ongoing care management follow up and provided patient with direct contact information for care management team;      Provided patient with written educational materials related to hypo and hyperglycemia and importance of correct treatment. The highest she has seen lately is 170 recently     Reviewed scheduled/upcoming provider appointments including: 07-29-2023  Advised patient, providing education and rationale, to check cbg when you have symptoms of low or high blood sugar and as directed   and record. The patient checks her blood sugars. This am her blood sugar was 155 and yesterday it was 170. She is hoping they will start stabilizing out. Review of goal of fasting <130 and post prandial of <180     call provider for findings outside established parameters;       Referral made to pharmacy team for assistance with financial difficulties of being able to afford medications for effective management of DM.  Has been approved for Ozempic through 2024 and is thankful for this. Works with pharm D on a regular basis;       Referral made to social work team for assistance with financial barriers, grief and depression. Has been working with the LCSW;      Review of patient status, including review of consultants reports, relevant laboratory and other test results, and medications completed;       Advised patient to discuss changes in her DM health and well being with provider;      Screening for signs and symptoms of depression related to chronic disease state;        Assessed social determinant of health barriers;       Patient to schedule an appointment with the eye doctor after the first of the year.  Symptom Management: Take medications as prescribed   Attend all scheduled provider appointments Call pharmacy for medication refills 3-7 days in advance of running out of  medications Call provider office for new concerns or questions  Work with the social worker to address care coordination needs and will continue to work with the clinical team to address health care and disease management related needs call the Suicide and Crisis Lifeline: 988 call the [place] National Suicide Prevention Lifeline: 831-038-8212 or TTY: 239-324-5015 TTY (925)059-7371) to talk to a trained counselor call 1-800-273-TALK (toll free, 24 hour hotline) if experiencing a Mental Health or Behavioral Health Crisis  schedule appointment with eye doctor check feet daily for cuts, sores or redness trim toenails straight across fill half of plate with vegetables manage portion size prepare main meal at home 3 to 5 days each week wash and dry feet carefully every day wear comfortable, cotton socks wear comfortable, well-fitting shoes  Follow Up Plan: Telephone follow up appointment with care management team member scheduled for: 06-07-2023 at 0900 am       CCM Expected Outcome:  Monitor, Self-Manage, and Reduce Symptoms of  Hypertension       Current Barriers:  Chronic Disease Management support and education needs related to effective management of DM Financial Constraints.  BP Readings from Last 3 Encounters:  04/09/23 118/72  02/19/23 128/68  01/26/23 132/70     Planned Interventions: Evaluation of current treatment plan related to hypertension self management and patient's adherence to plan as established by provider. The patient is compliant with the plan of care. Denies any new concerns with HTN. Blood pressures are stable. Is stable right now with her Afib. Saw the cardiologist in January and February. Had adjustment in medications. Feels like she is doing well. Will not see the cardiologist again for 6 months unless there are issues;   Provided education to patient re: stroke prevention, s/s of heart attack and stroke; Reviewed prescribed diet heart healthy/ADA diet. The patient is eating healthy. Sometimes eats late due to no appetite from the medications she is taking for her DM. Review of healthy snacks. The patient has been having issues with keeping her magnesium level up. Magnesium is causing her to have diarrhea but that is better since she is taking her iron every day. Education on watching for dietary changes and making sure she is following a heart healthy/ADA diet. Reviewed medications with patient and discussed importance of compliance. The patient is compliant with medications. Works with pharm D for medication assistance and support. Patient got approved for help with medication cost. Ongoing support and education from the pharm D.;  Discussed plans with patient for ongoing care management follow up and provided patient with direct contact information for care management team; Advised patient, providing education and rationale, to monitor blood pressure daily and record, calling PCP for findings outside established parameters;  Advised patient to discuss changes in blood pressure or other heart  concerns  with provider; Provided education on prescribed diet heart healthy/ADA diet. Education on foods high in Magnesium: fish, greens, nuts, vegetables, beans ;  Discussed complications of poorly controlled blood pressure such as heart disease, stroke, circulatory complications, vision complications, kidney impairment, sexual dysfunction;  Next pcp appointment on 07-29-2023. Saw cardiologist on 02-19-2023. Denies any new needs at this time.   Symptom Management: Take medications as prescribed   Attend all scheduled provider appointments Call pharmacy for medication refills 3-7 days in advance of running out of medications Call provider office for new concerns or questions  call the Suicide and Crisis Lifeline: 988 call the [place] National Suicide Prevention Lifeline: (714)844-6636 or TTY: 818-285-4284 TTY (734)816-2325) to  talk to a trained counselor call 1-800-273-TALK (toll free, 24 hour hotline) if experiencing a Mental Health or Behavioral Health Crisis  check blood pressure weekly learn about high blood pressure call doctor for signs and symptoms of high blood pressure develop an action plan for high blood pressure keep all doctor appointments take medications for blood pressure exactly as prescribed report new symptoms to your doctor  Follow Up Plan: Telephone follow up appointment with care management team member scheduled for: 06-07-2023 at 0900 am          Patient verbalizes understanding of instructions and care plan provided today and agrees to view in MyChart. Active MyChart status and patient understanding of how to access instructions and care plan via MyChart confirmed with patient.  Telephone follow up appointment with care management team member scheduled for: 06-07-2023 at 0900 am Urinary Tract Infection, Adult A urinary tract infection (UTI) is an infection of any part of the urinary tract. The urinary tract includes: The kidneys. The ureters. The bladder. The  urethra. These organs make, store, and get rid of pee (urine) in the body. What are the causes? This infection is caused by germs (bacteria) in your genital area. These germs grow and cause swelling (inflammation) of your urinary tract. What increases the risk? The following factors may make you more likely to develop this condition: Using a small, thin tube (catheter) to drain pee. Not being able to control when you pee or poop (incontinence). Being female. If you are female, these things can increase the risk: Using these methods to prevent pregnancy: A medicine that kills sperm (spermicide). A device that blocks sperm (diaphragm). Having low levels of a female hormone (estrogen). Being pregnant. You are more likely to develop this condition if: You have genes that add to your risk. You are sexually active. You take antibiotic medicines. You have trouble peeing because of: A prostate that is bigger than normal, if you are female. A blockage in the part of your body that drains pee from the bladder. A kidney stone. A nerve condition that affects your bladder. Not getting enough to drink. Not peeing often enough. You have other conditions, such as: Diabetes. A weak disease-fighting system (immune system). Sickle cell disease. Gout. Injury of the spine. What are the signs or symptoms? Symptoms of this condition include: Needing to pee right away. Peeing small amounts often. Pain or burning when peeing. Blood in the pee. Pee that smells bad or not like normal. Trouble peeing. Pee that is cloudy. Fluid coming from the vagina, if you are female. Pain in the belly or lower back. Other symptoms include: Vomiting. Not feeling hungry. Feeling mixed up (confused). This may be the first symptom in older adults. Being tired and grouchy (irritable). A fever. Watery poop (diarrhea). How is this treated? Taking antibiotic medicine. Taking other medicines. Drinking enough  water. In some cases, you may need to see a specialist. Follow these instructions at home:  Medicines Take over-the-counter and prescription medicines only as told by your doctor. If you were prescribed an antibiotic medicine, take it as told by your doctor. Do not stop taking it even if you start to feel better. General instructions Make sure you: Pee until your bladder is empty. Do not hold pee for a long time. Empty your bladder after sex. Wipe from front to back after peeing or pooping if you are a female. Use each tissue one time when you wipe. Drink enough fluid to keep your pee pale  yellow. Keep all follow-up visits. Contact a doctor if: You do not get better after 1-2 days. Your symptoms go away and then come back. Get help right away if: You have very bad back pain. You have very bad pain in your lower belly. You have a fever. You have chills. You feeling like you will vomit or you vomit. Summary A urinary tract infection (UTI) is an infection of any part of the urinary tract. This condition is caused by germs in your genital area. There are many risk factors for a UTI. Treatment includes antibiotic medicines. Drink enough fluid to keep your pee pale yellow. This information is not intended to replace advice given to you by your health care provider. Make sure you discuss any questions you have with your health care provider. Document Revised: 07/26/2020 Document Reviewed: 07/26/2020 Elsevier Patient Education  2023 [employer].

## 2023-04-22 ENCOUNTER — Telehealth: Payer: Self-pay | Admitting: Pharmacist

## 2023-04-22 NOTE — Progress Notes (Signed)
[facility] Customer service manager [facility])                                            [facility] Quality Pharmacy Team    04/22/2023  Rachel Hensley 12/01/1956 [id number]  Placed telephone call to patient. Advised patient that Noreene Larsson will be sending out application for Synjardy as Dr. Sedalia Muta has approved this. Patient is aware that she is to remain on Farxiga and metformin until she has Synjardy in hand.  Patient states she received a letter from [employer] regarding missing information. Noreene Larsson will place patient on call list for [employer] next week and inquire about details. Patient was appreciative of my call and know that [facility] will be in touch.   Reynold Bowen, PharmD [facility]  [facility] Clinical Pharmacist Direct Dial: [phone]

## 2023-04-27 ENCOUNTER — Telehealth: Payer: Self-pay | Admitting: Pharmacy Technician

## 2023-04-27 DIAGNOSIS — F4321 Adjustment disorder with depressed mood: Secondary | ICD-10-CM

## 2023-04-27 DIAGNOSIS — F332 Major depressive disorder, recurrent severe without psychotic features: Secondary | ICD-10-CM

## 2023-04-27 DIAGNOSIS — I4891 Unspecified atrial fibrillation: Secondary | ICD-10-CM

## 2023-04-27 DIAGNOSIS — I480 Paroxysmal atrial fibrillation: Secondary | ICD-10-CM

## 2023-04-27 DIAGNOSIS — E1121 Type 2 diabetes mellitus with diabetic nephropathy: Secondary | ICD-10-CM

## 2023-04-27 DIAGNOSIS — Z5986 Financial insecurity: Secondary | ICD-10-CM

## 2023-04-27 NOTE — Progress Notes (Signed)
[facility] Customer service manager [facility])                                            [facility] Quality Pharmacy Team    04/27/2023  LONYA JOHANNESEN 06-Jul-1956 [id number]  Care coordination call placed to Novo Nordisk at the request of [facility] PharmD Tiffany Benfield. Tiffany informs patient's dose of Ozempic had increased to 1mg  SQ weekly and that the paitent received a letter that the prescription that was sent in on 04/09/23 by the office was missing information.  Spoke to [employer] at [employer] who informs a retail prescription was received on 04/09/23. However, [employer] requires that the change request form be utilized for dose changes. She informs the form can be found on their website.  Faxed change request form to Dr. Renea Ee office today with instructions for the staff to complete the form and have Dr Cox sign. The form can then  either be  faxed back to [facility] for submission to [employer] or they can fax back to [employer] directly.  Firas Guardado P. Honest Safranek, CPhT [facility] [employer]  [phone]

## 2023-04-29 ENCOUNTER — Other Ambulatory Visit: Payer: Self-pay

## 2023-04-29 DIAGNOSIS — R600 Localized edema: Secondary | ICD-10-CM

## 2023-04-29 MED ORDER — FUROSEMIDE 20 MG PO TABS: 20.0000 mg | ORAL_TABLET | Freq: Every day | ORAL | 1 refills | Status: DC

## 2023-04-30 ENCOUNTER — Telehealth: Payer: Self-pay | Admitting: Pharmacy Technician

## 2023-04-30 DIAGNOSIS — Z5986 Financial insecurity: Secondary | ICD-10-CM

## 2023-04-30 NOTE — Progress Notes (Signed)
[facility] Customer service manager [facility])                                            [facility] Quality Pharmacy Team    04/30/2023  Rachel Hensley Dec 09, 1956 [id number]                                      Medication Assistance Referral  Referral From: [facility] RPh Rachel B. Medication/Company: [name] / BI Patient application portion:  Mailed Provider application portion: Faxed  to Dr. Blane Hensley Provider address/fax verified via: Office website  Rachel Hensley P. Rachel Hensley, CPhT [facility] [employer]  [phone]

## 2023-05-04 ENCOUNTER — Other Ambulatory Visit (HOSPITAL_COMMUNITY): Payer: Self-pay

## 2023-05-05 ENCOUNTER — Ambulatory Visit
Admission: RE | Admit: 2023-05-05 | Discharge: 2023-05-05 | Disposition: A | Discharge status: Home / Self Care | Payer: PPO | Source: Ambulatory Visit | Attending: Family Medicine | Admitting: Family Medicine

## 2023-05-05 DIAGNOSIS — Z1231 Encounter for screening mammogram for malignant neoplasm of breast: Secondary | ICD-10-CM

## 2023-05-07 ENCOUNTER — Other Ambulatory Visit: Payer: Self-pay

## 2023-05-07 MED ORDER — ICOSAPENT ETHYL 1 G PO CAPS
2.0000 g | ORAL_CAPSULE | Freq: Two times a day (BID) | ORAL | 1 refills | Status: DC
  Filled 2023-05-07: qty 360, 90d supply, fill #0
  Filled 2023-08-05: qty 360, 90d supply, fill #1

## 2023-05-11 ENCOUNTER — Ambulatory Visit: Payer: PPO

## 2023-05-11 LAB — HM DIABETES EYE EXAM

## 2023-05-13 ENCOUNTER — Telehealth: Payer: Self-pay

## 2023-05-13 NOTE — Telephone Encounter (Signed)
-----   Message from Daniel Nones sent at 05/13/2023  3:29 PM EDT ----- Regarding: Non [employer] Novo Nordisk patient assistance program notification:  120- day supply of Ozempic  should arrive to the office in 10-14 business days. Patient has 1  refill remaining and enrollment will expire on 12/28/2023. Next refill will be fulfilled on 07/30/2023.    Billee Cashing, CMA Clinical Pharmacist Assistant [phone]

## 2023-05-20 ENCOUNTER — Telehealth: Payer: Self-pay | Admitting: Pharmacy Technician

## 2023-05-20 ENCOUNTER — Telehealth: Payer: Self-pay

## 2023-05-20 DIAGNOSIS — Z5986 Financial insecurity: Secondary | ICD-10-CM

## 2023-05-20 NOTE — Progress Notes (Signed)
[facility])                                            [facility] Quality Pharmacy Team    05/20/2023  Rachel Hensley 1956/05/26 [id number]  Received both patient and provider portion(s) of patient assistance application(s) for Synjardy XR. Faxed completed application and required documents into BI.    Daanish Copes P. Magnum Lunde, CPhT [facility] [employer]  [phone]

## 2023-05-20 NOTE — Telephone Encounter (Signed)
Notified of patient assistance arrival. Ozempic Lot # [id number] Exp. 10/27/2025.

## 2023-05-21 NOTE — Telephone Encounter (Signed)
Patient pick up medication Ozempic patient assistant.

## 2023-05-26 ENCOUNTER — Other Ambulatory Visit: Payer: Self-pay | Admitting: Family Medicine

## 2023-05-26 MED ORDER — METFORMIN HCL 1000 MG PO TABS: 1000.0000 mg | ORAL_TABLET | Freq: Two times a day (BID) | ORAL | 0 refills | Status: DC

## 2023-05-26 MED ORDER — GLIMEPIRIDE 4 MG PO TABS: 4.0000 mg | ORAL_TABLET | Freq: Two times a day (BID) | ORAL | 0 refills | Status: DC

## 2023-05-31 ENCOUNTER — Telehealth: Payer: Self-pay | Admitting: Pharmacist

## 2023-05-31 ENCOUNTER — Telehealth: Payer: Self-pay | Admitting: Pharmacy Technician

## 2023-05-31 ENCOUNTER — Telehealth: Payer: Self-pay

## 2023-05-31 DIAGNOSIS — Z5986 Financial insecurity: Secondary | ICD-10-CM

## 2023-05-31 NOTE — Progress Notes (Signed)
[facility] Customer service manager [facility]) Quality Pharmacy Team [facility]   05/31/2023  Rachel Hensley [birth year] [id number]  Reason for referral: Medication Assistance  Referral source:  CPhT Current insurance: [employer]  Reason for call: Called to provide update on status of Synjardy approval and shipment.   Outreach:  Unsuccessful telephone call attempt #1 to patient.   HIPAA compliant voicemail left requesting a return call  Plan:  -I will make another outreach attempt to patient within 3-4 business days.  Thank you for allowing [facility] pharmacy to be a part of this patient's care.   Reynold Bowen, PharmD [facility]  [facility] Clinical Pharmacist Direct Dial: [phone]

## 2023-05-31 NOTE — Progress Notes (Signed)
[facility] Customer service manager [facility])                                            [facility] Quality Pharmacy Team    05/31/2023  ARLY BLEE [birth year] [id number]    Received call back from Ms. Schwanz. I have informed patient that she has been approved for the [employer] patient assistance program for Synjardy XR 05/20/23-12/28/23. I advised Ms. Lusky that prior to starting [place], she will need to stop metformin and [place]. Patient verbalized understanding.  Initial shipment of medication will process automatically and be shipped to patient's home. Subsequent refills will need to be called into BI by the patient by calling [phone]. I have advised Ms. Burket to call [facility] about 14 days prior to being out of the medication to allow time for processing and shipping. Patient verbalized understanding.   While on the phone with Ms. Verda Cumins, we discussed the cost of Eliquis. Patient informs that the cost of the medication has increased the last time she got it filled. I assessed her for patient assistance today and will begin the application process for assistance through [employer].    Reynold Bowen, PharmD [facility]  [facility] Clinical Pharmacist Direct Dial: [phone]

## 2023-05-31 NOTE — Telephone Encounter (Signed)
Patient Assistance called stating that They are sending the Synjardy XR out to patient as Synjardy XR 12.5mg -1000mg  2 tablets daily and just wanted to ensure that the patient's other medication that she was taking in place of this he mentioned either jardiance or farxiga would be stopped. He states that they are sending out to patient and it would be 2-4 days before arrival. I did not see the jardiance or the Synjardy XR in the current medication list so I was sending you this message to inform you that patient is receiving the medication and that it wasn't on the current medication list.

## 2023-05-31 NOTE — Progress Notes (Signed)
[facility])                                            [facility] Quality Pharmacy Team    05/31/2023  TATUMN SWIECICKI 04-17-1956 [id number]  Care coordination call placed to [facility] in regard to Synjardy XR  application.  Spoke to [employer] who informs patient is APPROVED 05/20/23-12/28/23. Initial shipment of medication will process automatically and be shipped to patient's home. Subsequent refills will need to be called into BI by the patient by calling [phone].  Natacha Jepsen P. Linde Wilensky, CPhT [facility] [employer]  [phone]

## 2023-06-02 ENCOUNTER — Ambulatory Visit (INDEPENDENT_AMBULATORY_CARE_PROVIDER_SITE_OTHER): Payer: PPO

## 2023-06-02 VITALS — BP 118/62 | HR 77 | Resp 16 | Ht 63.0 in | Wt 158.0 lb

## 2023-06-02 DIAGNOSIS — N959 Unspecified menopausal and perimenopausal disorder: Secondary | ICD-10-CM

## 2023-06-02 DIAGNOSIS — Z0000 Encounter for general adult medical examination without abnormal findings: Secondary | ICD-10-CM

## 2023-06-02 DIAGNOSIS — Z9189 Other specified personal risk factors, not elsewhere classified: Secondary | ICD-10-CM | POA: Diagnosis not present

## 2023-06-02 NOTE — Patient Instructions (Signed)
Rachel Hensley , Thank you for taking time to come for your Medicare Wellness Visit. I appreciate your ongoing commitment to your health goals. Please review the following plan we discussed and let me know if I can assist you in the future.   This is a list of the screening recommended for you and due dates:  Health Maintenance  Topic Date Due   Hepatitis C Screening  Never done   DTaP/Tdap/Td vaccine (1 - Tdap) Never done   COVID-19 Vaccine (1) 07/29/2023*   Zoster (Shingles) Vaccine (1 of 2) 06/01/2024*   Flu Shot  07/29/2023   Hemoglobin A1C  10/09/2023   Yearly kidney function blood test for diabetes  04/08/2024   Yearly kidney health urinalysis for diabetes  04/08/2024   Complete foot exam   04/08/2024   Mammogram  05/04/2024   Eye exam for diabetics  05/10/2024   Medicare Annual Wellness Visit  06/01/2024   Colon Cancer Screening  03/31/2026   Pneumonia Vaccine  Completed   DEXA scan (bone density measurement)  Completed   HPV Vaccine  Aged Out  *Topic was postponed. The date shown is not the original due date.    Advanced directives: Please bring a copy for your medical record  Conditions/risks identified: Let me know if you don't hear anything about getting Eliquis patient assistance   Preventive Care 65 Years and Older, Female Preventive care refers to lifestyle choices and visits with your health care provider that can promote health and wellness. What does preventive care include? A yearly physical exam. This is also called an annual well check. Dental exams once or twice a year. Routine eye exams. Ask your health care provider how often you should have your eyes checked. Personal lifestyle choices, including: Daily care of your teeth and gums. Regular physical activity. Eating a healthy diet. Avoiding tobacco and drug use. Limiting alcohol use. Practicing safe sex. Taking low-dose aspirin every day. Taking vitamin and mineral supplements as recommended by your  health care provider. What happens during an annual well check? The services and screenings done by your health care provider during your annual well check will depend on your age, overall health, lifestyle risk factors, and family history of disease. Counseling  Your health care provider may ask you questions about your: Alcohol use. Tobacco use. Drug use. Emotional well-being. Home and relationship well-being. Sexual activity. Eating habits. History of falls. Memory and ability to understand (cognition). Work and work Astronomer. Reproductive health. Screening  You may have the following tests or measurements: Height, weight, and BMI. Blood pressure. Lipid and cholesterol levels. These may be checked every 5 years, or more frequently if you are over 36 years old. Skin check. Lung cancer screening. You may have this screening every year starting at age 85 if you have a 30-pack-year history of smoking and currently smoke or have quit within the past 15 years. Fecal occult blood test (FOBT) of the stool. You may have this test every year starting at age 65. Flexible sigmoidoscopy or colonoscopy. You may have a sigmoidoscopy every 5 years or a colonoscopy every 10 years starting at age 70. Hepatitis C blood test. Hepatitis B blood test. Sexually transmitted disease (STD) testing. Diabetes screening. This is done by checking your blood sugar (glucose) after you have not eaten for a while (fasting). You may have this done every 1-3 years. Bone density scan. This is done to screen for osteoporosis. You may have this done starting at age 41. Mammogram. This  may be done every 1-2 years. Talk to your health care provider about how often you should have regular mammograms. Talk with your health care provider about your test results, treatment options, and if necessary, the need for more tests. Vaccines  Your health care provider may recommend certain vaccines, such as: Influenza vaccine.  This is recommended every year. Tetanus, diphtheria, and acellular pertussis (Tdap, Td) vaccine. You may need a Td booster every 10 years. Zoster vaccine. You may need this after age 25. Pneumococcal 13-valent conjugate (PCV13) vaccine. One dose is recommended after age 15. Pneumococcal polysaccharide (PPSV23) vaccine. One dose is recommended after age 52. Talk to your health care provider about which screenings and vaccines you need and how often you need them. This information is not intended to replace advice given to you by your health care provider. Make sure you discuss any questions you have with your health care provider. Document Released: 01/10/2016 Document Revised: 09/02/2016 Document Reviewed: 10/15/2015 Elsevier Interactive Patient Education  2017 [employer].  Fall Prevention in the Home Falls can cause injuries. They can happen to people of all ages. There are many things you can do to make your home safe and to help prevent falls. What can I do on the outside of my home? Regularly fix the edges of walkways and driveways and fix any cracks. Remove anything that might make you trip as you walk through a door, such as a raised step or threshold. Trim any bushes or trees on the path to your home. Use bright outdoor lighting. Clear any walking paths of anything that might make someone trip, such as rocks or tools. Regularly check to see if handrails are loose or broken. Make sure that both sides of any steps have handrails. Any raised decks and porches should have guardrails on the edges. Have any leaves, snow, or ice cleared regularly. Use sand or salt on walking paths during winter. Clean up any spills in your garage right away. This includes oil or grease spills. What can I do in the bathroom? Use night lights. Install grab bars by the toilet and in the tub and shower. Do not use towel bars as grab bars. Use non-skid mats or decals in the tub or shower. If you need to sit  down in the shower, use a plastic, non-slip stool. Keep the floor dry. Clean up any water that spills on the floor as soon as it happens. Remove soap buildup in the tub or shower regularly. Attach bath mats securely with double-sided non-slip rug tape. Do not have throw rugs and other things on the floor that can make you trip. What can I do in the bedroom? Use night lights. Make sure that you have a light by your bed that is easy to reach. Do not use any sheets or blankets that are too big for your bed. They should not hang down onto the floor. Have a firm chair that has side arms. You can use this for support while you get dressed. Do not have throw rugs and other things on the floor that can make you trip. What can I do in the kitchen? Clean up any spills right away. Avoid walking on wet floors. Keep items that you use a lot in easy-to-reach places. If you need to reach something above you, use a strong step stool that has a grab bar. Keep electrical cords out of the way. Do not use floor polish or wax that makes floors slippery. If you must  use wax, use non-skid floor wax. Do not have throw rugs and other things on the floor that can make you trip. What can I do with my stairs? Do not leave any items on the stairs. Make sure that there are handrails on both sides of the stairs and use them. Fix handrails that are broken or loose. Make sure that handrails are as long as the stairways. Check any carpeting to make sure that it is firmly attached to the stairs. Fix any carpet that is loose or worn. Avoid having throw rugs at the top or bottom of the stairs. If you do have throw rugs, attach them to the floor with carpet tape. Make sure that you have a light switch at the top of the stairs and the bottom of the stairs. If you do not have them, ask someone to add them for you. What else can I do to help prevent falls? Wear shoes that: Do not have high heels. Have rubber bottoms. Are  comfortable and fit you well. Are closed at the toe. Do not wear sandals. If you use a stepladder: Make sure that it is fully opened. Do not climb a closed stepladder. Make sure that both sides of the stepladder are locked into place. Ask someone to hold it for you, if possible. Clearly mark and make sure that you can see: Any grab bars or handrails. First and last steps. Where the edge of each step is. Use tools that help you move around (mobility aids) if they are needed. These include: Canes. Walkers. Scooters. Crutches. Turn on the lights when you go into a dark area. Replace any light bulbs as soon as they burn out. Set up your furniture so you have a clear path. Avoid moving your furniture around. If any of your floors are uneven, fix them. If there are any pets around you, be aware of where they are. Review your medicines with your doctor. Some medicines can make you feel dizzy. This can increase your chance of falling. Ask your doctor what other things that you can do to help prevent falls. This information is not intended to replace advice given to you by your health care provider. Make sure you discuss any questions you have with your health care provider. Document Released: 10/10/2009 Document Revised: 05/21/2016 Document Reviewed: 01/18/2015 Elsevier Interactive Patient Education  2017 [employer].

## 2023-06-02 NOTE — Progress Notes (Signed)
Subjective:   Rachel Hensley is a 67 y.o. female who presents for Medicare Annual (Subsequent) preventive examination.  This wellness visit is conducted by a nurse.  The patient's medications were reviewed and reconciled since the patient's last visit.  History details were provided by the patient.  The history appears to be reliable.    Medical History: Patient history and Family history was reviewed  Medications, Allergies, and preventative health maintenance was reviewed and updated.  Cardiac Risk Factors include: advanced age (>56men, >75 women);diabetes mellitus     Objective:    Today's Vitals   06/02/23 1456 06/02/23 1553  BP:  118/62  Pulse:  77  Resp:  16  SpO2:  95%  Weight:  158 lb (71.7 kg)  Height:  5\' 3"  (1.6 m)  PainSc: 0-No pain 0-No pain   Body mass index is 27.99 kg/m.     05/29/2022    1:16 PM  Advanced Directives  Does Patient Have a Medical Advance Directive? No  Would patient like information on creating a medical advance directive? No - Patient declined    Current Medications (verified) Outpatient Encounter Medications as of 06/02/2023  Medication Sig   apixaban (ELIQUIS) 5 MG TABS tablet Take 1 tablet (5 mg total) by mouth 2 (two) times daily.   APPLE CIDER VINEGAR PO Take 1 tablet by mouth in the morning and at bedtime.   atorvastatin (LIPITOR) 20 MG tablet Take 1 tablet (20 mg total) by mouth daily.   cetirizine (ZYRTEC) 10 MG tablet Take 10 mg by mouth daily.   dapagliflozin propanediol (FARXIGA) 5 MG TABS tablet Take 1 tablet (5 mg total) by mouth daily before breakfast.   diltiazem (CARDIZEM) 30 MG tablet Take 30 mg by mouth every 6 (six) hours as needed.   EPINEPHrine 0.3 mg/0.3 mL IJ SOAJ injection Inject 0.3 mg into the muscle once.   Finerenone (KERENDIA) 10 MG TABS Take 1 tablet by mouth daily.   flecainide (TAMBOCOR) 50 MG tablet Take 1 tablet (50 mg total) by mouth 2 (two) times daily.   furosemide (LASIX) 20 MG tablet Take 1 tablet  (20 mg total) by mouth daily.   glimepiride (AMARYL) 4 MG tablet Take 1 tablet (4 mg total) by mouth 2 (two) times daily.   icosapent Ethyl (VASCEPA) 1 g capsule Take 2 capsules (2 g total) by mouth 2 (two) times daily.   Iron, Ferrous Sulfate, 325 (65 Fe) MG TABS Take 325 mg by mouth daily.   magnesium oxide (MAG-OX) 400 (240 Mg) MG tablet Take 800 mg by mouth 2 (two) times daily.   metFORMIN (GLUCOPHAGE) 1000 MG tablet Take 1 tablet (1,000 mg total) by mouth 2 (two) times daily with a meal.   metoprolol succinate (TOPROL-XL) 50 MG 24 hr tablet Take 1 tablet (50 mg total) by mouth daily. Take with or immediately following a meal.   omeprazole (PRILOSEC) 20 MG capsule Take 1 capsule (20 mg total) by mouth 2 (two) times daily.   ondansetron (ZOFRAN ODT) 4 MG disintegrating tablet Take 1 tablet (4 mg total) by mouth every 8 (eight) hours as needed for nausea or vomiting.   promethazine (PHENERGAN) 25 MG tablet Take 1 tablet (25 mg total) by mouth every 8 (eight) hours as needed for nausea or vomiting.   Semaglutide, 1 MG/DOSE, 4 MG/3ML SOPN Inject 1 mg as directed once a week.   No facility-administered encounter medications on file as of 06/02/2023.    Allergies (verified) Actos [pioglitazone], Fenofibrate,  Lisinopril, and Prednisone   History: Past Medical History:  Diagnosis Date   Angioedema 04/05/2018   Hypoglycemia due to type 2 diabetes mellitus (HCC) 08/21/2020   Mixed hyperlipidemia    Nontoxic multinodular goiter    Other vitamin B12 deficiency anemias    Primary insomnia 05/20/2020   Solitary pulmonary nodule    Type 2 diabetes mellitus with other specified complication [facility])    Past Surgical History:  Procedure Laterality Date   ABDOMINAL HYSTERECTOMY  06/1993   partial; still has both ovaries   HYSTEROTOMY     partial   Family History  Problem Relation Age of Onset   Coronary artery disease Mother    Glaucoma Mother    Colon cancer Father    Diabetes Sister    Atrial  fibrillation Sister    Cancer Brother        renal   Kidney cancer Brother    Diabetes type II Brother    Coronary artery disease Brother    Diabetes Brother    Diabetes Brother    Coronary artery disease Brother    Asthma Son        childhood asthma   Arrhythmia Son    Atrial fibrillation Son    Parkinsonism Son    Eczema Grandchild    Atopy Grandchild    Allergic rhinitis Neg Hx    Angioedema Neg Hx    Immunodeficiency Neg Hx    Urticaria Neg Hx    Breast cancer Neg Hx    Social History   Socioeconomic History   Marital status: Widowed    Spouse name: Not on file   Number of children: 3   Years of education: Not on file   Highest education level: Not on file  Occupational History   Occupation: Sewer  Tobacco Use   Smoking status: Never   Smokeless tobacco: Never  Vaping Use   Vaping Use: Never used  Substance and Sexual Activity   Alcohol use: Never   Drug use: Never   Sexual activity: Not on file  Other Topics Concern   Not on file  Social History Narrative   Not on file   Social Determinants of Health   Financial Resource Strain: Medium Risk (06/02/2023)   Overall Financial Resource Strain (CARDIA)    Difficulty of Paying Living Expenses: Somewhat hard  Food Insecurity: No Food Insecurity (06/02/2023)   Hunger Vital Sign    Worried About Running Out of Food in the Last Year: Never true    Ran Out of Food in the Last Year: Never true  Transportation Needs: No Transportation Needs (06/02/2023)   PRAPARE - Administrator, Civil Service (Medical): No    Lack of Transportation (Non-Medical): No  Physical Activity: Sufficiently Active (06/02/2023)   Exercise Vital Sign    Days of Exercise per Week: 5 days    Minutes of Exercise per Session: 30 min  Stress: No Stress Concern Present (06/02/2023)   [employer] of Occupational Health - Occupational Stress Questionnaire    Feeling of Stress : Only a little  Social Connections: Moderately Isolated  (12/16/2022)   Social Connection and Isolation Panel [NHANES]    Frequency of Communication with Friends and Family: More than three times a week    Frequency of Social Gatherings with Friends and Family: More than three times a week    Attends Religious Services: More than 4 times per year    Active Member of Clubs or Organizations: No  Attends Banker Meetings: Never    Marital Status: Widowed    Tobacco Counseling Counseling given: Not Answered   Clinical Intake:  Pre-visit preparation completed: Yes Pain : No/denies pain Pain Score: 0-No pain   BMI - recorded: 27.99 Nutritional Status: BMI 25 -29 Overweight Nutritional Risks: None Diabetes: Yes (Most recent A1C 7.6) CBG done?: No How often do you need to have someone help you when you read instructions, pamphlets, or other written materials from your doctor or pharmacy?: 1 - Never Interpreter Needed?: No    Activities of Daily Living    06/02/2023    3:59 PM 04/09/2023    8:20 AM  In your present state of health, do you have any difficulty performing the following activities:  Hearing? 0 0  Vision? 0 0  Difficulty concentrating or making decisions? 0 0  Walking or climbing stairs? 0 0  Dressing or bathing? 0 0  Doing errands, shopping? 0 0  Preparing Food and eating ? N   Using the Toilet? N   In the past six months, have you accidently leaked urine? Y   Do you have problems with loss of bowel control? N   Managing your Medications? N   Managing your Finances? N   Housekeeping or managing your Housekeeping? N     Patient Care Team: Blane Ohara, MD as PCP - General (Family Medicine) Marlowe Sax, RN as Case Manager (General Practice) Georgeanna Lea, MD as Consulting Physician (Cardiology)     Assessment:   This is a routine wellness examination for Aba.  Hearing/Vision screen No results found.  Dietary issues and exercise activities discussed: Current Exercise Habits: Home  exercise routine, Type of exercise: walking, Time (Minutes): 30, Frequency (Times/Week): 5, Weekly Exercise (Minutes/Week): 150, Exercise limited by: None identified  Depression Screen    06/02/2023    3:56 PM 04/09/2023    8:20 AM 10/08/2022   10:43 AM 07/28/2022    8:10 AM 05/29/2022   11:04 AM 04/15/2022    7:51 AM 01/21/2022    7:38 AM  PHQ 2/9 Scores  PHQ - 2 Score 0 0 2 0 0 0 0  PHQ- 9 Score   4 0       Fall Risk    06/02/2023    3:59 PM 04/09/2023    8:19 AM 12/24/2022    9:05 AM 10/21/2022   11:09 AM 05/29/2022   11:04 AM  Fall Risk   Falls in the past year? 0 0 0 0 0  Number falls in past yr: 0 0 0 0 0  Injury with Fall? 0 0 0 0 1  Risk for fall due to : No Fall Risks No Fall Risks No Fall Risks  No Fall Risks  Follow up Falls evaluation completed;Education provided Falls evaluation completed  Falls evaluation completed;Education provided Falls evaluation completed;Education provided    FALL RISK PREVENTION PERTAINING TO THE HOME:  Any stairs in or around the home? No  If so, are there any without handrails? No  Home free of loose throw rugs in walkways, pet beds, electrical cords, etc? Yes  Adequate lighting in your home to reduce risk of falls? Yes   ASSISTIVE DEVICES UTILIZED TO PREVENT FALLS:  Life alert? No  Use of a cane, walker or w/c? No  Grab bars in the bathroom? No  Shower chair or bench in shower? No  Elevated toilet seat or a handicapped toilet? No   Gait steady and fast without  use of assistive device  Cognitive Function:        06/02/2023    3:01 PM 05/29/2022    1:17 PM  6CIT Screen  What Year? 0 points 0 points  What month? 0 points 0 points  What time? 0 points 0 points  Count back from 20 0 points 0 points  Months in reverse 0 points 0 points  Repeat phrase 0 points 2 points  Total Score 0 points 2 points    Immunizations Immunization History  Administered Date(s) Administered   Fluad Quad(high Dose 65+) 11/26/2020, 10/15/2021, 10/05/2022    Influenza-Unspecified 09/28/2019   PNEUMOCOCCAL CONJUGATE-20 10/15/2021   Pneumococcal Polysaccharide-23 02/19/2020    TDAP status: Due, Education has been provided regarding the importance of this vaccine. Advised may receive this vaccine at local pharmacy or Health Dept. Aware to provide a copy of the vaccination record if obtained from local pharmacy or Health Dept. Verbalized acceptance and understanding.  Flu Vaccine status: Up to date  Pneumococcal vaccine status: Up to date  Covid-19 vaccine status: Declined, Education has been provided regarding the importance of this vaccine but patient still declined. Advised may receive this vaccine at local pharmacy or Health Dept.or vaccine clinic. Aware to provide a copy of the vaccination record if obtained from local pharmacy or Health Dept. Verbalized acceptance and understanding.  Qualifies for Shingles Vaccine? Yes   Zostavax completed No   Shingrix Completed?: No.    Education has been provided regarding the importance of this vaccine. Patient has been advised to call insurance company to determine out of pocket expense if they have not yet received this vaccine. Advised may also receive vaccine at local pharmacy or Health Dept. Verbalized acceptance and understanding.  Screening Tests Health Maintenance  Topic Date Due   Hepatitis C Screening  Never done   DTaP/Tdap/Td (1 - Tdap) Never done   Medicare Annual Wellness (AWV)  05/30/2023   COVID-19 Vaccine (1) 07/29/2023 (Originally 02/13/1957)   Zoster Vaccines- Shingrix (1 of 2) 06/01/2024 (Originally 08/13/2004)   INFLUENZA VACCINE  07/29/2023   HEMOGLOBIN A1C  10/09/2023   Diabetic kidney evaluation - eGFR measurement  04/08/2024   Diabetic kidney evaluation - Urine ACR  04/08/2024   FOOT EXAM  04/08/2024   MAMMOGRAM  05/04/2024   OPHTHALMOLOGY EXAM  05/10/2024   Colonoscopy  03/31/2026   Pneumonia Vaccine 102+ Years old  Completed   DEXA SCAN  Completed   HPV VACCINES  Aged  Out    Health Maintenance  Health Maintenance Due  Topic Date Due   Hepatitis C Screening  Never done   DTaP/Tdap/Td (1 - Tdap) Never done   Medicare Annual Wellness (AWV)  05/30/2023    Colorectal cancer screening: Type of screening: Colonoscopy. Completed 03/2021. Repeat every 5 years  Mammogram status: Completed 04/2023. Repeat every year  Bone Density status: Completed 10/2020. Results reflect: Bone density results: OSTEOPENIA. Repeat every 2 years.  Lung Cancer Screening: (Low Dose CT Chest recommended if Age 72-80 years, 30 pack-year currently smoking OR have quit w/in 15years.) does not qualify.   Lung Cancer Screening Referral: N/A  Additional Screening:  Vision Screening: Recommended annual ophthalmology exams for early detection of glaucoma and other disorders of the eye. Is the patient up to date with their annual eye exam?  No  Who is the provider or what is the name of the office in which the patient attends annual eye exams? [facility]  Dental Screening: Recommended annual dental exams for proper  oral hygiene  Community Resource Referral / Chronic Care Management: CRR required this visit?  No   CCM required this visit?  No      Plan:    1- Counseling was provided today regarding the following topics: healthy eating habits, home safety, vitamin and mineral supplementation (calcium and Vit D), regular exercise, breast self-exam, tobacco avoidance, limitation of alcohol intake, use of seat belts, firearm safety, and fall prevention.  Annual recommendations include: influenza vaccine, dental cleanings, and eye exams.   I have personally reviewed and noted the following in the patient's chart:   Medical and social history Use of alcohol, tobacco or illicit drugs  Current medications and supplements including opioid prescriptions. Patient is not currently taking opioid prescriptions. Functional ability and status Nutritional status Physical activity Advanced  directives List of other physicians Hospitalizations, surgeries, and ER visits in previous 12 months Vitals Screenings to include cognitive, depression, and falls Referrals and appointments  In addition, I have reviewed and discussed with patient certain preventive protocols, quality metrics, and best practice recommendations. A written personalized care plan for preventive services as well as general preventive health recommendations were provided to patient.     Jacklynn Bue, LPN   [birth year]1913

## 2023-06-02 NOTE — Telephone Encounter (Signed)
Called patient and made her aware to call office once she receive and starts Synjardy, so we can updated her medication list.

## 2023-06-03 ENCOUNTER — Telehealth: Payer: Self-pay | Admitting: Pharmacy Technician

## 2023-06-03 DIAGNOSIS — Z5986 Financial insecurity: Secondary | ICD-10-CM

## 2023-06-03 NOTE — Progress Notes (Signed)
[facility] Customer service manager [facility])                                            [facility] Quality Pharmacy Team    06/03/2023  Rachel Hensley May 24, 1956 [id number]                                      Medication Assistance Referral  Referral From: [facility] RPh Tiffany B.  Medication/Company: Eliquis / BMS Patient application portion:  Mailed Provider application portion: Faxed  to Dr. Blane Ohara Provider address/fax verified via: Office website   [name] P. [name], CPhT [facility] [employer]  [phone]

## 2023-06-07 ENCOUNTER — Ambulatory Visit (INDEPENDENT_AMBULATORY_CARE_PROVIDER_SITE_OTHER): Payer: PPO

## 2023-06-07 ENCOUNTER — Telehealth: Payer: PPO

## 2023-06-07 DIAGNOSIS — I10 Essential (primary) hypertension: Secondary | ICD-10-CM

## 2023-06-07 DIAGNOSIS — E1159 Type 2 diabetes mellitus with other circulatory complications: Secondary | ICD-10-CM

## 2023-06-07 DIAGNOSIS — I4891 Unspecified atrial fibrillation: Secondary | ICD-10-CM

## 2023-06-07 DIAGNOSIS — F332 Major depressive disorder, recurrent severe without psychotic features: Secondary | ICD-10-CM

## 2023-06-07 DIAGNOSIS — I480 Paroxysmal atrial fibrillation: Secondary | ICD-10-CM

## 2023-06-07 DIAGNOSIS — E1121 Type 2 diabetes mellitus with diabetic nephropathy: Secondary | ICD-10-CM

## 2023-06-07 DIAGNOSIS — F4321 Adjustment disorder with depressed mood: Secondary | ICD-10-CM

## 2023-06-07 NOTE — Patient Instructions (Signed)
Please call the care guide team at [phone] if you need to cancel or reschedule your appointment.   If you are experiencing a Mental Health or Behavioral Health Crisis or need someone to talk to, please call the Suicide and Crisis Lifeline: 988 call the [place] National Suicide Prevention Lifeline: (365)771-2560 or TTY: 206-500-6254 TTY 774-537-6338) to talk to a trained counselor call 1-800-273-TALK (toll free, 24 hour hotline) go to [facility] [address], [place] [phone])   Following is a copy of the CCM Program Consent:  CCM service includes personalized support from designated clinical staff supervised by the physician, including individualized plan of care and coordination with other care providers 24/7 contact phone numbers for assistance for urgent and routine care needs. Service will only be billed when office clinical staff spend 20 minutes or more in a month to coordinate care. Only one practitioner may furnish and bill the service in a calendar month. The patient may stop CCM services at amy time (effective at the end of the month) by phone call to the office staff. The patient will be responsible for cost sharing (co-pay) or up to 20% of the service fee (after annual deductible is met)  Following is a copy of your full provider care plan:   Goals Addressed             This Visit's Progress    CCM (depression and grief) EXPECTED OUTCOME: MONITOR, SELF-MANAGE AND REDUCE SYMPTOMS OF Depression and Grief       Current Barriers:  Care Coordination needs related to GriefShare support and resources in a patient with depression and grief Chronic Disease Management support and education needs related to effective management of depression and grief  Planned Interventions: Evaluation of current treatment plan related to depression and grief and patient's adherence to plan as established by provider. The patient feels she is doing okay  right now with her depression and grief.  Doing well with her depression and grief. The patient is stable and denies any new issues related to her grief and depression. Has good support system. The patient is doing well. Denies any acute changes in her depression or grief. She is leaving [place] to go to [place] with 2 of her sons and their families. Advised patient to call the office for changes in mood, anxiety, depression, or acute changes in mental health Provided education to patient re: about the GriefShare and the availability of grief support due to the loss of her husband, mom, brother, and other family members over the last few years. Review of the GriefShare program and the resources available Reviewed scheduled/upcoming provider appointments including 07-29-2023 Discussed plans with patient for ongoing care management follow up and provided patient with direct contact information for care management team Advised patient to discuss changes in her mental health, depression and grief with provider. Reflective listening and support given Screening for signs and symptoms of depression related to chronic disease state  Assessed social determinant of health barriers The patient denies any new issues related to UTI, or changes in her urinary function. Knows to call the office for changes.   Symptom Management: Attend all scheduled provider appointments Attend church or other social activities Work with the social worker to address care coordination needs and will continue to work with the clinical team to address health care and disease management related needs call the Suicide and Crisis Lifeline: 988 call the [place] National Suicide Prevention Lifeline: (731) 133-9846 or TTY: 479-029-9928 TTY (  (559) 467-6338) to talk to a trained counselor call 1-800-273-TALK (toll free, 24 hour hotline) if experiencing a Mental Health or Behavioral Health Crisis   Follow Up Plan: Telephone follow up  appointment with care management team member scheduled for: 08-09-2023 at 0900 am       CCM Expected Outcome:  Monitor, Self-Manage and Reduce Symptoms of Afib       Current Barriers:  Knowledge Deficits related to AFIB and recent hospitalization for new onset of AFIB Care Coordination needs related to resources and management of AFIB in a patient with new onset of AFIB with hospitalization for AFIB with [facility] Chronic Disease Management support and education needs related to effective management of AFIB  Planned Interventions: Provider order and care plan reviewed. The patient feels that the current plan for effective management of her Afib is working well for her. She states that her AFIB is stable. Saw cardiologist in February and will see again in 6 months, around August. Overall no new concerns with her AFIB or heart health. Counseled on increased risk of stroke due to Afib and benefits of anticoagulation for stroke prevention. The patient is aware of the risk of Stroke due to AFIB, knows what Sx and Sx to look for           Reviewed importance of adherence to anticoagulant exactly as prescribed. Is taking medications as directed and is also taking Magnesium 1600 mg daily. The cardiologist does not like this high dose but is trying to keep her levels up of magnesium. Is being followed closely. The patient is working with the pharm D on help for Eliquis cost due to being in the donut hole. Education and support given.  Advised patient to discuss changes in her heart rate, changes in her sx and sx, questions and concerns with provider Counseled on bleeding risk associated with AFIB and importance of self-monitoring for signs/symptoms of bleeding Counseled on avoidance of NSAIDs due to increased bleeding risk with anticoagulants Counseled on importance of regular laboratory monitoring as prescribed. Had repeat labs last week for check on Magnesium levels Counseled on seeking medical attention after a  head injury or if there is blood in the urine/stool Afib action plan reviewed. The patient is taking Eliquis 5 mg QD. She also has Cardizem to take prn. She has parameters to take the Cardizem. She is also taking Magnesium that is helping her as her Magnesium was low.  Screening for signs and symptoms of depression related to chronic disease state Assessed social determinant of health barriers  Symptom Management: Take medications as prescribed   Attend all scheduled provider appointments Call provider office for new concerns or questions  call the Suicide and Crisis Lifeline: 988 call the [place] National Suicide Prevention Lifeline: 779-613-5348 or TTY: (903)092-2211 TTY 6313932663) to talk to a trained counselor call 1-800-273-TALK (toll free, 24 hour hotline) if experiencing a Mental Health or Behavioral Health Crisis  - check pulse (heart) rate before taking medicine - make a plan to eat healthy - keep all lab appointments - take medicine as prescribed  Follow Up Plan: Telephone follow up appointment with care management team member scheduled for: 08-09-2023 at 0900 am       CCM Expected Outcome:  Monitor, Self-Manage and Reduce Symptoms of Diabetes       Current Barriers:  Care Coordination needs related to loss of job and inability to pay for medications in a patient with DM Chronic Disease Management support and education needs related to effective  management of DM Financial Constraints- working with pharm D for PAP assistance Difficulty obtaining medications  Lab Results  Component Value Date   HGBA1C 7.6 (H) 04/09/2023  Las in December 2023 6.9   Planned Interventions: Provided education to patient about basic DM disease process. Patients A1C is elevated with most recent visit. The patient saw pcp earlier in the month and had changes in medications. Is compliant with the plan of care. Education and support given; Reviewed medications with patient and discussed importance  of medication adherence. The patient was approved for Ozempic and is on 0.5mg . She is thankful that she gets financial assistance for Ozempic through 2024. The pcp has increased the dose of her Ozempic to 1 mg. She has also been approved for [facility] and is taking this now. Is seeing better readigns.  Reviewed prescribed diet with patient heart healthy/ADA diet. Review and education. The patient is eating a snack before bed. She states that she is not hungry and sometimes she forgets to eat until it is late.  Counseled on importance of regular laboratory monitoring as prescribed. Has regular lab work ;        Discussed plans with patient for ongoing care management follow up and provided patient with direct contact information for care management team;      Provided patient with written educational materials related to hypo and hyperglycemia and importance of correct treatment. The highest she has seen lately is 150 recently, and the lowest was 88.    Reviewed scheduled/upcoming provider appointments including: 07-29-2023  Advised patient, providing education and rationale, to check cbg when you have symptoms of low or high blood sugar and as directed   and record. The patient checks her blood sugars. The patient states her readings have been around 130 to 150 with the lowest she has seen is 88. She is hoping they will start stabilizing out. Review of goal of fasting <130 and post prandial of <180     call provider for findings outside established parameters;       Referral made to pharmacy team for assistance with financial difficulties of being able to afford medications for effective management of DM. Has been approved for Ozempic through 2024 and is thankful for this. Works with pharm D on a regular basis;       Referral made to social work team for assistance with financial barriers, grief and depression. Has been working with the LCSW;      Review of patient status, including review of consultants  reports, relevant laboratory and other test results, and medications completed;       Advised patient to discuss changes in her DM health and well being with provider;      Screening for signs and symptoms of depression related to chronic disease state;        Assessed social determinant of health barriers;       Patient to schedule an appointment with the eye doctor after the first of the year.  Symptom Management: Take medications as prescribed   Attend all scheduled provider appointments Call pharmacy for medication refills 3-7 days in advance of running out of medications Call provider office for new concerns or questions  Work with the social worker to address care coordination needs and will continue to work with the clinical team to address health care and disease management related needs call the Suicide and Crisis Lifeline: 988 call the [place] National Suicide Prevention Lifeline: 415-339-4859 or TTY: 416-174-3128 TTY (515) 841-7813) to  talk to a trained counselor call 1-800-273-TALK (toll free, 24 hour hotline) if experiencing a Mental Health or Behavioral Health Crisis  schedule appointment with eye doctor check feet daily for cuts, sores or redness trim toenails straight across fill half of plate with vegetables manage portion size prepare main meal at home 3 to 5 days each week wash and dry feet carefully every day wear comfortable, cotton socks wear comfortable, well-fitting shoes  Follow Up Plan: Telephone follow up appointment with care management team member scheduled for: 08-09-2023 at 0900 am       CCM Expected Outcome:  Monitor, Self-Manage, and Reduce Symptoms of Hypertension       Current Barriers:  Chronic Disease Management support and education needs related to effective management of DM Financial Constraints.  BP Readings from Last 3 Encounters:  06/02/23 118/62  04/09/23 118/72  02/19/23 128/68     Planned Interventions: Evaluation of current treatment  plan related to hypertension self management and patient's adherence to plan as established by provider. The patient is compliant with the plan of care. Denies any new concerns with HTN. Blood pressures are stable. Is stable right now with her Afib. Saw the cardiologist in January and February. Had adjustment in medications. Feels like she is doing well. Will not see the cardiologist again for 6 months, around August, unless there are issues;   Provided education to patient re: stroke prevention, s/s of heart attack and stroke; Reviewed prescribed diet heart healthy/ADA diet. The patient is eating healthy. Sometimes eats late due to no appetite from the medications she is taking for her DM. Review of healthy snacks. The patient has been having issues with keeping her magnesium level up. Magnesium is causing her to have diarrhea but that is better since she is taking her iron every day. Education on watching for dietary changes and making sure she is following a heart healthy/ADA diet. Reviewed medications with patient and discussed importance of compliance. The patient is compliant with medications. Works with pharm D for medication assistance and support. Patient got approved for help with medication cost. Ongoing support and education from the pharm D.;  Discussed plans with patient for ongoing care management follow up and provided patient with direct contact information for care management team; Advised patient, providing education and rationale, to monitor blood pressure daily and record, calling PCP for findings outside established parameters;  Advised patient to discuss changes in blood pressure or other heart concerns  with provider; Provided education on prescribed diet heart healthy/ADA diet. Education on foods high in Magnesium: fish, greens, nuts, vegetables, beans ;  Discussed complications of poorly controlled blood pressure such as heart disease, stroke, circulatory complications, vision  complications, kidney impairment, sexual dysfunction;  Next pcp appointment on 07-29-2023. Sees cardiologist in August. Denies any new needs at this time.   Symptom Management: Take medications as prescribed   Attend all scheduled provider appointments Call pharmacy for medication refills 3-7 days in advance of running out of medications Call provider office for new concerns or questions  call the Suicide and Crisis Lifeline: 988 call the [place] National Suicide Prevention Lifeline: 406-811-7333 or TTY: 534-447-3847 TTY (548)070-4513) to talk to a trained counselor call 1-800-273-TALK (toll free, 24 hour hotline) if experiencing a Mental Health or Behavioral Health Crisis  check blood pressure weekly learn about high blood pressure call doctor for signs and symptoms of high blood pressure develop an action plan for high blood pressure keep all doctor appointments take medications for blood  pressure exactly as prescribed report new symptoms to your doctor  Follow Up Plan: Telephone follow up appointment with care management team member scheduled for: 08-09-2023 at 0900 am          Patient verbalizes understanding of instructions and care plan provided today and agrees to view in MyChart. Active MyChart status and patient understanding of how to access instructions and care plan via MyChart confirmed with patient.  Telephone follow up appointment with care management team member scheduled for: 08-09-2023 at 0900 am

## 2023-06-07 NOTE — Chronic Care Management (AMB) (Signed)
Chronic Care Management   CCM RN Visit Note  06/07/2023 Name: Rachel Hensley MRN: [id number] DOB: [birth year]  Subjective: Rachel Hensley is a 67 y.o. year old female who is a primary care patient of Cox, Kirsten, MD. The patient was referred to the Chronic Care Management team for assistance with care management needs subsequent to provider initiation of CCM services and plan of care.    Today's Visit:  Engaged with patient by telephone for follow up visit.        Goals Addressed             This Visit's Progress    CCM (depression and grief) EXPECTED OUTCOME: MONITOR, SELF-MANAGE AND REDUCE SYMPTOMS OF Depression and Grief       Current Barriers:  Care Coordination needs related to GriefShare support and resources in a patient with depression and grief Chronic Disease Management support and education needs related to effective management of depression and grief  Planned Interventions: Evaluation of current treatment plan related to depression and grief and patient's adherence to plan as established by provider. The patient feels she is doing okay right now with her depression and grief.  Doing well with her depression and grief. The patient is stable and denies any new issues related to her grief and depression. Has good support system. The patient is doing well. Denies any acute changes in her depression or grief. She is leaving [place] to go to [place] with 2 of her sons and their families. Advised patient to call the office for changes in mood, anxiety, depression, or acute changes in mental health Provided education to patient re: about the GriefShare and the availability of grief support due to the loss of her husband, mom, brother, and other family members over the last few years. Review of the GriefShare program and the resources available Reviewed scheduled/upcoming provider appointments including 07-29-2023 Discussed plans with patient for ongoing care management  follow up and provided patient with direct contact information for care management team Advised patient to discuss changes in her mental health, depression and grief with provider. Reflective listening and support given Screening for signs and symptoms of depression related to chronic disease state  Assessed social determinant of health barriers The patient denies any new issues related to UTI, or changes in her urinary function. Knows to call the office for changes.   Symptom Management: Attend all scheduled provider appointments Attend church or other social activities Work with the social worker to address care coordination needs and will continue to work with the clinical team to address health care and disease management related needs call the Suicide and Crisis Lifeline: 988 call the [place] National Suicide Prevention Lifeline: 567-797-0216 or TTY: 351-484-5445 TTY 828-243-0972) to talk to a trained counselor call 1-800-273-TALK (toll free, 24 hour hotline) if experiencing a Mental Health or Behavioral Health Crisis   Follow Up Plan: Telephone follow up appointment with care management team member scheduled for: 08-09-2023 at 0900 am       CCM Expected Outcome:  Monitor, Self-Manage and Reduce Symptoms of Afib       Current Barriers:  Knowledge Deficits related to AFIB and recent hospitalization for new onset of AFIB Care Coordination needs related to resources and management of AFIB in a patient with new onset of AFIB with hospitalization for AFIB with [facility] Chronic Disease Management support and education needs related to effective management of AFIB  Planned Interventions: Provider order and care plan reviewed. The patient feels that  the current plan for effective management of her Afib is working well for her. She states that her AFIB is stable. Saw cardiologist in February and will see again in 6 months, around August. Overall no new concerns with her AFIB or heart health. Counseled  on increased risk of stroke due to Afib and benefits of anticoagulation for stroke prevention. The patient is aware of the risk of Stroke due to AFIB, knows what Sx and Sx to look for           Reviewed importance of adherence to anticoagulant exactly as prescribed. Is taking medications as directed and is also taking Magnesium 1600 mg daily. The cardiologist does not like this high dose but is trying to keep her levels up of magnesium. Is being followed closely. The patient is working with the pharm D on help for Eliquis cost due to being in the donut hole. Education and support given.  Advised patient to discuss changes in her heart rate, changes in her sx and sx, questions and concerns with provider Counseled on bleeding risk associated with AFIB and importance of self-monitoring for signs/symptoms of bleeding Counseled on avoidance of NSAIDs due to increased bleeding risk with anticoagulants Counseled on importance of regular laboratory monitoring as prescribed. Had repeat labs last week for check on Magnesium levels Counseled on seeking medical attention after a head injury or if there is blood in the urine/stool Afib action plan reviewed. The patient is taking Eliquis 5 mg QD. She also has Cardizem to take prn. She has parameters to take the Cardizem. She is also taking Magnesium that is helping her as her Magnesium was low.  Screening for signs and symptoms of depression related to chronic disease state Assessed social determinant of health barriers  Symptom Management: Take medications as prescribed   Attend all scheduled provider appointments Call provider office for new concerns or questions  call the Suicide and Crisis Lifeline: 988 call the [place] National Suicide Prevention Lifeline: 308-083-1616 or TTY: 8284234515 TTY (903)255-1939) to talk to a trained counselor call 1-800-273-TALK (toll free, 24 hour hotline) if experiencing a Mental Health or Behavioral Health Crisis  - check  pulse (heart) rate before taking medicine - make a plan to eat healthy - keep all lab appointments - take medicine as prescribed  Follow Up Plan: Telephone follow up appointment with care management team member scheduled for: 08-09-2023 at 0900 am       CCM Expected Outcome:  Monitor, Self-Manage and Reduce Symptoms of Diabetes       Current Barriers:  Care Coordination needs related to loss of job and inability to pay for medications in a patient with DM Chronic Disease Management support and education needs related to effective management of DM Financial Constraints- working with pharm D for PAP assistance Difficulty obtaining medications  Lab Results  Component Value Date   HGBA1C 7.6 (H) 04/09/2023  [place] in December 2023 6.9   Planned Interventions: Provided education to patient about basic DM disease process. Patients A1C is elevated with most recent visit. The patient saw pcp earlier in the month and had changes in medications. Is compliant with the plan of care. Education and support given; Reviewed medications with patient and discussed importance of medication adherence. The patient was approved for Ozempic and is on 0.5mg . She is thankful that she gets financial assistance for Ozempic through 2024. The pcp has increased the dose of her Ozempic to 1 mg. She has also been approved for [facility] and is  taking this now. Is seeing better readigns.  Reviewed prescribed diet with patient heart healthy/ADA diet. Review and education. The patient is eating a snack before bed. She states that she is not hungry and sometimes she forgets to eat until it is late.  Counseled on importance of regular laboratory monitoring as prescribed. Has regular lab work ;        Discussed plans with patient for ongoing care management follow up and provided patient with direct contact information for care management team;      Provided patient with written educational materials related to hypo and hyperglycemia  and importance of correct treatment. The highest she has seen lately is 150 recently, and the lowest was 88.    Reviewed scheduled/upcoming provider appointments including: 07-29-2023  Advised patient, providing education and rationale, to check cbg when you have symptoms of low or high blood sugar and as directed   and record. The patient checks her blood sugars. The patient states her readings have been around 130 to 150 with the lowest she has seen is 88. She is hoping they will start stabilizing out. Review of goal of fasting <130 and post prandial of <180     call provider for findings outside established parameters;       Referral made to pharmacy team for assistance with financial difficulties of being able to afford medications for effective management of DM. Has been approved for Ozempic through 2024 and is thankful for this. Works with pharm D on a regular basis;       Referral made to social work team for assistance with financial barriers, grief and depression. Has been working with the LCSW;      Review of patient status, including review of consultants reports, relevant laboratory and other test results, and medications completed;       Advised patient to discuss changes in her DM health and well being with provider;      Screening for signs and symptoms of depression related to chronic disease state;        Assessed social determinant of health barriers;       Patient to schedule an appointment with the eye doctor after the first of the year.  Symptom Management: Take medications as prescribed   Attend all scheduled provider appointments Call pharmacy for medication refills 3-7 days in advance of running out of medications Call provider office for new concerns or questions  Work with the social worker to address care coordination needs and will continue to work with the clinical team to address health care and disease management related needs call the Suicide and Crisis Lifeline:  988 call the [place] National Suicide Prevention Lifeline: (435) 407-4148 or TTY: (216)438-8871 TTY 564-139-3340) to talk to a trained counselor call 1-800-273-TALK (toll free, 24 hour hotline) if experiencing a Mental Health or Behavioral Health Crisis  schedule appointment with eye doctor check feet daily for cuts, sores or redness trim toenails straight across fill half of plate with vegetables manage portion size prepare main meal at home 3 to 5 days each week wash and dry feet carefully every day wear comfortable, cotton socks wear comfortable, well-fitting shoes  Follow Up Plan: Telephone follow up appointment with care management team member scheduled for: 08-09-2023 at 0900 am       CCM Expected Outcome:  Monitor, Self-Manage, and Reduce Symptoms of Hypertension       Current Barriers:  Chronic Disease Management support and education needs related to effective management of DM  Corporate treasurer.  BP Readings from Last 3 Encounters:  06/02/23 118/62  04/09/23 118/72  02/19/23 128/68     Planned Interventions: Evaluation of current treatment plan related to hypertension self management and patient's adherence to plan as established by provider. The patient is compliant with the plan of care. Denies any new concerns with HTN. Blood pressures are stable. Is stable right now with her Afib. Saw the cardiologist in January and February. Had adjustment in medications. Feels like she is doing well. Will not see the cardiologist again for 6 months, around August, unless there are issues;   Provided education to patient re: stroke prevention, s/s of heart attack and stroke; Reviewed prescribed diet heart healthy/ADA diet. The patient is eating healthy. Sometimes eats late due to no appetite from the medications she is taking for her DM. Review of healthy snacks. The patient has been having issues with keeping her magnesium level up. Magnesium is causing her to have diarrhea but that is  better since she is taking her iron every day. Education on watching for dietary changes and making sure she is following a heart healthy/ADA diet. Reviewed medications with patient and discussed importance of compliance. The patient is compliant with medications. Works with pharm D for medication assistance and support. Patient got approved for help with medication cost. Ongoing support and education from the pharm D.;  Discussed plans with patient for ongoing care management follow up and provided patient with direct contact information for care management team; Advised patient, providing education and rationale, to monitor blood pressure daily and record, calling PCP for findings outside established parameters;  Advised patient to discuss changes in blood pressure or other heart concerns  with provider; Provided education on prescribed diet heart healthy/ADA diet. Education on foods high in Magnesium: fish, greens, nuts, vegetables, beans ;  Discussed complications of poorly controlled blood pressure such as heart disease, stroke, circulatory complications, vision complications, kidney impairment, sexual dysfunction;  Next pcp appointment on 07-29-2023. Sees cardiologist in August. Denies any new needs at this time.   Symptom Management: Take medications as prescribed   Attend all scheduled provider appointments Call pharmacy for medication refills 3-7 days in advance of running out of medications Call provider office for new concerns or questions  call the Suicide and Crisis Lifeline: 988 call the [place] National Suicide Prevention Lifeline: 726-879-6818 or TTY: 845 751 7655 TTY 212-469-0268) to talk to a trained counselor call 1-800-273-TALK (toll free, 24 hour hotline) if experiencing a Mental Health or Behavioral Health Crisis  check blood pressure weekly learn about high blood pressure call doctor for signs and symptoms of high blood pressure develop an action plan for high blood  pressure keep all doctor appointments take medications for blood pressure exactly as prescribed report new symptoms to your doctor  Follow Up Plan: Telephone follow up appointment with care management team member scheduled for: 08-09-2023 at 0900 am          Plan:Telephone follow up appointment with care management team member scheduled for:  08-09-2023 at 0900 am  Alto Denver RN, MSN, CCM RN Care Manager  Chronic Care Management Direct Number: [phone]

## 2023-06-09 ENCOUNTER — Telehealth: Payer: Self-pay | Admitting: Family Medicine

## 2023-06-09 NOTE — Telephone Encounter (Signed)
   Rachel Hensley has been scheduled for the following appointment:  WHAT: BONE DENSITY WHERE: [facility] DATE: 06/17/23 TIME: 3:00 PM CHECK-IN  Patient has been made aware.

## 2023-06-17 ENCOUNTER — Telehealth: Payer: Self-pay | Admitting: Pharmacy Technician

## 2023-06-17 DIAGNOSIS — Z5986 Financial insecurity: Secondary | ICD-10-CM

## 2023-06-17 NOTE — Progress Notes (Signed)
[facility])                                            [facility] Quality Pharmacy Team    06/17/2023  Rachel Hensley 1956/08/14 [id number]  Received both patient and provider portion(s) of patient assistance application(s) for Eliquis. Faxed completed application and required documents into [employer].    Pattricia Boss, CPhT [facility] Office: [phone] Fax: [phone] Email: [email or web address]

## 2023-06-22 ENCOUNTER — Telehealth: Payer: Self-pay

## 2023-06-22 ENCOUNTER — Other Ambulatory Visit: Payer: Self-pay

## 2023-06-22 NOTE — Telephone Encounter (Signed)
Received documentation stating that patient has been approved to receive Eliquis free-of-charge 06/17/2023 - 12/28/2023 through [employer] [facility].

## 2023-06-23 ENCOUNTER — Telehealth: Payer: Self-pay | Admitting: Pharmacy Technician

## 2023-06-23 DIAGNOSIS — Z5986 Financial insecurity: Secondary | ICD-10-CM

## 2023-06-23 NOTE — Progress Notes (Addendum)
[facility])                                            [facility] Quality Pharmacy Team    06/23/2023  Rachel Hensley Mar 24, 1956 [id number]  Care coordination call placed to [employer] in regard to Eliquis application.  Spoke to [place] who informs patient is APPROVED 06/17/23-12/28/23. Initial and subsequent refills will be processed automatically each time and delivered to the patient's home address. If patient should feel current supply is not sufficient until next automated shipment, then patient can call [facility] at [phone] to discuss the refill request. [facility]'s pharmacy is [facility] and their number is [phone].  Pattricia Boss, CPhT [facility] Office: [phone] Fax: [phone] Email: [email or web address]

## 2023-06-27 DIAGNOSIS — I4891 Unspecified atrial fibrillation: Secondary | ICD-10-CM

## 2023-06-27 DIAGNOSIS — I480 Paroxysmal atrial fibrillation: Secondary | ICD-10-CM

## 2023-06-27 DIAGNOSIS — E1159 Type 2 diabetes mellitus with other circulatory complications: Secondary | ICD-10-CM

## 2023-06-27 DIAGNOSIS — E1121 Type 2 diabetes mellitus with diabetic nephropathy: Secondary | ICD-10-CM

## 2023-06-27 DIAGNOSIS — I10 Essential (primary) hypertension: Secondary | ICD-10-CM

## 2023-06-27 DIAGNOSIS — F4321 Adjustment disorder with depressed mood: Secondary | ICD-10-CM

## 2023-06-27 DIAGNOSIS — I152 Hypertension secondary to endocrine disorders: Secondary | ICD-10-CM

## 2023-06-27 DIAGNOSIS — F332 Major depressive disorder, recurrent severe without psychotic features: Secondary | ICD-10-CM

## 2023-07-16 LAB — HM DEXA SCAN

## 2023-07-29 ENCOUNTER — Other Ambulatory Visit: Payer: Self-pay | Admitting: Family Medicine

## 2023-07-29 ENCOUNTER — Ambulatory Visit (INDEPENDENT_AMBULATORY_CARE_PROVIDER_SITE_OTHER): Payer: PPO | Admitting: Family Medicine

## 2023-07-29 ENCOUNTER — Encounter: Payer: Self-pay | Admitting: Family Medicine

## 2023-07-29 VITALS — BP 120/78 | HR 72 | Temp 97.2°F | Resp 14 | Ht 63.0 in | Wt 152.6 lb

## 2023-07-29 DIAGNOSIS — K219 Gastro-esophageal reflux disease without esophagitis: Secondary | ICD-10-CM | POA: Diagnosis not present

## 2023-07-29 DIAGNOSIS — I152 Hypertension secondary to endocrine disorders: Secondary | ICD-10-CM

## 2023-07-29 DIAGNOSIS — E1159 Type 2 diabetes mellitus with other circulatory complications: Secondary | ICD-10-CM | POA: Diagnosis not present

## 2023-07-29 DIAGNOSIS — R8290 Unspecified abnormal findings in urine: Secondary | ICD-10-CM

## 2023-07-29 DIAGNOSIS — I480 Paroxysmal atrial fibrillation: Secondary | ICD-10-CM

## 2023-07-29 DIAGNOSIS — G8929 Other chronic pain: Secondary | ICD-10-CM

## 2023-07-29 DIAGNOSIS — E1121 Type 2 diabetes mellitus with diabetic nephropathy: Secondary | ICD-10-CM

## 2023-07-29 DIAGNOSIS — E782 Mixed hyperlipidemia: Secondary | ICD-10-CM | POA: Diagnosis not present

## 2023-07-29 DIAGNOSIS — M25562 Pain in left knee: Secondary | ICD-10-CM

## 2023-07-29 DIAGNOSIS — Z1159 Encounter for screening for other viral diseases: Secondary | ICD-10-CM

## 2023-07-29 DIAGNOSIS — D509 Iron deficiency anemia, unspecified: Secondary | ICD-10-CM

## 2023-07-29 DIAGNOSIS — D518 Other vitamin B12 deficiency anemias: Secondary | ICD-10-CM | POA: Insufficient documentation

## 2023-07-29 HISTORY — DX: Other chronic pain: G89.29

## 2023-07-29 LAB — POCT URINALYSIS DIP (CLINITEK)
Blood, UA: NEGATIVE
Glucose, UA: 250 mg/dL — AB
Leukocytes, UA: NEGATIVE
Nitrite, UA: NEGATIVE
POC PROTEIN,UA: NEGATIVE
Spec Grav, UA: 1.02 (ref 1.010–1.025)
Urobilinogen, UA: 0.2 E.U./dL
pH, UA: 6 (ref 5.0–8.0)

## 2023-07-29 NOTE — Progress Notes (Signed)
Subjective:  Patient ID: Rachel Hensley, female    DOB: 1956-08-27  Age: 67 y.o. MRN: [id number]  Chief Complaint  Patient presents with   Medical Management of Chronic Issues    HPI Diabetes:  Complications: nephropathy.  Glucose checking: daily  Glucose logs: 70-100 Hypoglycemia: yes (had one episode of her bs in the 40's.)  Most recent A1C: 6.9% Current medications:  Glimepiride 4 mg BID, Ozempic 1 mg  weekly, Kerendia 10 mg daily, Synjardy 1000 mg twice daily.   Last Eye Exam: 03/18/2022. Has retinopathy screening scheduled in May here at our office. Foot checks: daily. Not exercising due to knee pain.  Hyperlipidemia: Current medications: Vascepa 1 gm 2 capsules twice a day and atorvastatin 20 mg once daily.    Hypertension: Current medications: metoprolol 50 mg daily, lasix 20 mg daily.   Atrial fibrillation: flecainide, metoprolol and eliquis.  GERD: omeprazole 20 mg twice daily. Well controlled.   Diet: fairly healthy Exercise: had to stop.  Patient had some left knee pain.  It felt like it had gone out a little bit when she was walking.  Patient was seen by Dr. Deberah Castle with [facility].  I had done some x-rays.  She has not been back in some time.     07/29/2023    8:38 AM 06/02/2023    3:56 PM 04/09/2023    8:20 AM 10/08/2022   10:43 AM 07/28/2022    8:10 AM  Depression screen PHQ 2/9  Decreased Interest 0 0 0 1 0  Down, Depressed, Hopeless 1 0 0 1 0  PHQ - 2 Score 1 0 0 2 0  Altered sleeping 3   1 0  Tired, decreased energy 3   1 0  Change in appetite 1   0 0  Feeling bad or failure about yourself  0   0 0  Trouble concentrating 0   0 0  Moving slowly or fidgety/restless 0   0 0  Suicidal thoughts 0   0 0  PHQ-9 Score 8   4 0  Difficult doing work/chores Not difficult at all   Not difficult at all Not difficult at all        07/29/2023    8:38 AM  Fall Risk   Falls in the past year? 0  Number falls in past yr: 0  Injury with Fall? 0  Risk for  fall due to : No Fall Risks  Follow up Falls evaluation completed;Falls prevention discussed    Patient Care Team: Blane Ohara, MD as PCP - General (Family Medicine) Marlowe Sax, RN as Case Manager (General Practice) Georgeanna Lea, MD as Consulting Physician (Cardiology)   Review of Systems  Constitutional:  Positive for fatigue. Negative for chills and fever.  HENT:  Negative for congestion, rhinorrhea and sore throat.   Respiratory:  Negative for cough and shortness of breath.   Cardiovascular:  Positive for palpitations. Negative for chest pain.  Gastrointestinal:  Negative for abdominal pain, constipation, diarrhea, nausea and vomiting.  Genitourinary:  Negative for dysuria and urgency.  Musculoskeletal:  Positive for arthralgias (left knee pain). Negative for back pain and myalgias.  Neurological:  Positive for light-headedness and numbness (left knee and lower leg). Negative for dizziness, weakness and headaches.  Psychiatric/Behavioral:  Positive for sleep disturbance. Negative for dysphoric mood. The patient is not nervous/anxious.     Current Outpatient Medications on File Prior to Visit  Medication Sig Dispense Refill   apixaban (ELIQUIS) 5  MG TABS tablet Take 1 tablet (5 mg total) by mouth 2 (two) times daily. 180 tablet 1   atorvastatin (LIPITOR) 20 MG tablet Take 1 tablet (20 mg total) by mouth daily. 90 tablet 1   cetirizine (ZYRTEC) 10 MG tablet Take 10 mg by mouth daily.     diltiazem (CARDIZEM) 30 MG tablet Take 30 mg by mouth every 6 (six) hours as needed.     EPINEPHrine 0.3 mg/0.3 mL IJ SOAJ injection Inject 0.3 mg into the muscle once.     Finerenone (KERENDIA) 10 MG TABS Take 1 tablet by mouth daily.     flecainide (TAMBOCOR) 50 MG tablet Take 1 tablet (50 mg total) by mouth 2 (two) times daily. 180 tablet 3   furosemide (LASIX) 20 MG tablet Take 1 tablet (20 mg total) by mouth daily. 90 tablet 1   glimepiride (AMARYL) 4 MG tablet Take 1 tablet (4 mg  total) by mouth 2 (two) times daily. 180 tablet 0   icosapent Ethyl (VASCEPA) 1 g capsule Take 2 capsules (2 g total) by mouth 2 (two) times daily. 360 capsule 1   Iron, Ferrous Sulfate, 325 (65 Fe) MG TABS Take 325 mg by mouth daily. 30 tablet 2   magnesium oxide (MAG-OX) 400 (240 Mg) MG tablet Take 800 mg by mouth 2 (two) times daily.     metoprolol succinate (TOPROL-XL) 50 MG 24 hr tablet Take 1 tablet (50 mg total) by mouth daily. Take with or immediately following a meal. 90 tablet 3   omeprazole (PRILOSEC) 20 MG capsule Take 1 capsule (20 mg total) by mouth 2 (two) times daily. 180 capsule 1   ondansetron (ZOFRAN ODT) 4 MG disintegrating tablet Take 1 tablet (4 mg total) by mouth every 8 (eight) hours as needed for nausea or vomiting. 30 tablet 0   promethazine (PHENERGAN) 25 MG tablet Take 1 tablet (25 mg total) by mouth every 8 (eight) hours as needed for nausea or vomiting. 20 tablet 0   Semaglutide, 1 MG/DOSE, 4 MG/3ML SOPN Inject 1 mg as directed once a week. 3 mL 1   No current facility-administered medications on file prior to visit.   Past Medical History:  Diagnosis Date   Angioedema 04/05/2018   Hypoglycemia due to type 2 diabetes mellitus (HCC) 08/21/2020   Mixed hyperlipidemia    Nontoxic multinodular goiter    Other vitamin B12 deficiency anemias    Primary insomnia 05/20/2020   Solitary pulmonary nodule    Type 2 diabetes mellitus with other specified complication [facility])    Past Surgical History:  Procedure Laterality Date   ABDOMINAL HYSTERECTOMY  06/1993   partial; still has both ovaries   HYSTEROTOMY     partial    Family History  Problem Relation Age of Onset   Coronary artery disease Mother    Glaucoma Mother    Colon cancer Father    Diabetes Sister    Atrial fibrillation Sister    Cancer Brother        renal   Kidney cancer Brother    Diabetes type II Brother    Coronary artery disease Brother    Diabetes Brother    Diabetes Brother    Coronary artery  disease Brother    Asthma Son        childhood asthma   Arrhythmia Son    Atrial fibrillation Son    Parkinsonism Son    Eczema Grandchild    Atopy Grandchild    Allergic rhinitis Neg  Hx    Angioedema Neg Hx    Immunodeficiency Neg Hx    Urticaria Neg Hx    Breast cancer Neg Hx    Social History   Socioeconomic History   Marital status: Widowed    Spouse name: Not on file   Number of children: 3   Years of education: Not on file   Highest education level: GED or equivalent  Occupational History   Occupation: Geographical information systems officer  Tobacco Use   Smoking status: Never   Smokeless tobacco: Never  Vaping Use   Vaping status: Never Used  Substance and Sexual Activity   Alcohol use: Never   Drug use: Never   Sexual activity: Not on file  Other Topics Concern   Not on file  Social History Narrative   Not on file   Social Determinants of Health   Financial Resource Strain: Low Risk  (07/23/2023)   Overall Financial Resource Strain (CARDIA)    Difficulty of Paying Living Expenses: Not very hard  Recent Concern: Financial Resource Strain - Medium Risk (06/02/2023)   Overall Financial Resource Strain (CARDIA)    Difficulty of Paying Living Expenses: Somewhat hard  Food Insecurity: No Food Insecurity (07/23/2023)   Hunger Vital Sign    Worried About Running Out of Food in the Last Year: Never true    Ran Out of Food in the Last Year: Never true  Transportation Needs: No Transportation Needs (07/23/2023)   PRAPARE - Administrator, Civil Service (Medical): No    Lack of Transportation (Non-Medical): No  Physical Activity: Inactive (07/23/2023)   Exercise Vital Sign    Days of Exercise per Week: 0 days    Minutes of Exercise per Session: 30 min  Stress: No Stress Concern Present (07/23/2023)   [employer] of Occupational Health - Occupational Stress Questionnaire    Feeling of Stress : Not at all  Social Connections: Moderately Isolated (07/23/2023)   Social Connection and  Isolation Panel [NHANES]    Frequency of Communication with Friends and Family: More than three times a week    Frequency of Social Gatherings with Friends and Family: Three times a week    Attends Religious Services: More than 4 times per year    Active Member of Clubs or Organizations: No    Attends Banker Meetings: Never    Marital Status: Widowed    Objective:  BP 120/78   Pulse 72   Temp (!) 97.2 F (36.2 C)   Resp 14   Ht 5\' 3"  (1.6 m)   Wt 152 lb 9.6 oz (69.2 kg)   LMP  (LMP Unknown)   BMI 27.03 kg/m      07/29/2023    8:30 AM 06/02/2023    3:53 PM 04/09/2023    8:16 AM  BP/Weight  Systolic BP 120 118 118  Diastolic BP 78 62 72  Wt. (Lbs) 152.6 158 158  BMI 27.03 kg/m2 27.99 kg/m2 27.99 kg/m2    Physical Exam Vitals reviewed.  Constitutional:      Appearance: Normal appearance. She is normal weight.  Neck:     Vascular: No carotid bruit.  Cardiovascular:     Rate and Rhythm: Normal rate and regular rhythm.     Heart sounds: Normal heart sounds.  Pulmonary:     Effort: Pulmonary effort is normal. No respiratory distress.     Breath sounds: Normal breath sounds.  Abdominal:     General: Abdomen is flat. Bowel sounds are normal.  Palpations: Abdomen is soft.     Tenderness: There is no abdominal tenderness.  Neurological:     Mental Status: She is alert and oriented to person, place, and time.  Psychiatric:        Mood and Affect: Mood normal.        Behavior: Behavior normal.     Diabetic Foot Exam - Simple   Simple Foot Form Diabetic Foot exam was performed with the following findings: Yes 07/29/2023  9:11 AM  Visual Inspection No deformities, no ulcerations, no other skin breakdown bilaterally: Yes Sensation Testing Intact to touch and monofilament testing bilaterally: Yes Pulse Check Posterior Tibialis and Dorsalis pulse intact bilaterally: Yes Comments      Lab Results  Component Value Date   WBC 9.3 07/29/2023   HGB 12.6  07/29/2023   HCT 39.5 07/29/2023   PLT 363 07/29/2023   GLUCOSE 114 (H) 07/29/2023   CHOL 139 07/29/2023   TRIG 262 (H) 07/29/2023   HDL 41 07/29/2023   LDLCALC 57 07/29/2023   ALT 22 07/29/2023   AST 21 07/29/2023   NA 140 07/29/2023   K 4.9 07/29/2023   CL 101 07/29/2023   CREATININE 0.90 07/29/2023   BUN 24 07/29/2023   CO2 22 07/29/2023   TSH 0.777 01/19/2023   HGBA1C 6.8 (H) 07/29/2023   MICROALBUR 150 10/27/2021      Assessment & Plan:    Diabetic glomerulopathy (HCC) Assessment & Plan: Control: Improved.  Recommend check sugars fasting daily. Recommend check feet daily. Recommend annual eye exams. Medicines: Continue Glimepiride 4 mg BID, Ozempic 1 mg  weekly, Kerendia 10 mg daily, Synjardy 1000 mg twice daily.   Continue to work on eating a healthy diet and exercise.  Labs drawn today.     Orders: -     CBC with Differential/Platelet -     Comprehensive metabolic panel -     Hemoglobin A1c -     Microalbumin / creatinine urine ratio  Mixed hyperlipidemia Assessment & Plan: Not at goal Continue atorvastatin 20 mg daily and vascepa 1 gm 2 capsules twice daily.  Continue to work on eating a healthy diet and exercise.  Labs drawn today.    Orders: -     Lipid panel  Hypertension associated with diabetes [facility]) Assessment & Plan: Well controlled.  No changes to medicines. Continue metoprolol 50 mg daily and Lasix 20 mg daily.  Continue Aspirin 81 mg daily. Continue to work on eating a healthy diet and exercise.  Labs drawn today.     GERD without esophagitis Assessment & Plan: The current medical regimen is effective;  continue present plan and medications.  Continue omeprazole 20 mg twice daily.     Hypomagnesemia Assessment & Plan: Check magnesium level.  Orders: -     Magnesium  Iron deficiency anemia, unspecified iron deficiency anemia type Assessment & Plan: Check CBC.  Orders: -     Iron, TIBC and Ferritin Panel  Abnormal urine  odor Assessment & Plan: Check urinalysis  Orders: -     POCT URINALYSIS DIP ([facility]IN[facility]K)  Encounter for hepatitis C screening test for low risk patient Assessment & Plan: Check for hepatitis C  Orders: -     HCV Ab w Reflex to Quant PCR  Chronic pain of left knee Assessment & Plan: Worsening.  Recommend call Dr. Vivia Ewing office to get another appointment.   Paroxysmal atrial fibrillation [facility]) Assessment & Plan: Continue current medications. Continue flecainide, metoprolol and eliquis.  No orders of the defined types were placed in this encounter.   Orders Placed This Encounter  Procedures   CBC with Differential/Platelet   Comprehensive metabolic panel   Hemoglobin A1c   Lipid panel   Microalbumin / creatinine urine ratio   Iron, TIBC and Ferritin Panel   Magnesium   HCV Ab w Reflex to Quant PCR   POCT URINALYSIS DIP (CLINITEK)     Follow-up: Return in about 4 months (around 11/28/2023) for chronic fasting.   I,Carolyn M Morrison,acting as a Neurosurgeon for Blane Ohara, MD.,have documented all relevant documentation on the behalf of Blane Ohara, MD,as directed by  Blane Ohara, MD while in the presence of Blane Ohara, MD.   An After Visit Summary was printed and given to the patient.  Blane Ohara, MD  Family Practice [phone]

## 2023-07-29 NOTE — Patient Instructions (Signed)
Recommend melatonin for insomnia

## 2023-07-31 NOTE — Assessment & Plan Note (Signed)
Check for hepatitis C.

## 2023-07-31 NOTE — Assessment & Plan Note (Signed)
Well controlled.  No changes to medicines. Continue metoprolol 50 mg daily and Lasix 20 mg daily.  Continue Aspirin 81 mg daily. Continue to work on eating a healthy diet and exercise.  Labs drawn today.

## 2023-07-31 NOTE — Assessment & Plan Note (Signed)
The current medical regimen is effective;  continue present plan and medications. Continue omeprazole 20 mg twice daily.  

## 2023-07-31 NOTE — Assessment & Plan Note (Signed)
Control: Improved.  Recommend check sugars fasting daily. Recommend check feet daily. Recommend annual eye exams. Medicines: Continue Glimepiride 4 mg BID, Ozempic 1 mg  weekly, Kerendia 10 mg daily, Synjardy 1000 mg twice daily.   Continue to work on eating a healthy diet and exercise.  Labs drawn today.

## 2023-07-31 NOTE — Assessment & Plan Note (Signed)
Not at goal Continue atorvastatin 20 mg daily and vascepa 1 gm 2 capsules twice daily.  Continue to work on eating a healthy diet and exercise.  Labs drawn today.

## 2023-07-31 NOTE — Assessment & Plan Note (Signed)
Check urinalysis. 

## 2023-07-31 NOTE — Assessment & Plan Note (Signed)
Check CBC 

## 2023-07-31 NOTE — Assessment & Plan Note (Signed)
Check magnesium level 

## 2023-07-31 NOTE — Assessment & Plan Note (Signed)
Continue current medications. Continue flecainide, metoprolol and eliquis.

## 2023-07-31 NOTE — Assessment & Plan Note (Signed)
Worsening.  Recommend call Dr. Vivia Ewing office to get another appointment.

## 2023-08-03 ENCOUNTER — Other Ambulatory Visit: Payer: Self-pay | Admitting: Family Medicine

## 2023-08-03 DIAGNOSIS — D508 Other iron deficiency anemias: Secondary | ICD-10-CM

## 2023-08-03 DIAGNOSIS — D509 Iron deficiency anemia, unspecified: Secondary | ICD-10-CM

## 2023-08-03 MED ORDER — IRON (FERROUS SULFATE) 325 (65 FE) MG PO TABS
325.0000 mg | ORAL_TABLET | Freq: Every day | ORAL | 0 refills | Status: DC
Start: 2023-08-03 — End: 2023-12-01

## 2023-08-04 ENCOUNTER — Other Ambulatory Visit: Payer: Self-pay

## 2023-08-04 NOTE — Patient Instructions (Signed)
Visit Information  Thank you for taking time to visit with me today. Please don't hesitate to contact me if I can be of assistance to you before our next scheduled telephone appointment.  Following are the goals we discussed today:   Goals Addressed             This Visit's Progress    RNCM Care Management (depression and grief) EXPECTED OUTCOME: MONITOR, SELF-MANAGE AND REDUCE SYMPTOMS OF Depression and Grief       Current Barriers:  Care Coordination needs related to GriefShare support and resources in a patient with depression and grief Chronic Disease Management support and education needs related to effective management of depression and grief  Planned Interventions: Evaluation of current treatment plan related to depression and grief and patient's adherence to plan as established by provider. The patient feels she is doing okay right now with her depression and grief.   The patient is stable and denies any new issues related to her grief and depression. Has good support system. Saw the pcp on 07-29-2023. No changes in the plan of care. Knows to call for new needs related to her depression and grief process. Advised patient to call the office for changes in mood, anxiety, depression, or acute changes in mental health Provided education to patient re: about the GriefShare and the availability of grief support due to the loss of her husband, mom, brother, and other family members over the last few years. Review of the GriefShare program and the resources available Reviewed scheduled/upcoming provider appointments including 12-01-2023 at 0820 am Discussed plans with patient for ongoing care management follow up and provided patient with direct contact information for care management team Advised patient to discuss changes in her mental health, depression and grief with provider. Reflective listening and support given Screening for signs and symptoms of depression related to chronic disease  state  Assessed social determinant of health barriers The patient denies any new issues related to UTI, or changes in her urinary function. Knows to call the office for changes.   Symptom Management: Attend all scheduled provider appointments Attend church or other social activities Work with the social worker to address care coordination needs and will continue to work with the clinical team to address health care and disease management related needs call the Suicide and Crisis Lifeline: 988 call the [place] National Suicide Prevention Lifeline: 9385466077 or TTY: (684)054-1276 TTY (970) 228-7184) to talk to a trained counselor call 1-800-273-TALK (toll free, 24 hour hotline) if experiencing a Mental Health or Behavioral Health Crisis   Follow Up Plan: Telephone follow up appointment with care management team member scheduled for: 10-06-2023 at 0900 am       RNCM Care Management Expected Outcome:  Monitor, Self-Manage and Reduce Symptoms of Afib       Current Barriers:  Knowledge Deficits related to AFIB and recent hospitalization for new onset of AFIB Care Coordination needs related to resources and management of AFIB in a patient with new onset of AFIB with hospitalization for AFIB with [facility] Chronic Disease Management support and education needs related to effective management of AFIB  Planned Interventions: Provider order and care plan reviewed. The patient feels that the current plan for effective management of her Afib is working well for her. She states that her AFIB is stable. Will see cardiologist for follow up in September. Overall no new concerns with her AFIB or heart health. Will also follow up with hematology for ongoing support and monitoring.  Counseled  on increased risk of stroke due to Afib and benefits of anticoagulation for stroke prevention. The patient is aware of the risk of Stroke due to AFIB, knows what Sx and Sx to look for           Reviewed importance of adherence to  anticoagulant exactly as prescribed. Is taking medications as directed and is also taking Magnesium 1600 mg daily. The cardiologist does not like this high dose but is trying to keep her levels up of magnesium. Is being followed closely. Education and support given.  Advised patient to discuss changes in her heart rate, changes in her sx and sx, questions and concerns with provider Counseled on bleeding risk associated with AFIB and importance of self-monitoring for signs/symptoms of bleeding Counseled on avoidance of NSAIDs due to increased bleeding risk with anticoagulants Counseled on importance of regular laboratory monitoring as prescribed. Had repeat labs last week for check on Magnesium levels Counseled on seeking medical attention after a head injury or if there is blood in the urine/stool Afib action plan reviewed. The patient is taking Eliquis 5 mg QD. She also has Cardizem to take prn. She has parameters to take the Cardizem. She is also taking Magnesium that is helping her as her Magnesium was low.  Screening for signs and symptoms of depression related to chronic disease state Assessed social determinant of health barriers  Symptom Management: Take medications as prescribed   Attend all scheduled provider appointments Call provider office for new concerns or questions  call the Suicide and Crisis Lifeline: 988 call the [place] National Suicide Prevention Lifeline: 906-623-8466 or TTY: (925)190-4814 TTY (225) 868-1504) to talk to a trained counselor call 1-800-273-TALK (toll free, 24 hour hotline) if experiencing a Mental Health or Behavioral Health Crisis  - check pulse (heart) rate before taking medicine - make a plan to eat healthy - keep all lab appointments - take medicine as prescribed  Follow Up Plan: Telephone follow up appointment with care management team member scheduled for: 10-9--2024 at 0900 am       RNCM Care Management Expected Outcome:  Monitor, Self-Manage, and  Reduce Symptoms of Hypertension       Current Barriers:  Chronic Disease Management support and education needs related to effective management of DM Financial Constraints.  BP Readings from Last 3 Encounters:  07/29/23 120/78  06/02/23 118/62  04/09/23 118/72     Planned Interventions: Evaluation of current treatment plan related to hypertension self management and patient's adherence to plan as established by provider. The patient is compliant with the plan of care. Denies any new concerns with HTN. Blood pressures are stable. Is stable right now with her Afib. The patient is doing well. Has follow up in September with cardiologist. The patient also has to call hematology to schedule an appointment. When she saw the pcp on August 1 it was recommended she follow up with hematology.  Provided education to patient re: stroke prevention, s/s of heart attack and stroke; Reviewed prescribed diet heart healthy/ADA diet. The patient is eating healthy. Sometimes eats late due to no appetite from the medications she is taking for her DM. Review of healthy snacks. The patient has been having issues with keeping her magnesium level up. Magnesium is causing her to have diarrhea but that is better since she is taking her iron every day. Education on watching for dietary changes and making sure she is following a heart healthy/ADA diet. Reviewed medications with patient and discussed importance of compliance. The patient  is compliant with medications. Review of pharm D changes and the support available if needed. The patient knows to let the [facility] know of any new needs related to medication needs and pharmacy support. ;  Discussed plans with patient for ongoing care management follow up and provided patient with direct contact information for care management team; Advised patient, providing education and rationale, to monitor blood pressure daily and record, calling PCP for findings outside established parameters;   Advised patient to discuss changes in blood pressure or other heart concerns  with provider; Provided education on prescribed diet heart healthy/ADA diet. Education on foods high in Magnesium: fish, greens, nuts, vegetables, beans ;  Discussed complications of poorly controlled blood pressure such as heart disease, stroke, circulatory complications, vision complications, kidney impairment, sexual dysfunction;  Next pcp appointment on 12-01-2023. Sees cardiologist on 09-15-2023 Denies any new needs at this time.   Symptom Management: Take medications as prescribed   Attend all scheduled provider appointments Call pharmacy for medication refills 3-7 days in advance of running out of medications Call provider office for new concerns or questions  call the Suicide and Crisis Lifeline: 988 call the [place] National Suicide Prevention Lifeline: 905-033-7244 or TTY: 205 723 9315 TTY 603-337-0956) to talk to a trained counselor call 1-800-273-TALK (toll free, 24 hour hotline) if experiencing a Mental Health or Behavioral Health Crisis  check blood pressure weekly learn about high blood pressure call doctor for signs and symptoms of high blood pressure develop an action plan for high blood pressure keep all doctor appointments take medications for blood pressure exactly as prescribed report new symptoms to your doctor  Follow Up Plan: Telephone follow up appointment with care management team member scheduled for: 10-06-2023 at 0900 am       RNCM: Care Management Expected Outcome:  Monitor, Self-Manage and Reduce Symptoms of Diabetes       Current Barriers:  Care Coordination needs related to loss of job and inability to pay for medications in a patient with DM Chronic Disease Management support and education needs related to effective management of DM Financial Constraints- working with pharm D for PAP assistance Difficulty obtaining medications  Lab Results  Component Value Date   HGBA1C 6.8  (H) 07/29/2023    Planned Interventions: Provided education to patient about basic DM disease process. Patients A1C is below goal at most recent visit. The patient saw pcp earlier in the month and remains on the current plan of care. Is compliant with the plan of care. Education and support given; Reviewed medications with patient and discussed importance of medication adherence. The patient was approved for Ozempic and is on 0.5mg . She is thankful that she gets financial assistance for Ozempic through 2024. The pcp has increased the dose of her Ozempic to 1 mg. She has also been approved for [facility] and is taking this now. Is seeing better readings. The patient aware of pharm D changes and will let the RNCM know of any new needs related to medications.  Reviewed prescribed diet with patient heart healthy/ADA diet. Review and education. The patient is eating a snack before bed. She states that she is not hungry and sometimes she forgets to eat until it is late.  Counseled on importance of regular laboratory monitoring as prescribed. Has regular lab work ;        Discussed plans with patient for ongoing care management follow up and provided patient with direct contact information for care management team;      Provided patient  with written educational materials related to hypo and hyperglycemia and importance of correct treatment. The highest she has seen lately is 150 recently, and the lowest was 88.    Reviewed scheduled/upcoming provider appointments including: 12-01-2023 with pcp Advised patient, providing education and rationale, to check cbg when you have symptoms of low or high blood sugar and as directed   and record. The patient checks her blood sugars. The patient states her readings have been around 130 to 150 with the lowest she has seen is 88. She is hoping they will start stabilizing out. Review of goal of fasting <130 and post prandial of <180     call provider for findings outside  established parameters;       Referral made to pharmacy team for assistance with financial difficulties of being able to afford medications for effective management of DM. Has been approved for Ozempic through 2024 and is thankful for this. Review of pharm D changes.  Referral made to social work team for assistance with financial barriers, grief and depression. Has been working with the LCSW;      Review of patient status, including review of consultants reports, relevant laboratory and other test results, and medications completed;       Advised patient to discuss changes in her DM health and well being with provider;      Screening for signs and symptoms of depression related to chronic disease state;        Assessed social determinant of health barriers;       Patient to schedule an appointment with the eye doctor after the first of the year.  Symptom Management: Take medications as prescribed   Attend all scheduled provider appointments Call pharmacy for medication refills 3-7 days in advance of running out of medications Call provider office for new concerns or questions  Work with the social worker to address care coordination needs and will continue to work with the clinical team to address health care and disease management related needs call the Suicide and Crisis Lifeline: 988 call the [place] National Suicide Prevention Lifeline: 561-825-2375 or TTY: (548)054-3253 TTY 7061490194) to talk to a trained counselor call 1-800-273-TALK (toll free, 24 hour hotline) if experiencing a Mental Health or Behavioral Health Crisis  schedule appointment with eye doctor check feet daily for cuts, sores or redness trim toenails straight across fill half of plate with vegetables manage portion size prepare main meal at home 3 to 5 days each week wash and dry feet carefully every day wear comfortable, cotton socks wear comfortable, well-fitting shoes  Follow Up Plan: Telephone follow up  appointment with care management team member scheduled for: 10-06-2023 at 0900 am           Our next appointment is by telephone on 10-06-2023 at 9 am  Please call the care guide team at [phone] if you need to cancel or reschedule your appointment.   If you are experiencing a Mental Health or Behavioral Health Crisis or need someone to talk to, please call the Suicide and Crisis Lifeline: 988 call the [place] National Suicide Prevention Lifeline: 727-162-7053 or TTY: 971-664-7693 TTY 605-733-6897) to talk to a trained counselor call 1-800-273-TALK (toll free, 24 hour hotline) go to [facility] [address], [place] [phone])   Patient verbalizes understanding of instructions and care plan provided today and agrees to view in MyChart. Active MyChart status and patient understanding of how to access instructions and care plan via MyChart confirmed  with patient.       Alto Denver RN, MSN, CCM RN Care Manager  [facility]  Ambulatory Care Management  Direct Number: [phone]

## 2023-08-04 NOTE — Patient Outreach (Signed)
Care Management   Visit Note  08/04/2023 Name: Rachel Hensley MRN: [id number] DOB: 10-Nov-1956  Subjective: Rachel Hensley is a 67 y.o. year old female who is a primary care patient of Cox, Kirsten, MD. The Care Management team was consulted for assistance.      Engaged with patient spoke with patient by telephone.    Goals Addressed             This Visit's Progress    RNCM Care Management (depression and grief) EXPECTED OUTCOME: MONITOR, SELF-MANAGE AND REDUCE SYMPTOMS OF Depression and Grief       Current Barriers:  Care Coordination needs related to GriefShare support and resources in a patient with depression and grief Chronic Disease Management support and education needs related to effective management of depression and grief  Planned Interventions: Evaluation of current treatment plan related to depression and grief and patient's adherence to plan as established by provider. The patient feels she is doing okay right now with her depression and grief.   The patient is stable and denies any new issues related to her grief and depression. Has good support system. Saw the pcp on 07-29-2023. No changes in the plan of care. Knows to call for new needs related to her depression and grief process. Advised patient to call the office for changes in mood, anxiety, depression, or acute changes in mental health Provided education to patient re: about the GriefShare and the availability of grief support due to the loss of her husband, mom, brother, and other family members over the last few years. Review of the GriefShare program and the resources available Reviewed scheduled/upcoming provider appointments including 12-01-2023 at 0820 am Discussed plans with patient for ongoing care management follow up and provided patient with direct contact information for care management team Advised patient to discuss changes in her mental health, depression and grief with provider. Reflective listening  and support given Screening for signs and symptoms of depression related to chronic disease state  Assessed social determinant of health barriers The patient denies any new issues related to UTI, or changes in her urinary function. Knows to call the office for changes.   Symptom Management: Attend all scheduled provider appointments Attend church or other social activities Work with the social worker to address care coordination needs and will continue to work with the clinical team to address health care and disease management related needs call the Suicide and Crisis Lifeline: 988 call the [place] National Suicide Prevention Lifeline: 978-314-3765 or TTY: 430-687-1029 TTY (828)272-4709) to talk to a trained counselor call 1-800-273-TALK (toll free, 24 hour hotline) if experiencing a Mental Health or Behavioral Health Crisis   Follow Up Plan: Telephone follow up appointment with care management team member scheduled for: 10-06-2023 at 0900 am       RNCM Care Management Expected Outcome:  Monitor, Self-Manage and Reduce Symptoms of Afib       Current Barriers:  Knowledge Deficits related to AFIB and recent hospitalization for new onset of AFIB Care Coordination needs related to resources and management of AFIB in a patient with new onset of AFIB with hospitalization for AFIB with [facility] Chronic Disease Management support and education needs related to effective management of AFIB  Planned Interventions: Provider order and care plan reviewed. The patient feels that the current plan for effective management of her Afib is working well for her. She states that her AFIB is stable. Will see cardiologist for follow up in September. Overall no new concerns  with her AFIB or heart health. Will also follow up with hematology for ongoing support and monitoring.  Counseled on increased risk of stroke due to Afib and benefits of anticoagulation for stroke prevention. The patient is aware of the risk of Stroke  due to AFIB, knows what Sx and Sx to look for           Reviewed importance of adherence to anticoagulant exactly as prescribed. Is taking medications as directed and is also taking Magnesium 1600 mg daily. The cardiologist does not like this high dose but is trying to keep her levels up of magnesium. Is being followed closely. Education and support given.  Advised patient to discuss changes in her heart rate, changes in her sx and sx, questions and concerns with provider Counseled on bleeding risk associated with AFIB and importance of self-monitoring for signs/symptoms of bleeding Counseled on avoidance of NSAIDs due to increased bleeding risk with anticoagulants Counseled on importance of regular laboratory monitoring as prescribed. Had repeat labs last week for check on Magnesium levels Counseled on seeking medical attention after a head injury or if there is blood in the urine/stool Afib action plan reviewed. The patient is taking Eliquis 5 mg QD. She also has Cardizem to take prn. She has parameters to take the Cardizem. She is also taking Magnesium that is helping her as her Magnesium was low.  Screening for signs and symptoms of depression related to chronic disease state Assessed social determinant of health barriers  Symptom Management: Take medications as prescribed   Attend all scheduled provider appointments Call provider office for new concerns or questions  call the Suicide and Crisis Lifeline: 988 call the [place] National Suicide Prevention Lifeline: 256-283-3470 or TTY: (406)688-6305 TTY 503-762-7369) to talk to a trained counselor call 1-800-273-TALK (toll free, 24 hour hotline) if experiencing a Mental Health or Behavioral Health Crisis  - check pulse (heart) rate before taking medicine - make a plan to eat healthy - keep all lab appointments - take medicine as prescribed  Follow Up Plan: Telephone follow up appointment with care management team member scheduled for:  10-9--2024 at 0900 am       RNCM Care Management Expected Outcome:  Monitor, Self-Manage, and Reduce Symptoms of Hypertension       Current Barriers:  Chronic Disease Management support and education needs related to effective management of DM Financial Constraints.  BP Readings from Last 3 Encounters:  07/29/23 120/78  06/02/23 118/62  04/09/23 118/72     Planned Interventions: Evaluation of current treatment plan related to hypertension self management and patient's adherence to plan as established by provider. The patient is compliant with the plan of care. Denies any new concerns with HTN. Blood pressures are stable. Is stable right now with her Afib. The patient is doing well. Has follow up in September with cardiologist. The patient also has to call hematology to schedule an appointment. When she saw the pcp on August 1 it was recommended she follow up with hematology.  Provided education to patient re: stroke prevention, s/s of heart attack and stroke; Reviewed prescribed diet heart healthy/ADA diet. The patient is eating healthy. Sometimes eats late due to no appetite from the medications she is taking for her DM. Review of healthy snacks. The patient has been having issues with keeping her magnesium level up. Magnesium is causing her to have diarrhea but that is better since she is taking her iron every day. Education on watching for dietary changes and making  sure she is following a heart healthy/ADA diet. Reviewed medications with patient and discussed importance of compliance. The patient is compliant with medications. Review of pharm D changes and the support available if needed. The patient knows to let the [facility] know of any new needs related to medication needs and pharmacy support. ;  Discussed plans with patient for ongoing care management follow up and provided patient with direct contact information for care management team; Advised patient, providing education and rationale, to  monitor blood pressure daily and record, calling PCP for findings outside established parameters;  Advised patient to discuss changes in blood pressure or other heart concerns  with provider; Provided education on prescribed diet heart healthy/ADA diet. Education on foods high in Magnesium: fish, greens, nuts, vegetables, beans ;  Discussed complications of poorly controlled blood pressure such as heart disease, stroke, circulatory complications, vision complications, kidney impairment, sexual dysfunction;  Next pcp appointment on 12-01-2023. Sees cardiologist on 09-15-2023 Denies any new needs at this time.   Symptom Management: Take medications as prescribed   Attend all scheduled provider appointments Call pharmacy for medication refills 3-7 days in advance of running out of medications Call provider office for new concerns or questions  call the Suicide and Crisis Lifeline: 988 call the [place] National Suicide Prevention Lifeline: 548-616-1028 or TTY: 223 801 5733 TTY (218) 506-0560) to talk to a trained counselor call 1-800-273-TALK (toll free, 24 hour hotline) if experiencing a Mental Health or Behavioral Health Crisis  check blood pressure weekly learn about high blood pressure call doctor for signs and symptoms of high blood pressure develop an action plan for high blood pressure keep all doctor appointments take medications for blood pressure exactly as prescribed report new symptoms to your doctor  Follow Up Plan: Telephone follow up appointment with care management team member scheduled for: 10-06-2023 at 0900 am       RNCM: Care Management Expected Outcome:  Monitor, Self-Manage and Reduce Symptoms of Diabetes       Current Barriers:  Care Coordination needs related to loss of job and inability to pay for medications in a patient with DM Chronic Disease Management support and education needs related to effective management of DM Financial Constraints- working with pharm D for PAP  assistance Difficulty obtaining medications  Lab Results  Component Value Date   HGBA1C 6.8 (H) 07/29/2023    Planned Interventions: Provided education to patient about basic DM disease process. Patients A1C is below goal at most recent visit. The patient saw pcp earlier in the month and remains on the current plan of care. Is compliant with the plan of care. Education and support given; Reviewed medications with patient and discussed importance of medication adherence. The patient was approved for Ozempic and is on 0.5mg . She is thankful that she gets financial assistance for Ozempic through 2024. The pcp has increased the dose of her Ozempic to 1 mg. She has also been approved for [facility] and is taking this now. Is seeing better readings. The patient aware of pharm D changes and will let the RNCM know of any new needs related to medications.  Reviewed prescribed diet with patient heart healthy/ADA diet. Review and education. The patient is eating a snack before bed. She states that she is not hungry and sometimes she forgets to eat until it is late.  Counseled on importance of regular laboratory monitoring as prescribed. Has regular lab work ;        Discussed plans with patient for ongoing care management follow  up and provided patient with direct contact information for care management team;      Provided patient with written educational materials related to hypo and hyperglycemia and importance of correct treatment. The highest she has seen lately is 150 recently, and the lowest was 88.    Reviewed scheduled/upcoming provider appointments including: 12-01-2023 with pcp Advised patient, providing education and rationale, to check cbg when you have symptoms of low or high blood sugar and as directed   and record. The patient checks her blood sugars. The patient states her readings have been around 130 to 150 with the lowest she has seen is 88. She is hoping they will start stabilizing out. Review  of goal of fasting <130 and post prandial of <180     call provider for findings outside established parameters;       Referral made to pharmacy team for assistance with financial difficulties of being able to afford medications for effective management of DM. Has been approved for Ozempic through 2024 and is thankful for this. Review of pharm D changes.  Referral made to social work team for assistance with financial barriers, grief and depression. Has been working with the LCSW;      Review of patient status, including review of consultants reports, relevant laboratory and other test results, and medications completed;       Advised patient to discuss changes in her DM health and well being with provider;      Screening for signs and symptoms of depression related to chronic disease state;        Assessed social determinant of health barriers;       Patient to schedule an appointment with the eye doctor after the first of the year.  Symptom Management: Take medications as prescribed   Attend all scheduled provider appointments Call pharmacy for medication refills 3-7 days in advance of running out of medications Call provider office for new concerns or questions  Work with the social worker to address care coordination needs and will continue to work with the clinical team to address health care and disease management related needs call the Suicide and Crisis Lifeline: 988 call the [place] National Suicide Prevention Lifeline: 4257359809 or TTY: 904-690-8939 TTY 920-045-2091) to talk to a trained counselor call 1-800-273-TALK (toll free, 24 hour hotline) if experiencing a Mental Health or Behavioral Health Crisis  schedule appointment with eye doctor check feet daily for cuts, sores or redness trim toenails straight across fill half of plate with vegetables manage portion size prepare main meal at home 3 to 5 days each week wash and dry feet carefully every day wear comfortable, cotton  socks wear comfortable, well-fitting shoes  Follow Up Plan: Telephone follow up appointment with care management team member scheduled for: 10-06-2023 at 0900 am           Consent to Services:  Patient was given information about care management services, agreed to services, and gave verbal consent to participate.   Plan: Telephone follow up appointment with care management team member scheduled for:  10-06-2023 at 0900 am  Alto Denver RN, MSN, CCM RN Care Manager  [facility]  Ambulatory Care Management  Direct Number: [phone]

## 2023-08-05 ENCOUNTER — Other Ambulatory Visit (HOSPITAL_COMMUNITY): Payer: Self-pay

## 2023-08-05 ENCOUNTER — Other Ambulatory Visit: Payer: Self-pay

## 2023-08-09 ENCOUNTER — Telehealth: Payer: PPO

## 2023-08-15 NOTE — Progress Notes (Unsigned)
[facility]  [address] [place],  [place]  [address] [phone]  Clinic Day:  08/16/2023  Referring physician: Blane Ohara, MD   HISTORY OF PRESENT ILLNESS:  The patient is a 67 y.o. female  who I was asked to consult upon for iron deficiency anemia.  Recent labs showed a hemoglobin of 12.6, with a low normal MCV of 82.  However, iron studies done recently showed a low ferritin of 10, a serum iron of 39, an elevated TIBC of 508, and a low iron saturation of 8%.  The patient denies having any overt forms of blood loss to explain her iron deficiency.  Of note, this patient was seen by me in 2019 for the same problem.  She will add oral iron, instead of taking IV iron, to try to replenish her iron stores.  She claims she had to have open EGD and colonoscopy within the past 2 years which did not show any adverse GI tract pathology.  She has been taking oral iron since December 2023, but admits that it does cause a lot of GI discomfort.  To her knowledge, she is not aware of any family history of anemia or other hematologic disorders.  PAST MEDICAL HISTORY:   Past Medical History:  Diagnosis Date   A-fib [facility])    Angioedema 04/05/2018   GERD (gastroesophageal reflux disease)    History of migraine    Hyperlipidemia    Hypertension    Hypoglycemia due to type 2 diabetes mellitus (HCC) 08/21/2020   Mixed hyperlipidemia    Nontoxic multinodular goiter    Other vitamin B12 deficiency anemias    Primary insomnia 05/20/2020   Solitary pulmonary nodule    Type 2 diabetes mellitus with other specified complication (HCC)     PAST SURGICAL HISTORY:   Past Surgical History:  Procedure Laterality Date   ABDOMINAL HYSTERECTOMY  06/1993   partial; still has both ovaries   HYSTEROTOMY     partial    CURRENT MEDICATIONS:   Current Outpatient Medications  Medication Sig Dispense Refill   apixaban (ELIQUIS) 5 MG TABS tablet Take 1 tablet (5 mg total) by  mouth 2 (two) times daily. 180 tablet 1   atorvastatin (LIPITOR) 20 MG tablet Take 1 tablet (20 mg total) by mouth daily. 90 tablet 1   cetirizine (ZYRTEC) 10 MG tablet Take 10 mg by mouth daily.     diltiazem (CARDIZEM) 30 MG tablet Take 30 mg by mouth every 6 (six) hours as needed.     EPINEPHrine 0.3 mg/0.3 mL IJ SOAJ injection Inject 0.3 mg into the muscle once.     Finerenone (KERENDIA) 10 MG TABS Take 1 tablet by mouth daily.     flecainide (TAMBOCOR) 50 MG tablet Take 1 tablet (50 mg total) by mouth 2 (two) times daily. 180 tablet 3   furosemide (LASIX) 20 MG tablet Take 1 tablet (20 mg total) by mouth daily. 90 tablet 1   glimepiride (AMARYL) 4 MG tablet Take 1 tablet (4 mg total) by mouth 2 (two) times daily. 180 tablet 0   icosapent Ethyl (VASCEPA) 1 g capsule Take 2 capsules (2 g total) by mouth 2 (two) times daily. 360 capsule 1   Iron, Ferrous Sulfate, 325 (65 Fe) MG TABS Take 325 mg by mouth daily. 90 tablet 0   magnesium oxide (MAG-OX) 400 (240 Mg) MG tablet Take 800 mg by mouth 2 (two) times daily.     metoprolol succinate (TOPROL-XL) 50  MG 24 hr tablet Take 1 tablet (50 mg total) by mouth daily. Take with or immediately following a meal. 90 tablet 3   omeprazole (PRILOSEC) 20 MG capsule Take 1 capsule (20 mg total) by mouth 2 (two) times daily. 180 capsule 1   ondansetron (ZOFRAN ODT) 4 MG disintegrating tablet Take 1 tablet (4 mg total) by mouth every 8 (eight) hours as needed for nausea or vomiting. 30 tablet 0   promethazine (PHENERGAN) 25 MG tablet Take 1 tablet (25 mg total) by mouth every 8 (eight) hours as needed for nausea or vomiting. 20 tablet 0   Semaglutide, 1 MG/DOSE, 4 MG/3ML SOPN Inject 1 mg as directed once a week. 3 mL 1   No current facility-administered medications for this visit.    ALLERGIES:   Allergies  Allergen Reactions   Actos [Pioglitazone] Other (See Comments)    Weight gain/ edema   Fenofibrate     diarrhea   Lisinopril Other (See Comments)     angioedema   Prednisone Swelling    FAMILY HISTORY:   Family History  Problem Relation Age of Onset   Coronary artery disease Mother    Glaucoma Mother    Colon cancer Father    Diabetes Sister    Atrial fibrillation Sister    Kidney cancer Brother    Diabetes type II Brother    Diabetes Brother    Prostate cancer Brother    Diabetes Brother    Coronary artery disease Brother    Asthma Son        childhood asthma   Arrhythmia Son    Atrial fibrillation Son    Parkinsonism Son    Eczema Grandchild    Atopy Grandchild    Colon cancer Paternal Uncle    Throat cancer Cousin    Lymphoma Maternal Aunt    Breast cancer Paternal Aunt    Allergic rhinitis Neg Hx    Angioedema Neg Hx    Immunodeficiency Neg Hx    Urticaria Neg Hx     SOCIAL HISTORY:  The patient was born and raised in [place].  She currently is widowed, with 3 children and 5 grandchildren.  She was a Chief Executive Officer for 45 years.  There is no history of alcoholism or tobacco abuse.  REVIEW OF SYSTEMS:  Review of Systems  Constitutional:  Negative for fatigue and fever.  HENT:   Negative for hearing loss and sore throat.   Eyes:  Negative for eye problems.  Respiratory:  Negative for chest tightness, cough and hemoptysis.   Cardiovascular:  Positive for palpitations. Negative for chest pain.  Gastrointestinal:  Positive for constipation and diarrhea. Negative for abdominal distention, abdominal pain, blood in stool, nausea and vomiting.  Endocrine: Negative for hot flashes.  Genitourinary:  Negative for difficulty urinating, dysuria, frequency, hematuria and nocturia.   Musculoskeletal:  Positive for arthralgias. Negative for back pain, gait problem and myalgias.  Skin: Negative.  Negative for itching and rash.  Neurological:  Positive for light-headedness. Negative for dizziness, extremity weakness, gait problem, headaches and numbness.  Hematological: Negative.   Psychiatric/Behavioral: Negative.   Negative for depression and suicidal ideas. The patient is not nervous/anxious.    PHYSICAL EXAM:  Blood pressure (!) 141/70, pulse 76, temperature 98.5 F (36.9 C), resp. rate 16, height 5\' 3"  (1.6 m), weight 151 lb 11.2 oz (68.8 kg), SpO2 96%. Wt Readings from Last 3 Encounters:  08/16/23 151 lb 11.2 oz (68.8 kg)  07/29/23 152 lb 9.6 oz (  69.2 kg)  06/02/23 158 lb (71.7 kg)   Body mass index is 26.87 kg/m. Performance status (ECOG): 1 - Symptomatic but completely ambulatory Physical Exam Constitutional:      Appearance: Normal appearance. She is not ill-appearing.  HENT:     Mouth/Throat:     Mouth: Mucous membranes are moist.     Pharynx: Oropharynx is clear. No oropharyngeal exudate or posterior oropharyngeal erythema.  Cardiovascular:     Rate and Rhythm: Normal rate and regular rhythm.     Heart sounds: No murmur heard.    No friction rub. No gallop.  Pulmonary:     Effort: Pulmonary effort is normal. No respiratory distress.     Breath sounds: Normal breath sounds. No wheezing, rhonchi or rales.  Abdominal:     General: Bowel sounds are normal. There is no distension.     Palpations: Abdomen is soft. There is no mass.     Tenderness: There is no abdominal tenderness.  Musculoskeletal:        General: No swelling.     Right lower leg: No edema.     Left lower leg: No edema.  Lymphadenopathy:     Cervical: No cervical adenopathy.     Upper Body:     Right upper body: No supraclavicular or axillary adenopathy.     Left upper body: No supraclavicular or axillary adenopathy.     Lower Body: No right inguinal adenopathy. No left inguinal adenopathy.  Skin:    General: Skin is warm.     Coloration: Skin is not jaundiced.     Findings: No lesion or rash.  Neurological:     General: No focal deficit present.     Mental Status: She is alert and oriented to person, place, and time. Mental status is at baseline.  Psychiatric:        Mood and Affect: Mood normal.         Behavior: Behavior normal.        Thought Content: Thought content normal.    LABS:      Latest Ref Rng & Units 07/29/2023    9:16 AM 04/09/2023    8:47 AM 12/07/2022    8:13 AM  CBC  WBC 3.4 - 10.8 x10E3/uL 9.3  8.7  7.5   Hemoglobin 11.1 - 15.9 g/dL 13.2  44.0  10.2   Hematocrit 34.0 - 46.6 % 39.5  37.8  34.4   Platelets 150 - 450 x10E3/uL 363  344  327       Latest Ref Rng & Units 07/29/2023    9:16 AM 04/09/2023    8:47 AM 02/19/2023   11:14 AM  CMP  Glucose 70 - 99 mg/dL 725  366  440   BUN 8 - 27 mg/dL 24  23  20    Creatinine 0.57 - 1.00 mg/dL 3.47  4.25  9.56   Sodium 134 - 144 mmol/L 140  139  139   Potassium 3.5 - 5.2 mmol/L 4.9  5.0  4.8   Chloride 96 - 106 mmol/L 101  98  99   CO2 20 - 29 mmol/L 22  23  23    Calcium 8.7 - 10.3 mg/dL 38.7  56.4  33.2   Total Protein 6.0 - 8.5 g/dL 7.2  7.6    Total Bilirubin 0.0 - 1.2 mg/dL 0.4  0.4    Alkaline Phos 44 - 121 IU/L 94  108    AST 0 - 40 IU/L 21  23  ALT 0 - 32 IU/L 22  25      Latest Reference Range & Units 07/29/23 09:16  Iron 27 - 139 ug/dL 39  UIBC 161 - 096 ug/dL 045 (H)  TIBC 409 - 811 ug/dL 914 (H)  Ferritin 15 - 150 ng/mL 10 (L)  Iron Saturation 15 - 55 % 8 (LL)  (LL): Data is critically low (H): Data is abnormally high (L): Data is abnormally low  ASSESSMENT & PLAN:  A 67 y.o. female who I was asked to consult upon for iron deficiency anemia.  As her iron parameters remain low despite being on oral iron for multiple months, I will arrange for her to receive IV iron over these next few weeks to rapidly replenish her iron stores and normalize her hemoglobin.  As mentioned previously, she has had a GI workup within the past few years that came back unremarkable.  I will see her back in 3 months to reassess her iron and hemoglobin levels to see how well she responded to her upcoming IV iron.  The patient understands all the plans discussed today and is in agreement with them.  I do appreciate Cox, Kirsten, MD  for his new consult.   Jenai Scaletta Kirby Funk, MD

## 2023-08-16 ENCOUNTER — Encounter: Payer: Self-pay | Admitting: Oncology

## 2023-08-16 ENCOUNTER — Inpatient Hospital Stay: Payer: PPO | Attending: Oncology | Admitting: Oncology

## 2023-08-16 ENCOUNTER — Other Ambulatory Visit: Payer: Self-pay | Admitting: Oncology

## 2023-08-16 ENCOUNTER — Inpatient Hospital Stay: Payer: PPO

## 2023-08-16 VITALS — BP 141/70 | HR 76 | Temp 98.5°F | Resp 16 | Ht 63.0 in | Wt 151.7 lb

## 2023-08-16 DIAGNOSIS — Z800 Family history of malignant neoplasm of digestive organs: Secondary | ICD-10-CM | POA: Diagnosis not present

## 2023-08-16 DIAGNOSIS — D509 Iron deficiency anemia, unspecified: Secondary | ICD-10-CM | POA: Diagnosis not present

## 2023-08-16 DIAGNOSIS — Z807 Family history of other malignant neoplasms of lymphoid, hematopoietic and related tissues: Secondary | ICD-10-CM

## 2023-08-16 DIAGNOSIS — Z8051 Family history of malignant neoplasm of kidney: Secondary | ICD-10-CM

## 2023-08-16 DIAGNOSIS — Z803 Family history of malignant neoplasm of breast: Secondary | ICD-10-CM

## 2023-08-16 DIAGNOSIS — Z8042 Family history of malignant neoplasm of prostate: Secondary | ICD-10-CM

## 2023-08-17 ENCOUNTER — Telehealth: Payer: Self-pay | Admitting: Oncology

## 2023-08-17 ENCOUNTER — Telehealth: Payer: Self-pay

## 2023-08-17 NOTE — Telephone Encounter (Signed)
Refill form faxed to [employer] per their request for Ozempic patient assistance

## 2023-08-17 NOTE — Telephone Encounter (Signed)
08/17/23 Spoke with patient and confirmed all appts

## 2023-08-18 MED FILL — Ferumoxytol Inj 510 MG/17ML (30 MG/ML) (Elemental Fe): INTRAVENOUS | Qty: 17 | Status: AC

## 2023-08-19 ENCOUNTER — Inpatient Hospital Stay: Payer: PPO

## 2023-08-19 VITALS — BP 155/56 | HR 76 | Temp 98.3°F | Resp 18 | Wt 152.0 lb

## 2023-08-19 DIAGNOSIS — D508 Other iron deficiency anemias: Secondary | ICD-10-CM

## 2023-08-19 DIAGNOSIS — D509 Iron deficiency anemia, unspecified: Secondary | ICD-10-CM | POA: Diagnosis present

## 2023-08-19 MED ORDER — SODIUM CHLORIDE 0.9 % IV SOLN: Freq: Once | INTRAVENOUS | Status: AC

## 2023-08-19 MED ORDER — SODIUM CHLORIDE 0.9 % IV SOLN
510.0000 mg | Freq: Once | INTRAVENOUS | Status: AC
  Administered 2023-08-19: 510 mg via INTRAVENOUS
  Filled 2023-08-19: qty 510

## 2023-08-19 NOTE — Patient Instructions (Signed)
 Ferumoxytol Injection What is this medication? FERUMOXYTOL (FER ue MOX i tol) treats low levels of iron in your body (iron deficiency anemia). Iron is a mineral that plays an important role in making red blood cells, which carry oxygen from your lungs to the rest of your body. This medicine may be used for other purposes; ask your health care provider or pharmacist if you have questions. COMMON BRAND NAME(S): Feraheme What should I tell my care team before I take this medication? They need to know if you have any of these conditions: Anemia not caused by low iron levels High levels of iron in the blood Magnetic resonance imaging (MRI) test scheduled An unusual or allergic reaction to iron, other medications, foods, dyes, or preservatives Pregnant or trying to get pregnant Breastfeeding How should I use this medication? This medication is injected into a vein. It is given by your care team in a hospital or clinic setting. Talk to your care team the use of this medication in children. Special care may be needed. Overdosage: If you think you have taken too much of this medicine contact a poison control center or emergency room at once. NOTE: This medicine is only for you. Do not share this medicine with others. What if I miss a dose? It is important not to miss your dose. Call your care team if you are unable to keep an appointment. What may interact with this medication? Other iron products This list may not describe all possible interactions. Give your health care provider a list of all the medicines, herbs, non-prescription drugs, or dietary supplements you use. Also tell them if you smoke, drink alcohol, or use illegal drugs. Some items may interact with your medicine. What should I watch for while using this medication? Visit your care team regularly. Tell your care team if your symptoms do not start to get better or if they get worse. You may need blood work done while you are taking this  medication. You may need to follow a special diet. Talk to your care team. Foods that contain iron include: whole grains/cereals, dried fruits, beans, or peas, leafy green vegetables, and organ meats (liver, kidney). What side effects may I notice from receiving this medication? Side effects that you should report to your care team as soon as possible: Allergic reactions--skin rash, itching, hives, swelling of the face, lips, tongue, or throat Low blood pressure--dizziness, feeling faint or lightheaded, blurry vision Shortness of breath Side effects that usually do not require medical attention (report to your care team if they continue or are bothersome): Flushing Headache Joint pain Muscle pain Nausea Pain, redness, or irritation at injection site This list may not describe all possible side effects. Call your doctor for medical advice about side effects. You may report side effects to FDA at 1-800-FDA-1088. Where should I keep my medication? This medication is given in a hospital or clinic. It will not be stored at home. NOTE: This sheet is a summary. It may not cover all possible information. If you have questions about this medicine, talk to your doctor, pharmacist, or health care provider.  2024 Elsevier/Gold Standard (2023-05-21 00:00:00)

## 2023-08-23 MED FILL — Ferumoxytol Inj 510 MG/17ML (30 MG/ML) (Elemental Fe): INTRAVENOUS | Qty: 17 | Status: AC

## 2023-08-24 ENCOUNTER — Inpatient Hospital Stay: Payer: PPO

## 2023-08-24 ENCOUNTER — Telehealth: Payer: Self-pay

## 2023-08-24 VITALS — BP 120/62 | HR 74 | Temp 98.5°F | Resp 16 | Ht 63.0 in | Wt 151.0 lb

## 2023-08-24 DIAGNOSIS — D509 Iron deficiency anemia, unspecified: Secondary | ICD-10-CM | POA: Diagnosis not present

## 2023-08-24 DIAGNOSIS — D508 Other iron deficiency anemias: Secondary | ICD-10-CM

## 2023-08-24 MED ORDER — SODIUM CHLORIDE 0.9 % IV SOLN: Freq: Once | INTRAVENOUS | Status: AC

## 2023-08-24 MED ORDER — SODIUM CHLORIDE 0.9 % IV SOLN
510.0000 mg | Freq: Once | INTRAVENOUS | Status: AC
  Administered 2023-08-24: 510 mg via INTRAVENOUS
  Filled 2023-08-24: qty 510

## 2023-08-24 NOTE — Patient Instructions (Signed)
 Ferumoxytol Injection What is this medication? FERUMOXYTOL (FER ue MOX i tol) treats low levels of iron in your body (iron deficiency anemia). Iron is a mineral that plays an important role in making red blood cells, which carry oxygen from your lungs to the rest of your body. This medicine may be used for other purposes; ask your health care provider or pharmacist if you have questions. COMMON BRAND NAME(S): Feraheme What should I tell my care team before I take this medication? They need to know if you have any of these conditions: Anemia not caused by low iron levels High levels of iron in the blood Magnetic resonance imaging (MRI) test scheduled An unusual or allergic reaction to iron, other medications, foods, dyes, or preservatives Pregnant or trying to get pregnant Breastfeeding How should I use this medication? This medication is injected into a vein. It is given by your care team in a hospital or clinic setting. Talk to your care team the use of this medication in children. Special care may be needed. Overdosage: If you think you have taken too much of this medicine contact a poison control center or emergency room at once. NOTE: This medicine is only for you. Do not share this medicine with others. What if I miss a dose? It is important not to miss your dose. Call your care team if you are unable to keep an appointment. What may interact with this medication? Other iron products This list may not describe all possible interactions. Give your health care provider a list of all the medicines, herbs, non-prescription drugs, or dietary supplements you use. Also tell them if you smoke, drink alcohol, or use illegal drugs. Some items may interact with your medicine. What should I watch for while using this medication? Visit your care team regularly. Tell your care team if your symptoms do not start to get better or if they get worse. You may need blood work done while you are taking this  medication. You may need to follow a special diet. Talk to your care team. Foods that contain iron include: whole grains/cereals, dried fruits, beans, or peas, leafy green vegetables, and organ meats (liver, kidney). What side effects may I notice from receiving this medication? Side effects that you should report to your care team as soon as possible: Allergic reactions--skin rash, itching, hives, swelling of the face, lips, tongue, or throat Low blood pressure--dizziness, feeling faint or lightheaded, blurry vision Shortness of breath Side effects that usually do not require medical attention (report to your care team if they continue or are bothersome): Flushing Headache Joint pain Muscle pain Nausea Pain, redness, or irritation at injection site This list may not describe all possible side effects. Call your doctor for medical advice about side effects. You may report side effects to FDA at 1-800-FDA-1088. Where should I keep my medication? This medication is given in a hospital or clinic. It will not be stored at home. NOTE: This sheet is a summary. It may not cover all possible information. If you have questions about this medicine, talk to your doctor, pharmacist, or health care provider.  2024 Elsevier/Gold Standard (2023-05-21 00:00:00)

## 2023-08-24 NOTE — Telephone Encounter (Signed)
PT PICKED UP PATIENCE ASSISTANCE OZEMPIC 4 PENS - SIGN BLUE FORMS LEFT IN [place] Corn, LPN BOX

## 2023-08-24 NOTE — Telephone Encounter (Signed)
Patient assistance received for Ozempic 1 mg 4 boxes  NDC [id number] Lot # [id number] EXP 09/26/2025   Called patient and informed of received patient assistance and patient was informed.

## 2023-08-25 ENCOUNTER — Other Ambulatory Visit: Payer: Self-pay

## 2023-08-25 NOTE — Telephone Encounter (Signed)
Called patient and made her aware that ozempic 0.5 mg was in office for pick up, Patient stated that she pick up 1 mg from office yesterday and she does not need the 0.5 mg.  Recommend patient call PA company made them aware they don't have to send the 0.5 mg anymore.

## 2023-08-27 ENCOUNTER — Other Ambulatory Visit: Payer: Self-pay | Admitting: Family Medicine

## 2023-09-15 ENCOUNTER — Ambulatory Visit: Payer: PPO | Attending: Cardiology | Admitting: Cardiology

## 2023-09-15 ENCOUNTER — Other Ambulatory Visit: Payer: Self-pay

## 2023-09-15 ENCOUNTER — Encounter: Payer: Self-pay | Admitting: Cardiology

## 2023-09-15 DIAGNOSIS — I152 Hypertension secondary to endocrine disorders: Secondary | ICD-10-CM

## 2023-09-15 DIAGNOSIS — I480 Paroxysmal atrial fibrillation: Secondary | ICD-10-CM | POA: Diagnosis not present

## 2023-09-15 DIAGNOSIS — E1159 Type 2 diabetes mellitus with other circulatory complications: Secondary | ICD-10-CM

## 2023-09-15 DIAGNOSIS — E782 Mixed hyperlipidemia: Secondary | ICD-10-CM | POA: Diagnosis not present

## 2023-09-15 DIAGNOSIS — Z7984 Long term (current) use of oral hypoglycemic drugs: Secondary | ICD-10-CM

## 2023-09-15 LAB — HM DIABETES EYE EXAM

## 2023-09-15 NOTE — Progress Notes (Signed)
Cardiology Office Note:    Date:  09/15/2023   ID:  Rachel Hensley, DOB 1956-05-18, MRN [id number]  PCP:  Blane Ohara, MD  Cardiologist:  Gypsy Balsam, MD    Referring MD: Blane Ohara, MD   Chief Complaint  Patient presents with   Follow-up    History of Present Illness:    Rachel Hensley is a 67 y.o. female with past medical history significant for paroxysmal atrial fibrillation recurrent episodes we continue rhythm control strategy, she has been put on flecainide 50 mg twice daily doing well.  In preparation for antiarrhythmic therapy stress test being done which was negative, echocardiogram showed preserved ejection fraction.  Additional problem includes diabetes dyslipidemia.  Comes today to months for follow-up.  Overall doing well.  She denies have any chest pain tightness squeezing pressure burning chest no palpitations dizziness swelling of lower extremities.  She described only situation when sometimes she gets up very quickly she will get a bruising  Past Medical History:  Diagnosis Date   A-fib [facility] Hens[facility])    Angioedema 04/05/2018   GERD (gastroesophageal reflux disease)    History of migraine    Hyperlipidemia    Hypertension    Hypoglycemia due to type 2 diabetes mellitus (HCC) 08/21/2020   Mixed hyperlipidemia    Nontoxic multinodular goiter    Other vitamin B12 deficiency anemias    Primary insomnia 05/20/2020   Solitary pulmonary nodule    Type 2 diabetes mellitus with other specified complication [facility])     Past Surgical History:  Procedure Laterality Date   ABDOMINAL HYSTERECTOMY  06/1993   partial; still has both ovaries   HYSTEROTOMY     partial    Current Medications: Current Meds  Medication Sig   apixaban (ELIQUIS) 5 MG TABS tablet Take 1 tablet (5 mg total) by mouth 2 (two) times daily.   atorvastatin (LIPITOR) 20 MG tablet Take 1 tablet (20 mg total) by mouth daily.   cetirizine (ZYRTEC) 10 MG tablet Take 10 mg by mouth daily.    diltiazem (CARDIZEM) 30 MG tablet Take 30 mg by mouth every 6 (six) hours as needed (HR).   Empagliflozin-metFORMIN HCl ER (SYNJARDY XR) 12.04-999 MG TB24 Take 1 tablet by mouth in the morning and at bedtime.   EPINEPHrine 0.3 mg/0.3 mL IJ SOAJ injection Inject 0.3 mg into the muscle once.   Finerenone (KERENDIA) 10 MG TABS Take 1 tablet by mouth daily.   flecainide (TAMBOCOR) 50 MG tablet Take 1 tablet (50 mg total) by mouth 2 (two) times daily.   furosemide (LASIX) 20 MG tablet Take 1 tablet (20 mg total) by mouth daily.   glimepiride (AMARYL) 4 MG tablet Take 1 tablet by mouth twice daily   icosapent Ethyl (VASCEPA) 1 g capsule Take 2 capsules (2 g total) by mouth 2 (two) times daily.   Iron, Ferrous Sulfate, 325 (65 Fe) MG TABS Take 325 mg by mouth daily.   magnesium oxide (MAG-OX) 400 (240 Mg) MG tablet Take 800 mg by mouth 2 (two) times daily.   metoprolol succinate (TOPROL-XL) 50 MG 24 hr tablet Take 1 tablet (50 mg total) by mouth daily. Take with or immediately following a meal.   omeprazole (PRILOSEC) 20 MG capsule Take 1 capsule (20 mg total) by mouth 2 (two) times daily.   ondansetron (ZOFRAN ODT) 4 MG disintegrating tablet Take 1 tablet (4 mg total) by mouth every 8 (eight) hours as needed for nausea or vomiting.   promethazine (PHENERGAN) 25 MG  tablet Take 1 tablet (25 mg total) by mouth every 8 (eight) hours as needed for nausea or vomiting.   Semaglutide, 1 MG/DOSE, 4 MG/3ML SOPN Inject 1 mg as directed once a week.     Allergies:   Actos [pioglitazone], Fenofibrate, Lisinopril, and Prednisone   Social History   Socioeconomic History   Marital status: Widowed    Spouse name: Not on file   Number of children: 3   Years of education: Not on file   Highest education level: GED or equivalent  Occupational History   Occupation: RETIRED SEWER  Tobacco Use   Smoking status: Never   Smokeless tobacco: Never  Vaping Use   Vaping status: Never Used  Substance and Sexual  Activity   Alcohol use: Never   Drug use: Never   Sexual activity: Not Currently  Other Topics Concern   Not on file  Social History Narrative   Not on file   Social Determinants of Health   Financial Resource Strain: Low Risk  (07/23/2023)   Overall Financial Resource Strain (CARDIA)    Difficulty of Paying Living Expenses: Not very hard  Recent Concern: Financial Resource Strain - Medium Risk (06/02/2023)   Overall Financial Resource Strain (CARDIA)    Difficulty of Paying Living Expenses: Somewhat hard  Food Insecurity: No Food Insecurity (08/16/2023)   Hunger Vital Sign    Worried About Running Out of Food in the Last Year: Never true    Ran Out of Food in the Last Year: Never true  Transportation Needs: No Transportation Needs (08/16/2023)   PRAPARE - Administrator, Civil Service (Medical): No    Lack of Transportation (Non-Medical): No  Physical Activity: Inactive (07/23/2023)   Exercise Vital Sign    Days of Exercise per Week: 0 days    Minutes of Exercise per Session: 30 min  Stress: No Stress Concern Present (07/23/2023)   [employer] of Occupational Health - Occupational Stress Questionnaire    Feeling of Stress : Not at all  Social Connections: Moderately Isolated (07/23/2023)   Social Connection and Isolation Panel [NHANES]    Frequency of Communication with Friends and Family: More than three times a week    Frequency of Social Gatherings with Friends and Family: Three times a week    Attends Religious Services: More than 4 times per year    Active Member of Clubs or Organizations: No    Attends Banker Meetings: Never    Marital Status: Widowed     Family History: The patient's family history includes Arrhythmia in her son; Asthma in her son; Atopy in her grandchild; Atrial fibrillation in her sister and son; Breast cancer in her paternal aunt; Colon cancer in her father and paternal uncle; Coronary artery disease in her brother and  mother; Diabetes in her brother, brother, and sister; Diabetes type II in her brother; Eczema in her grandchild; Glaucoma in her mother; Kidney cancer in her brother; Lymphoma in her maternal aunt; Parkinsonism in her son; Prostate cancer in her brother; Throat cancer in her cousin. There is no history of Allergic rhinitis, Angioedema, Immunodeficiency, or Urticaria. ROS:   Please see the history of present illness.    All 14 point review of systems negative except as described per history of present illness  EKGs/Labs/Other Studies Reviewed:         Recent Labs: 12/07/2022: NT-Pro BNP 285 01/19/2023: TSH 0.777 07/29/2023: ALT 22; BUN 24; Creatinine, Ser 0.90; Hemoglobin 12.6; Magnesium 1.8; Platelets  363; Potassium 4.9; Sodium 140  Recent Lipid Panel    Component Value Date/Time   CHOL 139 07/29/2023 0916   TRIG 262 (H) 07/29/2023 0916   HDL 41 07/29/2023 0916   CHOLHDL 3.4 07/29/2023 0916   LDLCALC 57 07/29/2023 0916    Physical Exam:    VS:  LMP  (LMP Unknown)     Wt Readings from Last 3 Encounters:  08/24/23 151 lb 0.6 oz (68.5 kg)  08/19/23 152 lb (68.9 kg)  08/16/23 151 lb 11.2 oz (68.8 kg)     GEN:  Well nourished, well developed in no acute distress HEENT: Normal NECK: No JVD; No carotid bruits LYMPHATICS: No lymphadenopathy CARDIAC: RRR, no murmurs, no rubs, no gallops RESPIRATORY:  Clear to auscultation without rales, wheezing or rhonchi  ABDOMEN: Soft, non-tender, non-distended MUSCULOSKELETAL:  No edema; No deformity  SKIN: Warm and dry LOWER EXTREMITIES: no swelling NEUROLOGIC:  Alert and oriented x 3 PSYCHIATRIC:  Normal affect   ASSESSMENT:    1. Paroxysmal atrial fibrillation (HCC)   2. Hypertension associated with diabetes (HCC)   3. Hypomagnesemia   4. Mixed hyperlipidemia    PLAN:    In order of problems listed above:  Paroxysmal atrial fibrillation seems to well-controlled with flecainide we will continue EKG did not show any acute changes  QRS complex duration is still normal. Essential hypertension blood pressure well-controlled actually slightly on the lower side asked her to discontinue furosemide. Hypomagnesemia she taking magnesium on a regular basis magnesium will be checked. Mixed dyslipidemia I did review K PN which show me her LDL 57 HDL 41 this from August 1 of this year continue present management   Medication Adjustments/Labs and Tests Ordered: Current medicines are reviewed at length with the patient today.  Concerns regarding medicines are outlined above.  Orders Placed This Encounter  Procedures   EKG 12-Lead   Medication changes: No orders of the defined types were placed in this encounter.   Signed, Georgeanna Lea, MD, [facility] 09/15/2023 1:20 PM     [facility]

## 2023-09-15 NOTE — Patient Instructions (Addendum)
Medication Instructions:   STOP: Lasix   Lab Work: None Ordered If you have labs (blood work) drawn today and your tests are completely normal, you will receive your results only by: MyChart Message (if you have MyChart) OR A paper copy in the mail If you have any lab test that is abnormal or we need to change your treatment, we will call you to review the results.   Testing/Procedures: None Ordered   Follow-Up: At [facility], you and your health needs are our priority.  As part of our continuing mission to provide you with exceptional heart care, we have created designated Provider Care Teams.  These Care Teams include your primary Cardiologist (physician) and Advanced Practice Providers (APPs -  Physician Assistants and Nurse Practitioners) who all work together to provide you with the care you need, when you need it.  We recommend signing up for the patient portal called "MyChart".  Sign up information is provided on this After Visit Summary.  MyChart is used to connect with patients for Virtual Visits (Telemedicine).  Patients are able to view lab/test results, encounter notes, upcoming appointments, etc.  Non-urgent messages can be sent to your provider as well.   To learn more about what you can do with MyChart, go to [email or web address].    Your next appointment:   6 month(s)  The format for your next appointment:   In Person  Provider:   Gypsy Balsam, MD    Other Instructions NA

## 2023-09-20 ENCOUNTER — Encounter: Payer: Self-pay | Admitting: Family Medicine

## 2023-09-23 IMAGING — MG MM DIGITAL SCREENING BILAT W/ TOMO AND CAD
8 series · 9 of 24 positions shown · non-contrast
Comparison: Previous exam(s).

CLINICAL DATA: Screening.

EXAM:
DIGITAL SCREENING BILATERAL MAMMOGRAM WITH TOMOSYNTHESIS AND CAD
TECHNIQUE: Bilateral screening digital craniocaudal and mediolateral oblique
mammograms were obtained. Bilateral screening digital breast
tomosynthesis was performed. The images were evaluated with
computer-aided detection.

[L MLO synth-2D]
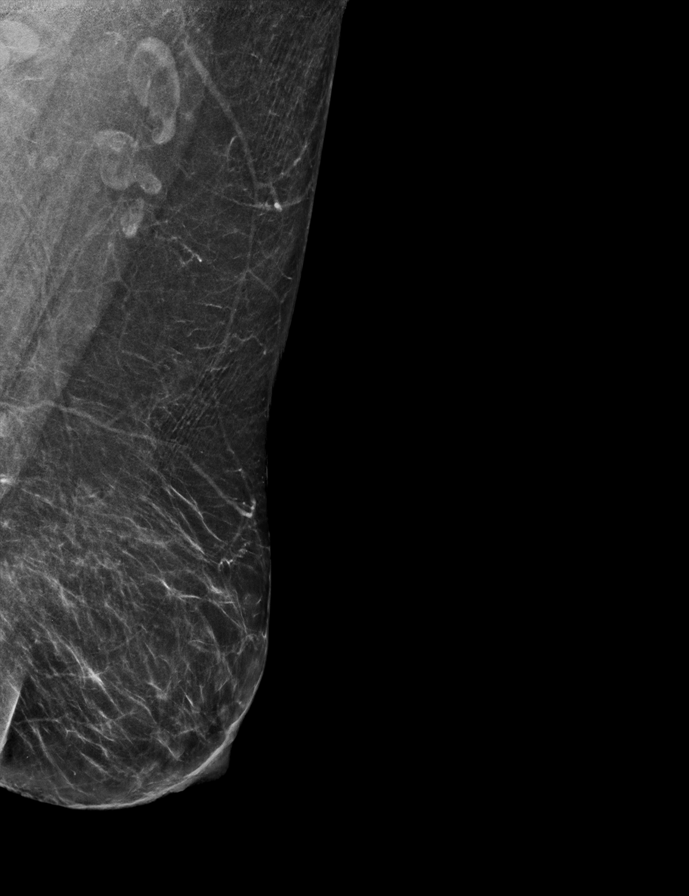

[R MLO synth-2D]
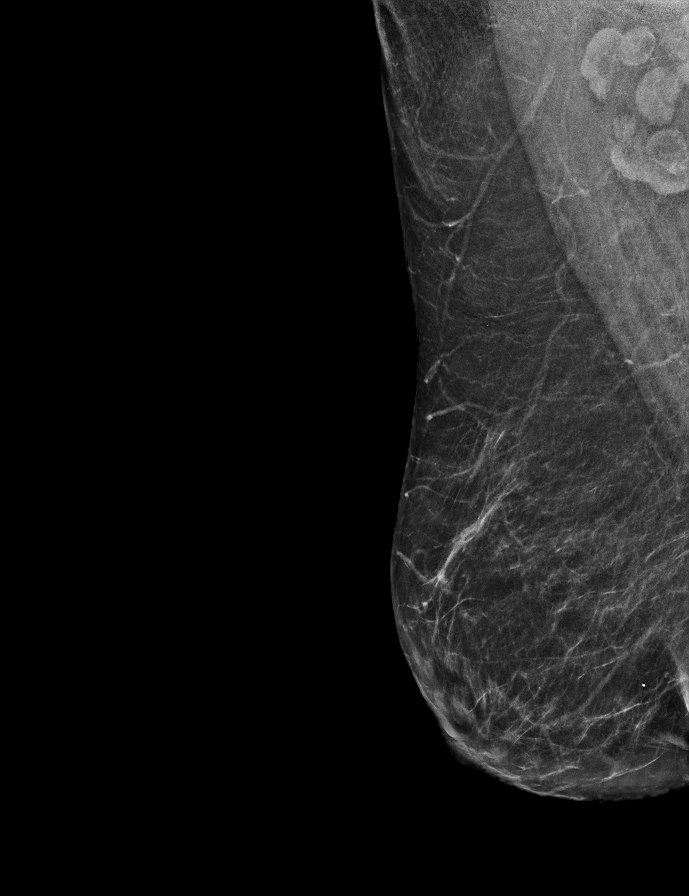

[R CC synth-2D]
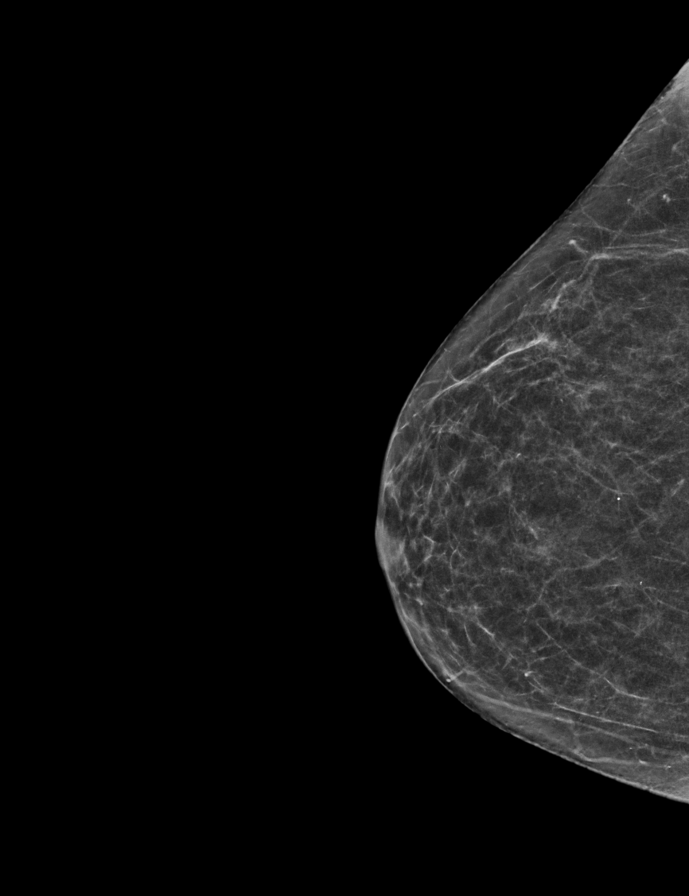

[L CC synth-2D]
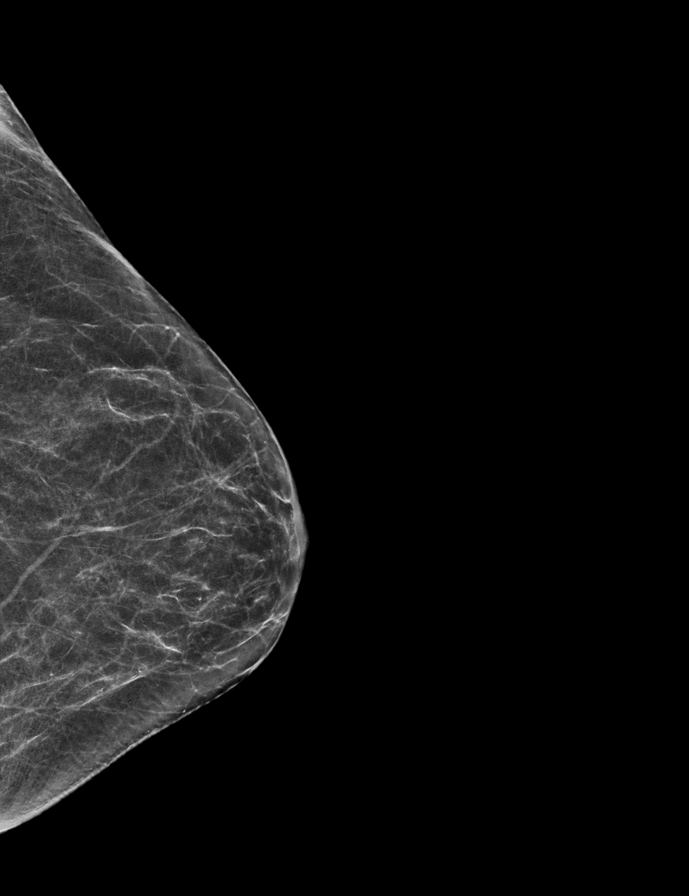

[L MLO tomo · 2 of 59 frames shown]
[frame 20/59]
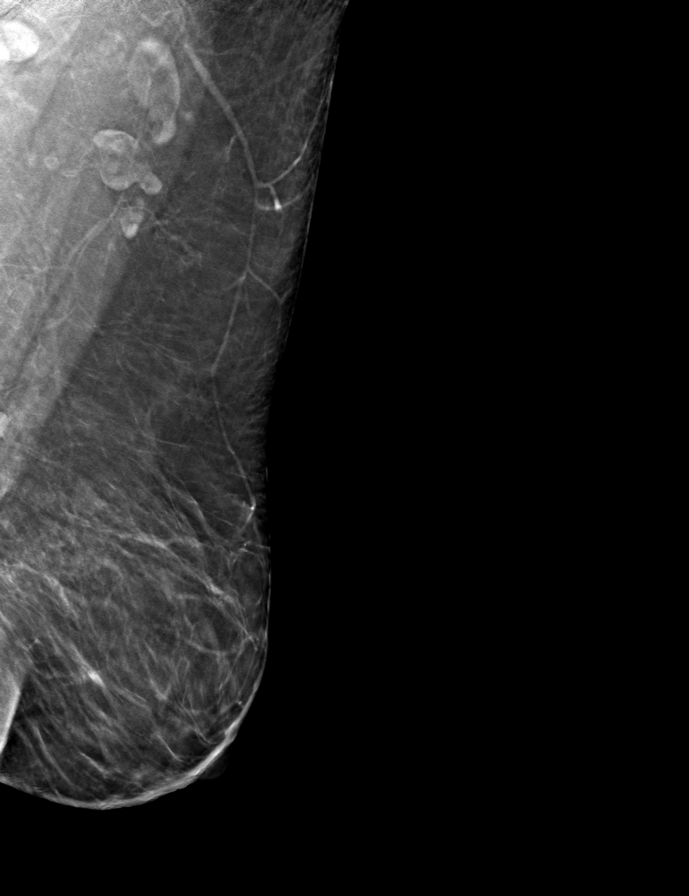
[frame 30/59]
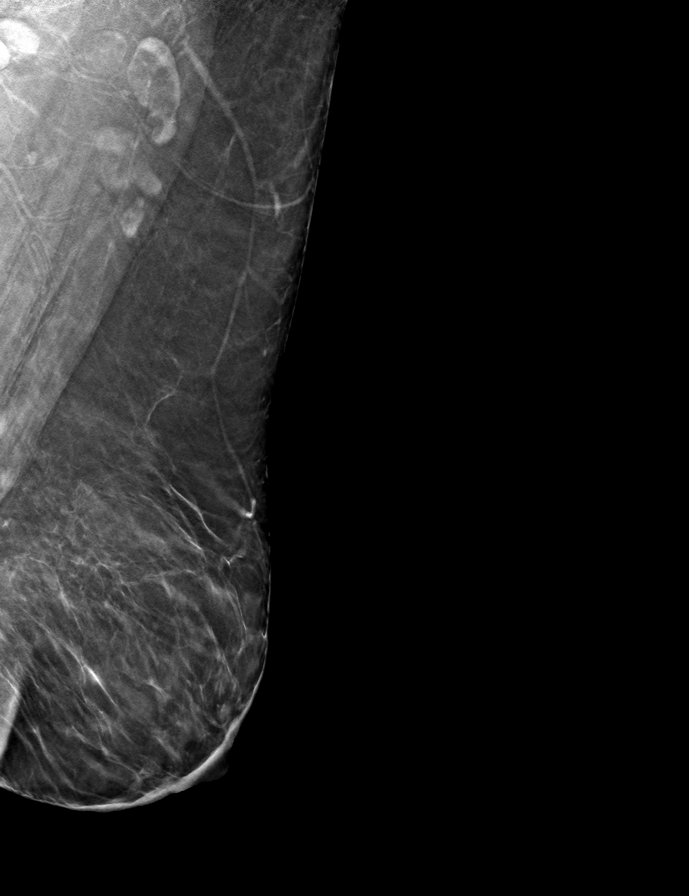

[R CC tomo · tomo slice 25/48.0]
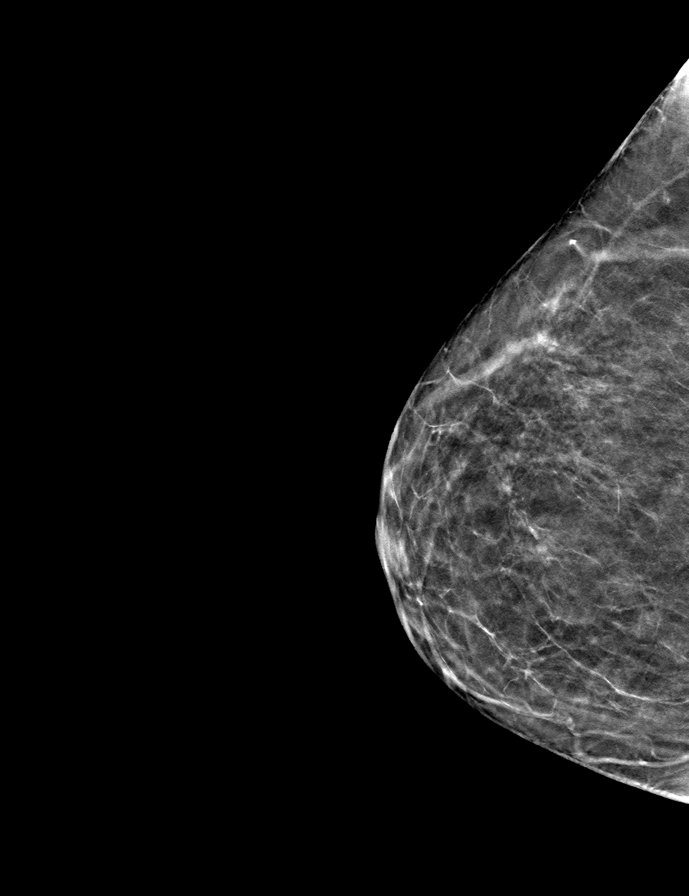

[L CC tomo · tomo slice 25/50.0]
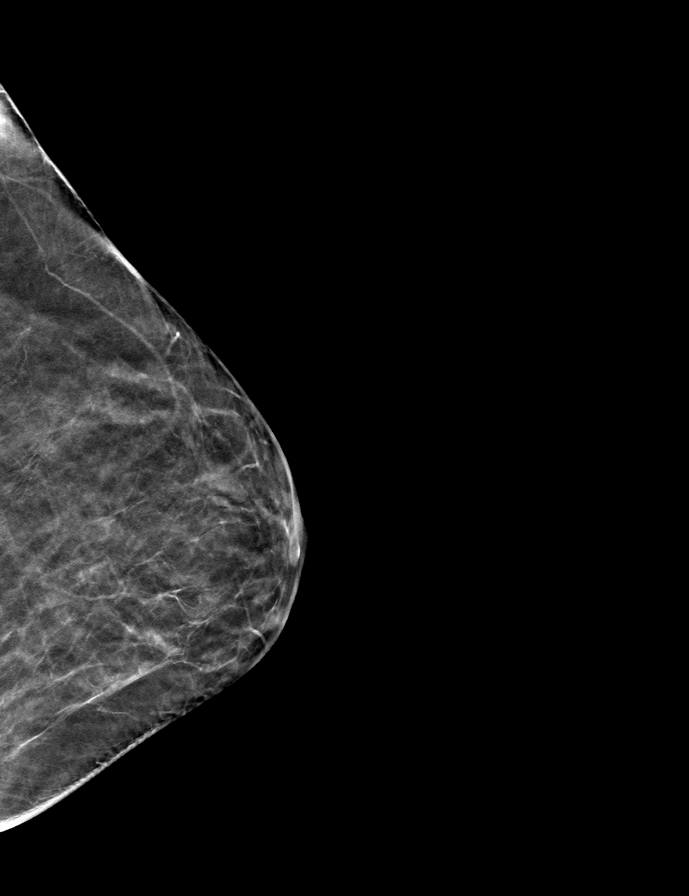

[R MLO tomo · tomo slice 29/56.0]
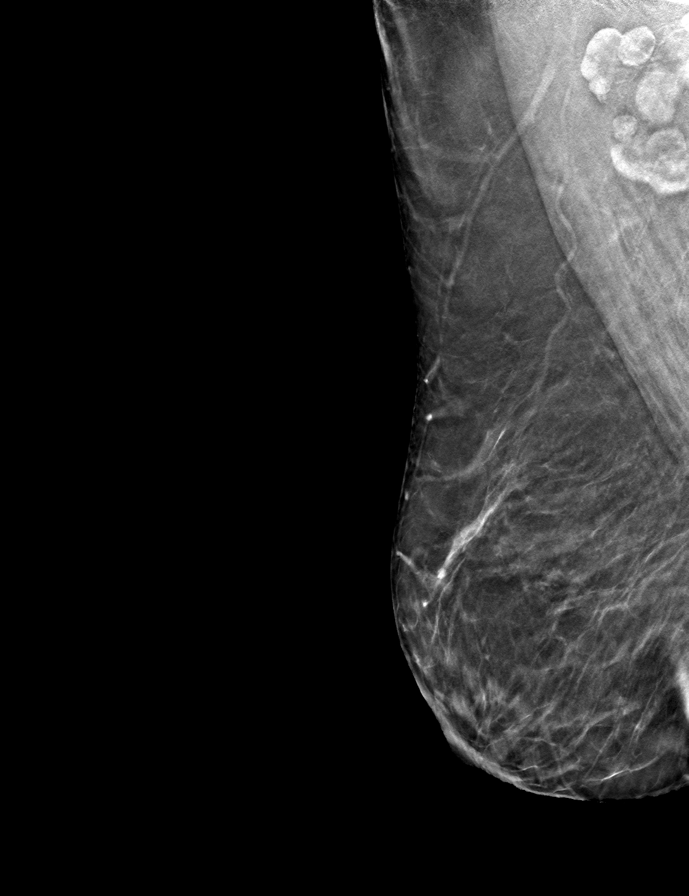

[9 of 24 positions shown; findings below may reference images not displayed]

ACR Breast Density Category b: There are scattered areas of
fibroglandular density.
FINDINGS: There are no findings suspicious for malignancy.
IMPRESSION: No mammographic evidence of malignancy. A result letter of this
screening mammogram will be mailed directly to the patient.

RECOMMENDATION:
Screening mammogram in one year. (Code:51-O-LD2)

BI-RADS CATEGORY  1: Negative.

## 2023-10-06 ENCOUNTER — Other Ambulatory Visit: Payer: PPO

## 2023-10-06 ENCOUNTER — Other Ambulatory Visit: Payer: Self-pay

## 2023-10-06 NOTE — Patient Instructions (Signed)
Visit Information  Thank you for taking time to visit with me today. Please don't hesitate to contact me if I can be of assistance to you before our next scheduled telephone appointment.  Following are the goals we discussed today:   Goals Addressed             This Visit's Progress    RNCM Care Management (depression and grief) EXPECTED OUTCOME: MONITOR, SELF-MANAGE AND REDUCE SYMPTOMS OF Depression and Grief       Current Barriers:  Care Coordination needs related to [facility]rief[facility] support and resources in a patient with depression and grief Chronic Disease Management support and education needs related to effective management of depression and grief 10-06-2023- states that her brother is in Hospice care currently, she lost an Uncle on Nov 01, 2023 2023-10-18, death anniversary of husband next week (8 years)  Planned Interventions: Evaluation of current treatment plan related to depression and grief and patient's adherence to plan as established by provider. The patient feels she is doing okay right now with her depression and grief.   The patient has a good support system. Currently her brother is in Hospice care, earlier in the week her uncle passed away, next week is the death anniversary of her late husband (8 years). Reflective listening and support.  Advised patient to call the office for changes in mood, anxiety, depression, or acute changes in mental health Provided education to patient re: about the GriefShare and the availability of grief support due to the loss of her husband, mom, brother, and other family members over the last few years. Reminder given of the [facility] program and the resources available. Reviewed scheduled/upcoming provider appointments including 12-01-2023 at 0820 am Discussed plans with patient for ongoing care management follow up and provided patient with direct contact information for care management team Advised patient to discuss changes in her mental health,  depression and grief with provider. Reflective listening and support given Screening for signs and symptoms of depression related to chronic disease state  Assessed social determinant of health barriers The patient denies any new issues related to UTI, or changes in her urinary function. Knows to call the office for changes.   Symptom Management: Attend all scheduled provider appointments Attend church or other social activities Work with the social worker to address care coordination needs and will continue to work with the clinical team to address health care and disease management related needs call the Suicide and Crisis Lifeline: 988 call the [place] National Suicide Prevention Lifeline: 223-200-8634 or TTY: 6318682499 TTY 463-012-2196) to talk to a trained counselor call 1-800-273-TALK (toll free, 24 hour hotline) if experiencing a Mental Health or Behavioral Health Crisis   Follow Up Plan: Telephone follow up appointment with care management team member scheduled for: 12-09-2023 at 0900 am       RNCM Care Management Expected Outcome:  Monitor, Self-Manage and Reduce Symptoms of Afib       Current Barriers:  Knowledge Deficits related to AFIB and recent hospitalization for new onset of AFIB Care Coordination needs related to resources and management of AFIB in a patient with new onset of AFIB with hospitalization for AFIB with [facility] Chronic Disease Management support and education needs related to effective management of AFIB  Planned Interventions: Provider order and care plan reviewed. The patient feels that the current plan for effective management of her Afib is working well for her. She states that her AFIB is stable. Saw the cardiologist in September, her lasix was discontinued. Denies  any concerns with her AFIB or heart health. Counseled on increased risk of stroke due to Afib and benefits of anticoagulation for stroke prevention. The patient is aware of the risk of Stroke due to  AFIB, knows what Sx and Sx to look for           Reviewed importance of adherence to anticoagulant exactly as prescribed. Is taking medications as directed and is also taking Magnesium 1600 mg daily. The cardiologist does not like this high dose but is trying to keep her levels up of magnesium. Is being followed closely. Education and support given. Takes her medications as directed. Advised patient to discuss changes in her heart rate, changes in her sx and sx, questions and concerns with provider Counseled on bleeding risk associated with AFIB and importance of self-monitoring for signs/symptoms of bleeding Counseled on avoidance of NSAIDs due to increased bleeding risk with anticoagulants Counseled on importance of regular laboratory monitoring as prescribed. Had repeat labs last week for check on Magnesium levels Counseled on seeking medical attention after a head injury or if there is blood in the urine/stool Afib action plan reviewed. The patient is taking Eliquis 5 mg QD. She also has Cardizem to take prn. She has parameters to take the Cardizem. She is also taking Magnesium that is helping her as her Magnesium was low.  Screening for signs and symptoms of depression related to chronic disease state Assessed social determinant of health barriers  Symptom Management: Take medications as prescribed   Attend all scheduled provider appointments Call provider office for new concerns or questions  call the Suicide and Crisis Lifeline: 988 call the [place] National Suicide Prevention Lifeline: 340-799-3836 or TTY: 385-494-8439 TTY (901) 478-5247) to talk to a trained counselor call 1-800-273-TALK (toll free, 24 hour hotline) if experiencing a Mental Health or Behavioral Health Crisis  - check pulse (heart) rate before taking medicine - make a plan to eat healthy - keep all lab appointments - take medicine as prescribed  Follow Up Plan: Telephone follow up appointment with care management team  member scheduled for: 12-09-2023 at 0900 am       RNCM Care Management Expected Outcome:  Monitor, Self-Manage, and Reduce Symptoms of Hypertension       Current Barriers:  Chronic Disease Management support and education needs related to effective management of DM Financial Constraints.   BP Readings from Last 3 Encounters:  08/24/23 120/62  08/19/23 (!) 155/56  08/16/23 (!) 141/70      Planned Interventions: Evaluation of current treatment plan related to hypertension self management and patient's adherence to plan as established by provider. The patient is compliant with the plan of care. Denies any new concerns with HTN. Blood pressures have been fluctuating some but denies any acute findings today.  The patient is doing well. Saw cardiologist recently.  Provided education to patient re: stroke prevention, s/s of heart attack and stroke; Reviewed prescribed diet heart healthy/ADA diet. The patient is eating healthy. Sometimes eats late due to no appetite from the medications she is taking for her DM. Review of healthy snacks. The patient has been having issues with keeping her magnesium level up. Magnesium is causing her to have diarrhea but that is better since she is taking her iron every day. Education on watching for dietary changes and making sure she is following a heart healthy/ADA diet. Reviewed medications with patient and discussed importance of compliance. The patient is compliant with medications. Review of pharm D changes and the support  available if needed. The patient will talk to the pharm D tomorrow at 10 am for reapplying for medication assistance.  Discussed plans with patient for ongoing care management follow up and provided patient with direct contact information for care management team; Advised patient, providing education and rationale, to monitor blood pressure daily and record, calling PCP for findings outside established parameters;  Advised patient to discuss  changes in blood pressure or other heart concerns  with provider; Provided education on prescribed diet heart healthy/ADA diet. Education on foods high in Magnesium: fish, greens, nuts, vegetables, beans ;  Discussed complications of poorly controlled blood pressure such as heart disease, stroke, circulatory complications, vision complications, kidney impairment, sexual dysfunction;  Next pcp appointment on 12-01-2023.  Symptom Management: Take medications as prescribed   Attend all scheduled provider appointments Call pharmacy for medication refills 3-7 days in advance of running out of medications Call provider office for new concerns or questions  call the Suicide and Crisis Lifeline: 988 call the [place] National Suicide Prevention Lifeline: 562 796 0970 or TTY: (737)031-3921 TTY 6261135492) to talk to a trained counselor call 1-800-273-TALK (toll free, 24 hour hotline) if experiencing a Mental Health or Behavioral Health Crisis  check blood pressure weekly learn about high blood pressure call doctor for signs and symptoms of high blood pressure develop an action plan for high blood pressure keep all doctor appointments take medications for blood pressure exactly as prescribed report new symptoms to your doctor  Follow Up Plan: Telephone follow up appointment with care management team member scheduled for: 12-09-2023 at 0900 am       RNCM: Care Management Expected Outcome:  Monitor, Self-Manage and Reduce Symptoms of Diabetes       Current Barriers:  Care Coordination needs related to loss of job and inability to pay for medications in a patient with DM Chronic Disease Management support and education needs related to effective management of DM Financial Constraints- working with pharm D for PAP assistance Difficulty obtaining medications  Lab Results  Component Value Date   HGBA1C 6.8 (H) 07/29/2023    Planned Interventions: Provided education to patient about basic DM  disease process. Patients A1C is below goal at most recent visit. The patient sees the pcp again in December will have new blood work.  Reviewed medications with patient and discussed importance of medication adherence. The patient was approved for Ozempic and is on 0.5mg . She is thankful that she gets financial assistance for Ozempic through 2024. The pcp has increased the dose of her Ozempic to 1 mg. She has also been approved for [facility] and is taking this now. Is seeing better readings. The patient has an upcoming appointment with the pharm D tomorrow for submitting paperwork for assistance on medications for 2025. Reviewed prescribed diet with patient heart healthy/ADA diet. Review and education. The patient is eating a snack before bed sometimes. She states that she is not hungry and sometimes she forgets to eat until it is late. Review of ways to have a simple snack so no issues with lows in the mornings.  Counseled on importance of regular laboratory monitoring as prescribed. Has regular lab work ;        Discussed plans with patient for ongoing care management follow up and provided patient with direct contact information for care management team;      Provided patient with written educational materials related to hypo and hyperglycemia and importance of correct treatment. The highest she has seen lately is 152 recently, and  the lowest was 60.    Reviewed scheduled/upcoming provider appointments including: 12-01-2023 with pcp Advised patient, providing education and rationale, to check cbg when you have symptoms of low or high blood sugar and as directed   and record. The patient checks her blood sugars. The patient states her readings have been around 70 to 100 fasting. The lowest she has seen is 60 and she knows how to effectively treat. Review of goal of fasting <130 and post prandial of <180     call provider for findings outside established parameters;       Referral made to pharmacy team for  assistance with financial difficulties of being able to afford medications for effective management of DM. Has been approved for Ozempic through 2024 and is thankful for this. Has an appointment with pharm D on 10-07-2023. Referral made to social work team for assistance with financial barriers, grief and depression. Has worked with the LCSW before, knows to let the RNCM know if she needs additional SW support. ;      Review of patient status, including review of consultants reports, relevant laboratory and other test results, and medications completed;       Advised patient to discuss changes in her DM health and well being with provider;      Screening for signs and symptoms of depression related to chronic disease state;        Assessed social determinant of health barriers;       Patient to schedule an appointment with the eye doctor after the first of the year.  Symptom Management: Take medications as prescribed   Attend all scheduled provider appointments Call pharmacy for medication refills 3-7 days in advance of running out of medications Call provider office for new concerns or questions  Work with the social worker to address care coordination needs and will continue to work with the clinical team to address health care and disease management related needs call the Suicide and Crisis Lifeline: 988 call the [place] National Suicide Prevention Lifeline: (947) 811-2299 or TTY: (236) 401-4400 TTY 782 523 7478) to talk to a trained counselor call 1-800-273-TALK (toll free, 24 hour hotline) if experiencing a Mental Health or Behavioral Health Crisis  schedule appointment with eye doctor check feet daily for cuts, sores or redness trim toenails straight across fill half of plate with vegetables manage portion size prepare main meal at home 3 to 5 days each week wash and dry feet carefully every day wear comfortable, cotton socks wear comfortable, well-fitting shoes  Follow Up Plan:  Telephone follow up appointment with care management team member scheduled for: 12-09-2023 at 0900 am           Our next appointment is by telephone on 12-09-2023 at 0900 am  Please call the care guide team at [phone] if you need to cancel or reschedule your appointment.   If you are experiencing a Mental Health or Behavioral Health Crisis or need someone to talk to, please call the Suicide and Crisis Lifeline: 988 call the [place] National Suicide Prevention Lifeline: (705)527-8533 or TTY: 845 791 0267 TTY 269-726-7234) to talk to a trained counselor call 1-800-273-TALK (toll free, 24 hour hotline) go to [facility] [address] [place] [phone])   Patient verbalizes understanding of instructions and care plan provided today and agrees to view in MyChart. Active MyChart status and patient understanding of how to access instructions and care plan via MyChart confirmed with patient.     Telephone follow up appointment  with care management team member scheduled for: 12-09-2023 at 0900 am  Alto Denver RN, MSN, CCM RN Care Manager  [facility]  Direct Number: [phone]   Preventing Hypoglycemia Hypoglycemia is when the amount of sugar, or glucose, in your blood is too low. Low blood sugar can happen if you have diabetes or if you don't have diabetes. It may be an emergency. Work with your health care provider to make and change your meal plan as needed. This can help prevent low blood sugar. What can increase my risk? You may be more likely to get low blood sugar if: You take insulin or other diabetes medicines. You skip or delay a meal or snack. You get sick. How can low blood sugar affect me? Mild symptoms Mild cases may not cause symptoms. If you do have symptoms, they may include: Hunger or feeling like you may vomit. Sweating and feeling cold to the touch. Feeling dizzy or light-headed. Being  sleepy or having trouble sleeping. A fast heart rate. A headache. Blurry eyesight. Mood changes. These include feeling worried, nervous, or easily annoyed. Tingling or numbness around your mouth, lips, or tongue. If a mild case of low blood sugar isn't treated, it can become moderate or severe. Moderate symptoms If you have a moderate case, you may: Feel confused. Have changes in the way you act or move. Feel weak. Have an uneven heartbeat. Severe symptoms Having very low blood sugar is an emergency. It can cause: Fainting. Seizures. A coma. Death. What nutrition changes can I make? Work with your provider or an expert in healthy eating called a dietitian to make a meal plan. Eat meals at set times. Have snacks between meals, as told by your provider. Donot skip or delay meals or snacks. What other actions can I take to prevent low blood sugar?  Work closely with your provider to manage your blood sugar. Make sure you know: What your blood sugar should be. How and when to check your blood sugar. The symptoms of low blood sugar. Be sure to eat food when you drink alcohol. When you're sick, check your blood sugar more often. Make a sick day plan in advance with your provider. Follow this plan when you can't eat or drink like normal. Always check your blood sugar before, during, and after exercise. How is this treated? Treating low blood sugar If you have low blood sugar, eat or drink something with sugar in it right away. The food or drink should have 15 grams of a fast-acting carbohydrate (carb). Options include: 4 oz (120 mL) of fruit juice. 4 oz (120 mL) of soda (not diet soda). A few pieces of hard candy. Check food labels to see how many pieces to eat. 1 Tbsp (15 mL) of sugar or honey. 4 glucose tablets. 1 tube of glucose gel. Treating low blood sugar if you have diabetes If you're alert and can swallow safely, follow the 15:15 rule: Take 15 grams of a fast-acting carb.  Talk with your provider about how much carb you should take. Check your blood sugar 15 minutes after you take the carb. If your blood sugar is still at or below 70 mg/dL (3.9 mmol/L), take 15 grams of a carb again. If your blood sugar doesn't go above 70 mg/dL (3.9 mmol/L) after 3 tries, get help right away. After your blood sugar goes back to normal, eat a meal or a snack within 1 hour. Treating very low blood sugar If your  blood sugar is less than 54 mg/dL (3 mmol/L), it's an emergency. Get help right away. If you can't eat or drink, you will need to be given glucagon. A family member or friend should learn how to check your blood sugar and give you glucagon. Ask your provider if you should keep a glucagon kit at home. You may also need to be treated in a hospital. Where to find more information American Diabetes Association (ADA): diabetes.[employer] of Diabetes and Digestive and Kidney Diseases (NIDDK): [employer]si Association of Diabetes Care & Education Specialists: diabeteseducator.org Contact a health care provider if: You have diabetes and are having trouble keeping your blood sugar in the right range. You have low blood sugar often. Get help right away if: You can't get your blood sugar above 70 mg/dL (3.9 mmol/L) after 3 tries. Your blood sugar is below 54 mg/dL (3 mmol/L). You faint. You have a seizure. These symptoms may be an emergency. Call 911 right away. Do not wait to see if the symptoms will go away. Do not drive yourself to the hospital. This information is not intended to replace advice given to you by your health care provider. Make sure you discuss any questions you have with your health care provider. Document Revised: 03/04/2023 Document Reviewed: 03/04/2023 Elsevier Patient Education  2024 Elsevier Inc. Diabetes Mellitus and Nutrition, Adult When you have diabetes, or diabetes mellitus, it is very important to have healthy eating habits because  your blood sugar (glucose) levels are greatly affected by what you eat and drink. Eating healthy foods in the right amounts, at about the same times every day, can help you: Manage your blood glucose. Lower your risk of heart disease. Improve your blood pressure. Reach or maintain a healthy weight. What can affect my meal plan? Every person with diabetes is different, and each person has different needs for a meal plan. Your health care provider may recommend that you work with a dietitian to make a meal plan that is best for you. Your meal plan may vary depending on factors such as: The calories you need. The medicines you take. Your weight. Your blood glucose, blood pressure, and cholesterol levels. Your activity level. Other health conditions you have, such as heart or kidney disease. How do carbohydrates affect me? Carbohydrates, also called carbs, affect your blood glucose level more than any other type of food. Eating carbs raises the amount of glucose in your blood. It is important to know how many carbs you can safely have in each meal. This is different for every person. Your dietitian can help you calculate how many carbs you should have at each meal and for each snack. How does alcohol affect me? Alcohol can cause a decrease in blood glucose (hypoglycemia), especially if you use insulin or take certain diabetes medicines by mouth. Hypoglycemia can be a life-threatening condition. Symptoms of hypoglycemia, such as sleepiness, dizziness, and confusion, are similar to symptoms of having too much alcohol. Do not drink alcohol if: Your health care provider tells you not to drink. You are pregnant, may be pregnant, or are planning to become pregnant. If you drink alcohol: Limit how much you have to: 0-1 drink a day for women. 0-2 drinks a day for men. Know how much alcohol is in your drink. In the [place] one drink equals one 12 oz bottle of beer (355 mL), one 5 oz glass of wine (148 mL),  or one 1 oz glass of hard liquor (44 mL). Keep yourself  hydrated with water, diet soda, or unsweetened iced tea. Keep in mind that regular soda, juice, and other mixers may contain a lot of sugar and must be counted as carbs. What are tips for following this plan?  Reading food labels Start by checking the serving size on the Nutrition Facts label of packaged foods and drinks. The number of calories and the amount of carbs, fats, and other nutrients listed on the label are based on one serving of the item. Many items contain more than one serving per package. Check the total grams (g) of carbs in one serving. Check the number of grams of saturated fats and trans fats in one serving. Choose foods that have a low amount or none of these fats. Check the number of milligrams (mg) of salt (sodium) in one serving. Most people should limit total sodium intake to less than 2,300 mg per day. Always check the nutrition information of foods labeled as "low-fat" or "nonfat." These foods may be higher in added sugar or refined carbs and should be avoided. Talk to your dietitian to identify your daily goals for nutrients listed on the label. Shopping Avoid buying canned, pre-made, or processed foods. These foods tend to be high in fat, sodium, and added sugar. Shop around the outside edge of the grocery store. This is where you will most often find fresh fruits and vegetables, bulk grains, fresh meats, and fresh dairy products. Cooking Use low-heat cooking methods, such as baking, instead of high-heat cooking methods, such as deep frying. Cook using healthy oils, such as olive, canola, or sunflower oil. Avoid cooking with butter, cream, or high-fat meats. Meal planning Eat meals and snacks regularly, preferably at the same times every day. Avoid going long periods of time without eating. Eat foods that are high in fiber, such as fresh fruits, vegetables, beans, and whole grains. Eat 4-6 oz (112-168 g) of  lean protein each day, such as lean meat, chicken, fish, eggs, or tofu. One ounce (oz) (28 g) of lean protein is equal to: 1 oz (28 g) of meat, chicken, or fish. 1 egg.  cup (62 g) of tofu. Eat some foods each day that contain healthy fats, such as avocado, nuts, seeds, and fish. What foods should I eat? Fruits Berries. Apples. Oranges. Peaches. Apricots. Plums. Grapes. Mangoes. Papayas. Pomegranates. Kiwi. Cherries. Vegetables Leafy greens, including lettuce, spinach, kale, chard, collard greens, mustard greens, and cabbage. Beets. Cauliflower. Broccoli. Carrots. Green beans. Tomatoes. Peppers. Onions. Cucumbers. Brussels sprouts. Grains Whole grains, such as whole-wheat or whole-grain bread, crackers, tortillas, cereal, and pasta. Unsweetened oatmeal. Quinoa. Brown or wild rice. Meats and other proteins Seafood. Poultry without skin. Lean cuts of poultry and beef. Tofu. Nuts. Seeds. Dairy Low-fat or fat-free dairy products such as milk, yogurt, and cheese. The items listed above may not be a complete list of foods and beverages you can eat and drink. Contact a dietitian for more information. What foods should I avoid? Fruits Fruits canned with syrup. Vegetables Canned vegetables. Frozen vegetables with butter or cream sauce. Grains Refined white flour and flour products such as bread, pasta, snack foods, and cereals. Avoid all processed foods. Meats and other proteins Fatty cuts of meat. Poultry with skin. Breaded or fried meats. Processed meat. Avoid saturated fats. Dairy Full-fat yogurt, cheese, or milk. Beverages Sweetened drinks, such as soda or iced tea. The items listed above may not be a complete list of foods and beverages you should avoid. Contact a dietitian for more information. Questions  to ask a health care provider Do I need to meet with a certified diabetes care and education specialist? Do I need to meet with a dietitian? What number can I call if I have  questions? When are the best times to check my blood glucose? Where to find more information: American Diabetes Association: diabetes.org Academy of Nutrition and Dietetics: eatright.[employer] of Diabetes and Digestive and Kidney Diseases: StageSync.si Association of Diabetes Care & Education Specialists: diabeteseducator.org Summary It is important to have healthy eating habits because your blood sugar (glucose) levels are greatly affected by what you eat and drink. It is important to use alcohol carefully. A healthy meal plan will help you manage your blood glucose and lower your risk of heart disease. Your health care provider may recommend that you work with a dietitian to make a meal plan that is best for you. This information is not intended to replace advice given to you by your health care provider. Make sure you discuss any questions you have with your health care provider. Document Revised: 07/17/2020 Document Reviewed: 07/17/2020 Elsevier Patient Education  2024 [employer].

## 2023-10-06 NOTE — Patient Outreach (Addendum)
Care Management   Visit Note  10/06/2023 Name: Rachel Hensley MRN: [id number] DOB: [birth year]  Subjective: Rachel Hensley is a 67 y.o. year old female who is a primary care patient of Cox, Kirsten, MD. The Care Management team was consulted for assistance.      Engaged with patient spoke with patient by telephone.    Goals Addressed             This Visit's Progress    RNCM Care Management (depression and grief) EXPECTED OUTCOME: MONITOR, SELF-MANAGE AND REDUCE SYMPTOMS OF Depression and Grief       Current Barriers:  Care Coordination needs related to GriefShare support and resources in a patient with depression and grief Chronic Disease Management support and education needs related to effective management of depression and grief 10-06-2023- states that her brother is in Hospice care currently, she lost an Uncle on 2023/11/28 Oct 10, 2023, death anniversary of husband next week (8 years)  Planned Interventions: Evaluation of current treatment plan related to depression and grief and patient's adherence to plan as established by provider. The patient feels she is doing okay right now with her depression and grief.   The patient has a good support system. Currently her brother is in Hospice care, earlier in the week her uncle passed away, next week is the death anniversary of her late husband (8 years). Reflective listening and support.  Advised patient to call the office for changes in mood, anxiety, depression, or acute changes in mental health Provided education to patient re: about the GriefShare and the availability of grief support due to the loss of her husband, mom, brother, and other family members over the last few years. Reminder given of the [place] program and the resources available. Reviewed scheduled/upcoming provider appointments including 12-01-2023 at 0820 am Discussed plans with patient for ongoing care management follow up and provided patient with direct contact  information for care management team Advised patient to discuss changes in her mental health, depression and grief with provider. Reflective listening and support given Screening for signs and symptoms of depression related to chronic disease state  Assessed social determinant of health barriers The patient denies any new issues related to UTI, or changes in her urinary function. Knows to call the office for changes.   Symptom Management: Attend all scheduled provider appointments Attend church or other social activities Work with the social worker to address care coordination needs and will continue to work with the clinical team to address health care and disease management related needs call the Suicide and Crisis Lifeline: 988 call the [place] National Suicide Prevention Lifeline: (910)125-8588 or TTY: 713 656 5405 TTY 804-062-2839) to talk to a trained counselor call 1-800-273-TALK (toll free, 24 hour hotline) if experiencing a Mental Health or Behavioral Health Crisis   Follow Up Plan: Telephone follow up appointment with care management team member scheduled for: 12-09-2023 at 0900 am       RNCM Care Management Expected Outcome:  Monitor, Self-Manage and Reduce Symptoms of Afib       Current Barriers:  Knowledge Deficits related to AFIB and recent hospitalization for new onset of AFIB Care Coordination needs related to resources and management of AFIB in a patient with new onset of AFIB with hospitalization for AFIB with [facility] Chronic Disease Management support and education needs related to effective management of AFIB  Planned Interventions: Provider order and care plan reviewed. The patient feels that the current plan for effective management of her Afib is working well  for her. She states that her AFIB is stable. Saw the cardiologist in September, her lasix was discontinued. Denies any concerns with her AFIB or heart health. Counseled on increased risk of stroke due to Afib and  benefits of anticoagulation for stroke prevention. The patient is aware of the risk of Stroke due to AFIB, knows what Sx and Sx to look for           Reviewed importance of adherence to anticoagulant exactly as prescribed. Is taking medications as directed and is also taking Magnesium 1600 mg daily. The cardiologist does not like this high dose but is trying to keep her levels up of magnesium. Is being followed closely. Education and support given. Takes her medications as directed. Advised patient to discuss changes in her heart rate, changes in her sx and sx, questions and concerns with provider Counseled on bleeding risk associated with AFIB and importance of self-monitoring for signs/symptoms of bleeding Counseled on avoidance of NSAIDs due to increased bleeding risk with anticoagulants Counseled on importance of regular laboratory monitoring as prescribed. Had repeat labs last week for check on Magnesium levels Counseled on seeking medical attention after a head injury or if there is blood in the urine/stool Afib action plan reviewed. The patient is taking Eliquis 5 mg QD. She also has Cardizem to take prn. She has parameters to take the Cardizem. She is also taking Magnesium that is helping her as her Magnesium was low.  Screening for signs and symptoms of depression related to chronic disease state Assessed social determinant of health barriers  Symptom Management: Take medications as prescribed   Attend all scheduled provider appointments Call provider office for new concerns or questions  call the Suicide and Crisis Lifeline: 988 call the [place] National Suicide Prevention Lifeline: (732) 591-1575 or TTY: 513-784-5878 TTY (602)794-6626) to talk to a trained counselor call 1-800-273-TALK (toll free, 24 hour hotline) if experiencing a Mental Health or Behavioral Health Crisis  - check pulse (heart) rate before taking medicine - make a plan to eat healthy - keep all lab appointments - take  medicine as prescribed  Follow Up Plan: Telephone follow up appointment with care management team member scheduled for: 12-09-2023 at 0900 am       RNCM Care Management Expected Outcome:  Monitor, Self-Manage, and Reduce Symptoms of Hypertension       Current Barriers:  Chronic Disease Management support and education needs related to effective management of DM Financial Constraints.   BP Readings from Last 3 Encounters:  08/24/23 120/62  08/19/23 (!) 155/56  08/16/23 (!) 141/70      Planned Interventions: Evaluation of current treatment plan related to hypertension self management and patient's adherence to plan as established by provider. The patient is compliant with the plan of care. Denies any new concerns with HTN. Blood pressures have been fluctuating some but denies any acute findings today.  The patient is doing well. Saw cardiologist recently.  Provided education to patient re: stroke prevention, s/s of heart attack and stroke; Reviewed prescribed diet heart healthy/ADA diet. The patient is eating healthy. Sometimes eats late due to no appetite from the medications she is taking for her DM. Review of healthy snacks. The patient has been having issues with keeping her magnesium level up. Magnesium is causing her to have diarrhea but that is better since she is taking her iron every day. Education on watching for dietary changes and making sure she is following a heart healthy/ADA diet. Reviewed medications with patient  and discussed importance of compliance. The patient is compliant with medications. Review of pharm D changes and the support available if needed. The patient will talk to the pharm D tomorrow at 10 am for reapplying for medication assistance.  Discussed plans with patient for ongoing care management follow up and provided patient with direct contact information for care management team; Advised patient, providing education and rationale, to monitor blood pressure daily  and record, calling PCP for findings outside established parameters;  Advised patient to discuss changes in blood pressure or other heart concerns  with provider; Provided education on prescribed diet heart healthy/ADA diet. Education on foods high in Magnesium: fish, greens, nuts, vegetables, beans ;  Discussed complications of poorly controlled blood pressure such as heart disease, stroke, circulatory complications, vision complications, kidney impairment, sexual dysfunction;  Next pcp appointment on 12-01-2023.  Symptom Management: Take medications as prescribed   Attend all scheduled provider appointments Call pharmacy for medication refills 3-7 days in advance of running out of medications Call provider office for new concerns or questions  call the Suicide and Crisis Lifeline: 988 call the [place] National Suicide Prevention Lifeline: 601-827-7659 or TTY: 2125547354 TTY 838-202-5627) to talk to a trained counselor call 1-800-273-TALK (toll free, 24 hour hotline) if experiencing a Mental Health or Behavioral Health Crisis  check blood pressure weekly learn about high blood pressure call doctor for signs and symptoms of high blood pressure develop an action plan for high blood pressure keep all doctor appointments take medications for blood pressure exactly as prescribed report new symptoms to your doctor  Follow Up Plan: Telephone follow up appointment with care management team member scheduled for: 12-09-2023 at 0900 am       RNCM: Care Management Expected Outcome:  Monitor, Self-Manage and Reduce Symptoms of Diabetes       Current Barriers:  Care Coordination needs related to loss of job and inability to pay for medications in a patient with DM Chronic Disease Management support and education needs related to effective management of DM Financial Constraints- working with pharm D for PAP assistance Difficulty obtaining medications  Lab Results  Component Value Date   HGBA1C  6.8 (H) 07/29/2023    Planned Interventions: Provided education to patient about basic DM disease process. Patients A1C is below goal at most recent visit. The patient sees the pcp again in December will have new blood work.  Reviewed medications with patient and discussed importance of medication adherence. The patient was approved for Ozempic and is on 0.5mg . She is thankful that she gets financial assistance for Ozempic through 2024. The pcp has increased the dose of her Ozempic to 1 mg. She has also been approved for [facility] and is taking this now. Is seeing better readings. The patient has an upcoming appointment with the pharm D tomorrow for submitting paperwork for assistance on medications for 2025. Reviewed prescribed diet with patient heart healthy/ADA diet. Review and education. The patient is eating a snack before bed sometimes. She states that she is not hungry and sometimes she forgets to eat until it is late. Review of ways to have a simple snack so no issues with lows in the mornings.  Counseled on importance of regular laboratory monitoring as prescribed. Has regular lab work ;        Discussed plans with patient for ongoing care management follow up and provided patient with direct contact information for care management team;      Provided patient with written educational materials related  to hypo and hyperglycemia and importance of correct treatment. The highest she has seen lately is 152 recently, and the lowest was 60.    Reviewed scheduled/upcoming provider appointments including: 12-01-2023 with pcp Advised patient, providing education and rationale, to check cbg when you have symptoms of low or high blood sugar and as directed   and record. The patient checks her blood sugars. The patient states her readings have been around 70 to 100 fasting. The lowest she has seen is 60 and she knows how to effectively treat. Review of goal of fasting <130 and post prandial of <180     call  provider for findings outside established parameters;       Referral made to pharmacy team for assistance with financial difficulties of being able to afford medications for effective management of DM. Has been approved for Ozempic through 2024 and is thankful for this. Has an appointment with pharm D on 10-07-2023. Referral made to social work team for assistance with financial barriers, grief and depression. Has worked with the LCSW before, knows to let the RNCM know if she needs additional SW support. ;      Review of patient status, including review of consultants reports, relevant laboratory and other test results, and medications completed;       Advised patient to discuss changes in her DM health and well being with provider;      Screening for signs and symptoms of depression related to chronic disease state;        Assessed social determinant of health barriers;       Patient to schedule an appointment with the eye doctor after the first of the year.  Symptom Management: Take medications as prescribed   Attend all scheduled provider appointments Call pharmacy for medication refills 3-7 days in advance of running out of medications Call provider office for new concerns or questions  Work with the social worker to address care coordination needs and will continue to work with the clinical team to address health care and disease management related needs call the Suicide and Crisis Lifeline: 988 call the [place] National Suicide Prevention Lifeline: 813-100-8151 or TTY: 778 174 9605 TTY (806) 620-7704) to talk to a trained counselor call 1-800-273-TALK (toll free, 24 hour hotline) if experiencing a Mental Health or Behavioral Health Crisis  schedule appointment with eye doctor check feet daily for cuts, sores or redness trim toenails straight across fill half of plate with vegetables manage portion size prepare main meal at home 3 to 5 days each week wash and dry feet carefully every  day wear comfortable, cotton socks wear comfortable, well-fitting shoes  Follow Up Plan: Telephone follow up appointment with care management team member scheduled for: 12-09-2023 at 0900 am           Consent to Services:  Patient was given information about care management services, agreed to services, and gave verbal consent to participate.   Plan: Telephone follow up appointment with care management team member scheduled for: 12-09-2023 at 0900 am  Alto Denver RN, MSN, CCM RN Care Manager  [facility]  Ambulatory Care Management  Direct Number: [phone]

## 2023-10-07 ENCOUNTER — Other Ambulatory Visit: Payer: PPO | Admitting: Pharmacist

## 2023-10-07 NOTE — Progress Notes (Signed)
10/07/2023 Name: Rachel Hensley MRN: [id number] DOB: [birth year]  Chief Complaint  Patient presents with   Medication Assistance   Diabetes    Rachel Hensley is a 67 y.o. year old female who presented for a telephone visit.   They were referred to the pharmacist by their Case Management Team  for assistance in managing diabetes and medication access.   Review of current patient assistance programs: Eliqiuis- [employer], delivers to pt home Synjardy XR - [facility], delivers to pt home [place] - [employer], delivers to pt home  Vascepa - [employer], delivers to pt home through [employer], has 1 full bottle and 1/4 of other bottle Ozempic 1mg  weekly, delivers to Dr Cox office    Subjective:  Care Team: Primary Care Provider: Blane Ohara, MD   Medication Access/Adherence  Current Pharmacy:  [facility] [address], [place] - [address] [place] [address] Phone: [phone] Fax: [phone]  [facility] - [place], [place] - [address] [place] [address] Phone: [phone] Fax: [phone]  [facility] [address] [place] [address] Phone: [phone] Fax: [phone]   Diabetes:  Current medications: synjardy XR 12.5mg -1000mg  BID,glimepiride 4mg  BID,  Medications tried in the past:   Current glucose readings: 60-90s fasting Using traditional meter; testing 1-2 times daily   Patient reports hypoglycemic s/sx including dizziness, shakiness, sweating. Patient denies hyperglycemic symptoms including polyuria, polydipsia, polyphagia, nocturia, neuropathy, blurred vision.   Current medication access support: see above   Objective:  Lab Results  Component Value Date   HGBA1C 6.8 (H) 07/29/2023    Lab Results  Component Value Date   CREATININE 0.90 07/29/2023   BUN 24 07/29/2023   NA 140 07/29/2023   K  4.9 07/29/2023   CL 101 07/29/2023   CO2 22 07/29/2023    Lab Results  Component Value Date   CHOL 139 07/29/2023   HDL 41 07/29/2023   LDLCALC 57 07/29/2023   TRIG 262 (H) 07/29/2023   CHOLHDL 3.4 07/29/2023    Medications Reviewed Today     Reviewed by Gabriel Carina, RPH (Pharmacist) on 10/07/23 at 1533  Med List Status: <None>   Medication Order Taking? Sig Documenting Provider Last Dose Status Informant  apixaban (ELIQUIS) 5 MG TABS tablet [id number] Yes Take 1 tablet (5 mg total) by mouth 2 (two) times daily. Cox, Kirsten, MD Taking Active   atorvastatin (LIPITOR) 20 MG tablet [id number] Yes Take 1 tablet (20 mg total) by mouth daily. Cox, Kirsten, MD Taking Active   cetirizine (ZYRTEC) 10 MG tablet [id number] Yes Take 10 mg by mouth daily. [provider] Taking Active   diltiazem (CARDIZEM) 30 MG tablet [id number] Yes Take 30 mg by mouth every 6 (six) hours as needed (HR). [provider] Taking Active   Empagliflozin-metFORMIN HCl ER (SYNJARDY XR) 12.04-999 MG TB24 [id number] Yes Take 1 tablet by mouth in the morning and at bedtime. [provider] Taking Active   EPINEPHrine 0.3 mg/0.3 mL IJ SOAJ injection [id number] Yes Inject 0.3 mg into the muscle once. [provider] Taking Active   Finerenone [facility]) 10 MG TABS [id number] Yes Take 1 tablet by mouth daily. [provider] Taking Active   flecainide (TAMBOCOR) 50 MG tablet [id number] Yes Take 1 tablet (50 mg total) by mouth 2 (two) times daily. Georgeanna Lea, MD Taking Active  glimepiride (AMARYL) 4 MG tablet [id number] Yes Take 1 tablet by mouth twice daily Cox, Kirsten, MD Taking Active   icosapent Ethyl (VASCEPA) 1 g capsule [id number] Yes Take 2 capsules (2 g total) by mouth 2 (two) times daily. Cox, Kirsten, MD Taking Active   Iron, Ferrous Sulfate, 325 (65 Fe) MG TABS [id number] No Take 325 mg by mouth daily.  Patient not taking: Reported on 10/07/2023   Blane Ohara, MD  Not Taking Active            Med Note Tyler Deis Oct 07, 2023 10:16 AM) Was told to discontinue after iron infusions ~07/2023   magnesium oxide (MAG-OX) 400 (240 Mg) MG tablet [id number] Yes Take 800 mg by mouth 2 (two) times daily. [provider] Taking Active   metoprolol succinate (TOPROL-XL) 50 MG 24 hr tablet [id number] Yes Take 1 tablet (50 mg total) by mouth daily. Take with or immediately following a meal. Georgeanna Lea, MD Taking Active   omeprazole (PRILOSEC) 20 MG capsule [id number] Yes Take 1 capsule (20 mg total) by mouth 2 (two) times daily. Cox, Kirsten, MD Taking Active   ondansetron (ZOFRAN ODT) 4 MG disintegrating tablet [id number] Yes Take 1 tablet (4 mg total) by mouth every 8 (eight) hours as needed for nausea or vomiting. Janie Morning, NP Taking Active   promethazine (PHENERGAN) 25 MG tablet [id number] Yes Take 1 tablet (25 mg total) by mouth every 8 (eight) hours as needed for nausea or vomiting. Janie Morning, NP Taking Active   Semaglutide, 1 MG/DOSE, 4 MG/3ML SOPN [id number] Yes Inject 1 mg as directed once a week. Cox, Kirsten, MD Taking Active               Assessment/Plan:   Diabetes: - Currently controlled - Reviewed goal A1c, goal fasting, and goal 2 hour post prandial glucose - Recommend to stop glimepiride 4mg  BID for one week, check and document blood sugars, will review in 1 week. Goal to resolve hypoglycemia. - Meets financial criteria for patient assistance programs for the below medications. Will collaborate with provider, CPhT, and patient to pursue assistance.   Review of current patient assistance programs: Eliqiuis- [employer], delivers to pt home Synjardy XR - [facility], delivers to pt home [place] - [employer], delivers to pt home  Vascepa - [employer], delivers to pt home through [employer], has 1 full bottle and 1/4 of other bottle Ozempic 1mg  weekly, delivers to Dr Cox office    Follow Up  Plan: 1 week  Lynnda Shields, PharmD, BCPS Clinical Pharmacist [facility]

## 2023-10-11 ENCOUNTER — Other Ambulatory Visit: Payer: Self-pay | Admitting: Family Medicine

## 2023-10-13 ENCOUNTER — Encounter: Payer: Self-pay | Admitting: Oncology

## 2023-10-13 ENCOUNTER — Other Ambulatory Visit (HOSPITAL_COMMUNITY): Payer: Self-pay

## 2023-10-13 NOTE — Progress Notes (Signed)
10/14/2023 Name: Rachel Hensley MRN: [id number] DOB: 1956-03-24  Chief Complaint  Patient presents with   Diabetes   Medication Assistance    Rachel Hensley is a 67 y.o. year old female who presented for a telephone visit.   They were referred to the pharmacist by their Case Management Team  for assistance in managing diabetes and medication access.   Review of current patient assistance programs: Eliqiuis- [employer], delivers to pt home Synjardy XR - [facility], delivers to pt home [place] - [employer] [place], delivers to pt home  Vascepa - Advertising copy[employer], delivers to pt home through [employer], has 1 bottle remaining Ozempic 1mg  weekly, delivers to Dr Cox office    Subjective:  Care Team: Primary Care Provider: Blane Ohara, MD   Medication Access/Adherence  Current Pharmacy:  [facility] [address] [place] - [address] [place] [address] Phone: [phone] Fax: [phone]  [facility] - [place], [place] - [address] [place] [address] Phone: [phone] Fax: [phone]  Gerri Spore LONG - [facility] [address] [place] [address] Phone: [phone] Fax: [phone]   Diabetes:  Current medications: synjardy XR 12.5mg -1000mg  BID,glimepiride 4mg  BID (tried 1 week off of this medication to resolve hypoglycemia), ozempic 1mg  weekly   Current glucose readings: 98, 137, 137, 158, 143, 178 when trying glimepiride off for 1 week to resolve hypoglycemia.   Previously 60-90s when fasting.  Using traditional meter; testing 1-2 times daily  Patient reports hypoglycemic s/sx including dizziness, shakiness, sweating. Patient denies hyperglycemic symptoms including polyuria, polydipsia, polyphagia, nocturia, neuropathy, blurred vision.  Current medication access support: see above   Objective:  Lab Results  Component Value Date    HGBA1C 6.8 (H) 07/29/2023    Lab Results  Component Value Date   CREATININE 0.90 07/29/2023   BUN 24 07/29/2023   NA 140 07/29/2023   K 4.9 07/29/2023   CL 101 07/29/2023   CO2 22 07/29/2023    Lab Results  Component Value Date   CHOL 139 07/29/2023   HDL 41 07/29/2023   LDLCALC 57 07/29/2023   TRIG 262 (H) 07/29/2023   CHOLHDL 3.4 07/29/2023    Medications Reviewed Today     Reviewed by Gabriel Carina, RPH (Pharmacist) on 10/14/23 at 1044  Med List Status: <None>   Medication Order Taking? Sig Documenting Provider Last Dose Status Informant  apixaban (ELIQUIS) 5 MG TABS tablet [id number]  Take 1 tablet (5 mg total) by mouth 2 (two) times daily. Cox, Kirsten, MD  Active   atorvastatin (LIPITOR) 20 MG tablet [id number]  Take 1 tablet by mouth once daily Cox, Kirsten, MD  Active   cetirizine (ZYRTEC) 10 MG tablet [id number]  Take 10 mg by mouth daily. [provider]  Active   diltiazem (CARDIZEM) 30 MG tablet [id number]  Take 30 mg by mouth every 6 (six) hours as needed (HR). [provider]  Active   Empagliflozin-metFORMIN HCl ER (SYNJARDY XR) 12.04-999 MG TB24 [id number]  Take 1 tablet by mouth in the morning and at bedtime. [provider]  Active   EPINEPHrine 0.3 mg/0.3 mL IJ SOAJ injection [id number]  Inject 0.3 mg into the muscle once. [provider]  Active   Finerenone (KERENDIA) 10 MG TABS [id number]  Take 1 tablet by mouth daily. [provider]  Active   flecainide (TAMBOCOR) 50 MG tablet [id number]  Take 1 tablet (50 mg total) by mouth 2 (two) times daily. Georgeanna Lea, MD  Active   glimepiride (AMARYL) 4 MG tablet [id number]  Take 1 tablet by mouth twice daily Cox, Kirsten, MD  Active   icosapent Ethyl (VASCEPA) 1 g capsule [id number]  Take 2 capsules (2 g total) by mouth 2 (two) times daily. Cox, Kirsten, MD  Active   Iron, Ferrous Sulfate, 325 (65 Fe) MG TABS [id number]  Take 325 mg by mouth daily.  Patient not taking:  Reported on 10/07/2023   Blane Ohara, MD  Active            Med Note Clide Cliff, Nelwyn Salisbury Oct 07, 2023 10:16 AM) Was told to discontinue after iron infusions ~07/2023   magnesium oxide (MAG-OX) 400 (240 Mg) MG tablet [id number]  Take 800 mg by mouth 2 (two) times daily. [provider]  Active   metoprolol succinate (TOPROL-XL) 50 MG 24 hr tablet [id number]  Take 1 tablet (50 mg total) by mouth daily. Take with or immediately following a meal. Georgeanna Lea, MD  Active   omeprazole (PRILOSEC) 20 MG capsule [id number]  Take 1 capsule by mouth twice daily Cox, Kirsten, MD  Active   ondansetron (ZOFRAN ODT) 4 MG disintegrating tablet [id number]  Take 1 tablet (4 mg total) by mouth every 8 (eight) hours as needed for nausea or vomiting. Janie Morning, NP  Active   promethazine (PHENERGAN) 25 MG tablet [id number]  Take 1 tablet (25 mg total) by mouth every 8 (eight) hours as needed for nausea or vomiting. Janie Morning, NP  Active   Semaglutide, 1 MG/DOSE, 4 MG/3ML SOPN [id number]  Inject 1 mg as directed once a week. Cox, Kirsten, MD  Active              Assessment/Plan:   Diabetes: - Currently controlled - Reviewed goal A1c, goal fasting, and goal 2 hour post prandial glucose - Recommend to adjust to glimepiride 4mg  ONCE daily (instead of prior twice daily), initially tried last week to discontinue altogether to avoid hypoglycemia, but BG numbers without glimepiride elevated >130. Therefore will try once daily. - Meets financial criteria for patient assistance programs for the below medications. Will collaborate with provider, CPhT, and patient to pursue assistance. Screened for Medicare LIS and patient does not qualify. - Sent request for rx med assistance team to mail 2025 applications 10/07/23 to patient, will monitor progress   Review of current patient assistance programs: Eliqiuis- [employer], delivers to pt home Synjardy XR - [facility], delivers to pt  home [place] - [employer] [place], delivers to pt home  Vascepa - [employer], delivers to pt home through [employer], has 1 full bottle and 1/4 of other bottle Ozempic 1mg  weekly, delivers to Dr Cox office    [employer] access opened 10/12/23 for re-enrollment. Signed up for patient, and collaborated with dispensing pharmacy to provide the following details to apply to vascepa copays:  [employer] Patient ID: [id number] Approved 09/23/23 - 09/21/24   Card No. [id number] Card Status Active BIN [id number] PCN [id number] PC Group [id number]  Follow Up Plan: 1 week to review BG   Lynnda Shields, PharmD, BCPS Clinical Pharmacist [facility]

## 2023-10-14 ENCOUNTER — Other Ambulatory Visit: Payer: Self-pay | Admitting: Pharmacist

## 2023-10-21 ENCOUNTER — Other Ambulatory Visit: Payer: Self-pay | Admitting: Pharmacist

## 2023-10-21 NOTE — Progress Notes (Signed)
10/21/2023 Name: Rachel Hensley MRN: [id number] DOB: [birth year]  Chief Complaint  Patient presents with   Diabetes   Medication Assistance    Rachel Hensley is a 67 y.o. year old female who presented for a telephone visit.   They were referred to the pharmacist by their Case Management Team  for assistance in managing diabetes and medication access.   Review of current patient assistance programs: Eliqiuis- [employer], delivers to pt home Synjardy XR - [facility], delivers to pt home [place] - [employer] [place], delivers to pt home  Vascepa - [employer]vertising copywriter, delivers to pt home through [employer], has 1 bottle remaining Ozempic 1mg  weekly, delivers to Dr Cox office    Subjective:  Care Team: Primary Care Provider: Blane Ohara, MD   Medication Access/Adherence  Current Pharmacy:  [facility] [address] [place] - [address] [place] [address] Phone: [phone] Fax: [phone]  [facility] - [place], [place] - [address] [place] [address] Phone: [phone] Fax: [phone]  Gerri Spore LONG - [facility] [address] [place] [address] Phone: [phone] Fax: [phone]   Diabetes:  Current medications: synjardy XR 12.5mg -1000mg  BID,glimepiride 4mg  BID (tried 1 week reducing medication to once daily to resolve hypoglycemia), ozempic 1mg  weekly   Current glucose readings: 91, 137, 113, 111, 134, 90 when doing a 1 week trial of reducing glimepiride 4mg  to ONCE daily from twice daily  (BG 98, 137, 137, 158, 143, 178 when off glimepiride altogether)  Previously 60-90s at twice daily glimepiride, wanted to avoid hypoglycemia.  Using traditional meter; testing 1-2 times daily  Patient reports hypoglycemic s/sx including dizziness, shakiness, sweating. Patient denies hyperglycemic symptoms including polyuria, polydipsia, polyphagia,  nocturia, neuropathy, blurred vision.  Current medication access support: see above   Objective:  Lab Results  Component Value Date   HGBA1C 6.8 (H) 07/29/2023    Lab Results  Component Value Date   CREATININE 0.90 07/29/2023   BUN 24 07/29/2023   NA 140 07/29/2023   K 4.9 07/29/2023   CL 101 07/29/2023   CO2 22 07/29/2023    Lab Results  Component Value Date   CHOL 139 07/29/2023   HDL 41 07/29/2023   LDLCALC 57 07/29/2023   TRIG 262 (H) 07/29/2023   CHOLHDL 3.4 07/29/2023    Medications Reviewed Today     Reviewed by Gabriel Carina, RPH (Pharmacist) on 10/21/23 at 1604  Med List Status: <None>   Medication Order Taking? Sig Documenting Provider Last Dose Status Informant  apixaban (ELIQUIS) 5 MG TABS tablet [id number] No Take 1 tablet (5 mg total) by mouth 2 (two) times daily. Cox, Kirsten, MD Taking Active   atorvastatin (LIPITOR) 20 MG tablet [id number] No Take 1 tablet by mouth once daily Cox, Kirsten, MD Taking Active   cetirizine (ZYRTEC) 10 MG tablet [id number] No Take 10 mg by mouth daily. [provider] Taking Active   diltiazem (CARDIZEM) 30 MG tablet [id number] No Take 30 mg by mouth every 6 (six) hours as needed (HR). [provider] Taking Active   Empagliflozin-metFORMIN HCl ER (SYNJARDY XR) 12.04-999 MG TB24 [id number] No Take 1 tablet by mouth in the morning and at bedtime. [provider] Taking Active   EPINEPHrine 0.3 mg/0.3 mL IJ SOAJ injection [id number] No Inject 0.3 mg into the muscle once. [provider] Taking Active   Finerenone [facility]) 10  MG TABS [id number] No Take 1 tablet by mouth daily. [provider] Taking Active   flecainide (TAMBOCOR) 50 MG tablet [id number] No Take 1 tablet (50 mg total) by mouth 2 (two) times daily. Georgeanna Lea, MD Taking Active   glimepiride (AMARYL) 4 MG tablet [id number] No Take 1 tablet by mouth twice daily Cox, Kirsten, MD Taking Active   icosapent Ethyl (VASCEPA)  1 g capsule [id number] No Take 2 capsules (2 g total) by mouth 2 (two) times daily. Cox, Kirsten, MD Taking Active   Iron, Ferrous Sulfate, 325 (65 Fe) MG TABS [id number] No Take 325 mg by mouth daily. Blane Ohara, MD Taking Active            Med Note Tyler Deis Oct 07, 2023 10:16 AM) Was told to discontinue after iron infusions ~07/2023   magnesium oxide (MAG-OX) 400 (240 Mg) MG tablet [id number] No Take 800 mg by mouth 2 (two) times daily. [provider] Taking Active   metoprolol succinate (TOPROL-XL) 50 MG 24 hr tablet [id number] No Take 1 tablet (50 mg total) by mouth daily. Take with or immediately following a meal. Georgeanna Lea, MD Taking Active   omeprazole (PRILOSEC) 20 MG capsule [id number] No Take 1 capsule by mouth twice daily Cox, Kirsten, MD Taking Active   ondansetron (ZOFRAN ODT) 4 MG disintegrating tablet [id number] No Take 1 tablet (4 mg total) by mouth every 8 (eight) hours as needed for nausea or vomiting. Janie Morning, NP Taking Active   promethazine (PHENERGAN) 25 MG tablet [id number] No Take 1 tablet (25 mg total) by mouth every 8 (eight) hours as needed for nausea or vomiting. Janie Morning, NP Taking Active   Semaglutide, 1 MG/DOSE, 4 MG/3ML SOPN [id number] No Inject 1 mg as directed once a week. Cox, Kirsten, MD Taking Active              Assessment/Plan:   Diabetes: - Currently controlled - Reviewed goal A1c, goal fasting, and goal 2 hour post prandial glucose - Recommend to continue glimepiride 4mg  ONCE daily (instead of prior twice daily), BG at appropriate range for control and successfully avoided hypoglycemia - Meets financial criteria for patient assistance programs for the below medications. Will collaborate with provider, CPhT, and patient to pursue assistance. Screened for Medicare LIS and patient does not qualify. - Previously sent request for rx med assistance team to mail 2025 applications 10/07/23 to patient, will monitor  progress   Review of current patient assistance programs: Eliqiuis- [employer], delivers to pt home Synjardy XR - [facility], delivers to pt home [place] - [employer] [place], delivers to pt home  Vascepa - [employer], delivers to pt home through [employer], has 1 full bottle and 1/4 of other bottle Ozempic 1mg  weekly, delivers to Dr Cox office    [employer] access opened 10/12/23 for re-enrollment. Signed up for patient, and collaborated with dispensing pharmacy to provide the following details to apply to vascepa copays:  Healthwell Patient ID: [id number] Approved 09/23/23 - 09/21/24   Card No. [id number] Card Status Active BIN [id number] PCN [id number] PC Group [id number]  Follow Up Plan: no follow up scheduled, recommend pharmacist team outreach to assess diabetes control in 1-2 months, possibly consider titrate ozempic to discontinue glimepiride altogether.  Lynnda Shields, PharmD, BCPS Clinical Pharmacist [facility]

## 2023-10-25 ENCOUNTER — Telehealth: Payer: Self-pay

## 2023-10-25 NOTE — Telephone Encounter (Signed)
-----   Message from Gabriel Carina sent at 10/07/2023  3:33 PM EDT ----- Hi,  Can you please mail patient 2025 applications and return labeling for: Eliqiuis- bristol myers squibb Synjardy XR - [employer] Kerendia - [employer] [place] Ozempic 1mg  weekly - novonordisk Thanks, [employer]

## 2023-10-25 NOTE — Telephone Encounter (Signed)
PAP: Application for Ozempic KERENDIA has been submitted to PAP Companies: [employer], online   PAP: PAP application for [employer], ([employer] (BI Cares) and [employer] (BMS)) has been mailed to pt's home address on file. Will fax provider portion of application to provider's office when pt's portion is received.

## 2023-10-28 NOTE — Telephone Encounter (Signed)
Received fax from [employer] requesting healthcare professional section (page 6) be faxed to [phone] to complete application.  CASE NUMBER: [id number]

## 2023-10-29 NOTE — Telephone Encounter (Signed)
KERENDIA WAS submitted to [employer], ELECTRONICALLY AND RECEIVED NOTIFICATION THAT THE PROVIDER PAGE WAS MISSING.  I HAVE FAXED PROVIDER PAGE TO OFFICE OF KIRSTEN COX.   PLEASE BE ADVISED THAT WHEN WE RECEIVE THE PROVIDER PAGES BACK WE WILL SEND TO COMPANY FOR PROCESSING.

## 2023-11-01 ENCOUNTER — Telehealth: Payer: Self-pay | Admitting: Family Medicine

## 2023-11-01 NOTE — Telephone Encounter (Signed)
PT IS CALLING INTO THE OFFICE WITH INFORMATION FOR THE PHARM TEAM - PLEASE ADVISE. THANK YOU

## 2023-11-08 NOTE — Progress Notes (Signed)
Pharmacy Medication Assistance Program Note    11/08/2023  Patient ID: Rachel Hensley, female   DOB: Aug 01, 1956, 67 y.o.   MRN: [id number]     10/25/2023  Outreach Medication One  Initial Outreach Date (Medication One) 10/25/2023   10/25/2023  Manufacturer Medication One Boehringer Ingelheim  Type of Radiographer, therapeutic Assistance  Date Application Sent to Patient 10/26/2023  Application Items Requested Application;Proof of Income  Name of Prescriber KIRSTEN COX  Method Application Sent to [employer]     Multiple values from one day are sorted in reverse-chronological order     Signature    11/08/2023  Patient ID: Rachel Hensley, female  DOB: December 30, 1955, 67 y.o.  MRN:  [id number]     10/25/2023  Outreach Medication Two  Initial Outreach Date (Medication Two) 10/25/2023  Manufacturer Medication Two Other Manufacturer/Drug  Other Drugs KERENDIA  Dose of Other Drug 10MG   Method Application Sent to Manufacturer Online       Signature   11/08/2023  Patient ID: Rachel Hensley, female  DOB: [birth year], 67 y.o.  MRN:  [id number]     10/25/2023  Outreach Medication Three  Manufacturer Medication Three [employer]  [facility] (Med Three) Eliquis  Dose of Eliquis 5MG   Type of Radiographer, therapeutic Assistance  Date Application Sent to Patient 10/26/2023  Application Items Requested Application;Proof of Income  Name of Prescriber KIRSTEN COX       SIGNATURE     11/08/2023  Patient ID: Rachel Hensley, female  DOB: [birth year], 66 y.o.  MRN:  [id number]     10/25/2023  Outreach Medication Four  Initial Outreach Date (Medication Four) 10/25/2023  Manufacturer Medication Four Novo Nordisk  [facility] Ozempic  Dose of Ozempic 1MG   Type of Radiographer, therapeutic Assistance  Date Application Sent to Patient 10/25/2023  Application Items Requested Application;Proof of Income  Name of Prescriber KIRSTEN  COX  Method Application Sent to Manufacturer Online  Anticipated Follow Up Date With Manufacturer 11/08/2023       SIGNATURE

## 2023-11-08 NOTE — Telephone Encounter (Signed)
PAP: Application for KERENDIA has been submitted to PAP Companies: [employer], via fax  PLEASE BE ADVISED THAT KERENDIA WAS E-FILED I HAVE SENT (PROVIDER PAGE)  PRESCRIPTION TO COMPANY

## 2023-11-08 NOTE — Progress Notes (Deleted)
 Pharmacy Medication Assistance Program Note  {MEDASSISTINITIAL OUTREACH:31507}

## 2023-11-09 ENCOUNTER — Other Ambulatory Visit: Payer: Self-pay

## 2023-11-09 ENCOUNTER — Other Ambulatory Visit (HOSPITAL_COMMUNITY): Payer: Self-pay

## 2023-11-09 ENCOUNTER — Encounter: Payer: Self-pay | Admitting: Oncology

## 2023-11-09 ENCOUNTER — Other Ambulatory Visit: Payer: Self-pay | Admitting: Family Medicine

## 2023-11-09 MED ORDER — ICOSAPENT ETHYL 1 G PO CAPS
2.0000 g | ORAL_CAPSULE | Freq: Two times a day (BID) | ORAL | 1 refills | Status: DC
  Filled 2023-11-09: qty 360, 90d supply, fill #0
  Filled 2024-02-02: qty 360, 90d supply, fill #1

## 2023-11-10 ENCOUNTER — Other Ambulatory Visit: Payer: Self-pay

## 2023-11-15 NOTE — Progress Notes (Unsigned)
[facility]  [address] [place],  [place]  [address] [phone]  Clinic Day:  11/16/2023  Referring physician: Blane Ohara, MD   HISTORY OF PRESENT ILLNESS:  The patient is a 67 y.o. female  who I recently began seeing for iron deficiency anemia.  She comes in today to reassess her labs to see how well she responded to her IV iron.  Since receiving it, she has felt better.  She continues to deny having any overt forms of blood loss to explain her iron deficiency anemia.    PHYSICAL EXAM:  Blood pressure 138/69, pulse 76, temperature 98.6 F (37 C), resp. rate 14, height 5\' 3"  (1.6 m), weight 149 lb 1.6 oz (67.6 kg), SpO2 100%. Wt Readings from Last 3 Encounters:  11/16/23 149 lb 1.6 oz (67.6 kg)  08/24/23 151 lb 0.6 oz (68.5 kg)  08/19/23 152 lb (68.9 kg)   Body mass index is 26.41 kg/m. Performance status (ECOG): 1 - Symptomatic but completely ambulatory Physical Exam Constitutional:      Appearance: Normal appearance. She is not ill-appearing.  HENT:     Mouth/Throat:     Mouth: Mucous membranes are moist.     Pharynx: Oropharynx is clear. No oropharyngeal exudate or posterior oropharyngeal erythema.  Cardiovascular:     Rate and Rhythm: Normal rate and regular rhythm.     Heart sounds: No murmur heard.    No friction rub. No gallop.  Pulmonary:     Effort: Pulmonary effort is normal. No respiratory distress.     Breath sounds: Normal breath sounds. No wheezing, rhonchi or rales.  Abdominal:     General: Bowel sounds are normal. There is no distension.     Palpations: Abdomen is soft. There is no mass.     Tenderness: There is no abdominal tenderness.  Musculoskeletal:        General: No swelling.     Right lower leg: No edema.     Left lower leg: No edema.  Lymphadenopathy:     Cervical: No cervical adenopathy.     Upper Body:     Right upper body: No supraclavicular or axillary adenopathy.     Left upper body: No  supraclavicular or axillary adenopathy.     Lower Body: No right inguinal adenopathy. No left inguinal adenopathy.  Skin:    General: Skin is warm.     Coloration: Skin is not jaundiced.     Findings: No lesion or rash.  Neurological:     General: No focal deficit present.     Mental Status: She is alert and oriented to person, place, and time. Mental status is at baseline.  Psychiatric:        Mood and Affect: Mood normal.        Behavior: Behavior normal.        Thought Content: Thought content normal.    LABS:      Latest Ref Rng & Units 11/16/2023    9:49 AM 07/29/2023    9:16 AM 04/09/2023    8:47 AM  CBC  WBC 4.0 - 10.5 K/uL 9.6  9.3  8.7   Hemoglobin 12.0 - 15.0 g/dL 84.6  96.2  95.2   Hematocrit 36.0 - 46.0 % 43.5  39.5  37.8   Platelets 150 - 400 K/uL 245  363  344       Latest Ref Rng & Units 07/29/2023    9:16 AM 04/09/2023    8:47  AM 02/19/2023   11:14 AM  CMP  Glucose 70 - 99 mg/dL 628  315  176   BUN 8 - 27 mg/dL 24  23  20    Creatinine 0.57 - 1.00 mg/dL 1.60  7.37  1.06   Sodium 134 - 144 mmol/L 140  139  139   Potassium 3.5 - 5.2 mmol/L 4.9  5.0  4.8   Chloride 96 - 106 mmol/L 101  98  99   CO2 20 - 29 mmol/L 22  23  23    Calcium 8.7 - 10.3 mg/dL 26.9  48.5  46.2   Total Protein 6.0 - 8.5 g/dL 7.2  7.6    Total Bilirubin 0.0 - 1.2 mg/dL 0.4  0.4    Alkaline Phos 44 - 121 IU/L 94  108    AST 0 - 40 IU/L 21  23    ALT 0 - 32 IU/L 22  25      Latest Reference Range & Units 07/29/23 09:16 11/16/23 09:49  Iron 28 - 170 ug/dL 39 73  UIBC ug/dL 703 (H) 500  TIBC 938 - 450 ug/dL 182 (H) 993  Saturation Ratios 10.4 - 31.8 %  17  Ferritin 11 - 307 ng/mL 10 (L) 106  (H): Data is abnormally high (L): Data is abnormally low  ASSESSMENT & PLAN:  A 67 y.o. female with iron deficiency anemia.  I am very pleased as her iron and hemoglobin levels have all improved after receiving a recent course of IV iron.  Clinically, the patient appears to be doing well.  As that  is the case, I will see her back in 6 months for repeat clinical assessment.  The patient understands all the plans discussed today and is in agreement with them.  Rachel Hensley Kirby Funk, MD

## 2023-11-16 ENCOUNTER — Telehealth: Payer: Self-pay

## 2023-11-16 ENCOUNTER — Inpatient Hospital Stay: Payer: PPO

## 2023-11-16 ENCOUNTER — Inpatient Hospital Stay: Payer: PPO | Attending: Oncology | Admitting: Oncology

## 2023-11-16 ENCOUNTER — Other Ambulatory Visit: Payer: Self-pay | Admitting: Oncology

## 2023-11-16 VITALS — BP 138/69 | HR 76 | Temp 98.6°F | Resp 14 | Ht 63.0 in | Wt 149.1 lb

## 2023-11-16 DIAGNOSIS — D509 Iron deficiency anemia, unspecified: Secondary | ICD-10-CM | POA: Diagnosis present

## 2023-11-16 DIAGNOSIS — D508 Other iron deficiency anemias: Secondary | ICD-10-CM

## 2023-11-16 LAB — CBC WITH DIFFERENTIAL (CANCER CENTER ONLY)
Abs Immature Granulocytes: 0.04 10*3/uL (ref 0.00–0.07)
Basophils Absolute: 0 10*3/uL (ref 0.0–0.1)
Basophils Relative: 0 %
Eosinophils Absolute: 0.2 10*3/uL (ref 0.0–0.5)
Eosinophils Relative: 2 %
HCT: 43.5 % (ref 36.0–46.0)
Hemoglobin: 14.9 g/dL (ref 12.0–15.0)
Immature Granulocytes: 0 %
Lymphocytes Relative: 35 %
Lymphs Abs: 3.3 10*3/uL (ref 0.7–4.0)
MCH: 31.1 pg (ref 26.0–34.0)
MCHC: 34.3 g/dL (ref 30.0–36.0)
MCV: 90.8 fL (ref 80.0–100.0)
Monocytes Absolute: 0.5 10*3/uL (ref 0.1–1.0)
Monocytes Relative: 5 %
Neutro Abs: 5.4 10*3/uL (ref 1.7–7.7)
Neutrophils Relative %: 58 %
Platelet Count: 245 10*3/uL (ref 150–400)
RBC: 4.79 MIL/uL (ref 3.87–5.11)
RDW: 16.3 % — ABNORMAL HIGH (ref 11.5–15.5)
WBC Count: 9.6 10*3/uL (ref 4.0–10.5)
nRBC: 0 % (ref 0.0–0.2)
nRBC: 0 /100{WBCs}

## 2023-11-16 LAB — CMP (CANCER CENTER ONLY)
ALT: 27 U/L (ref 0–44)
AST: 26 U/L (ref 15–41)
Albumin: 4.4 g/dL (ref 3.5–5.0)
Alkaline Phosphatase: 94 U/L (ref 38–126)
Anion gap: 15 (ref 5–15)
BUN: 19 mg/dL (ref 8–23)
CO2: 24 mmol/L (ref 22–32)
Calcium: 10.2 mg/dL (ref 8.9–10.3)
Chloride: 100 mmol/L (ref 98–111)
Creatinine: 0.97 mg/dL (ref 0.44–1.00)
GFR, Estimated: >60 mL/min (ref >60)
Glucose, Bld: 141 mg/dL — ABNORMAL HIGH (ref 70–99)
Potassium: 4.4 mmol/L (ref 3.5–5.1)
Sodium: 139 mmol/L (ref 135–145)
Total Bilirubin: 0.4 mg/dL (ref <1.2)
Total Protein: 7.4 g/dL (ref 6.5–8.1)

## 2023-11-16 LAB — IRON AND TIBC
Iron: 73 ug/dL (ref 28–170)
Saturation Ratios: 17 % (ref 10.4–31.8)
TIBC: 426 ug/dL (ref 250–450)
UIBC: 353 ug/dL

## 2023-11-16 LAB — FERRITIN: Ferritin: 106 ng/mL (ref 11–307)

## 2023-11-16 NOTE — Telephone Encounter (Signed)
Latest Reference Range & Units 07/29/23 09:16 11/16/23 09:49  Iron 28 - 170 ug/dL 39 73  UIBC ug/dL 161 (H) 096  TIBC 045 - 450 ug/dL 409 (H) 811  Saturation Ratios 10.4 - 31.8 %   17  Ferritin 11 - 307 ng/mL 10 (L) 106  (H): Data is abnormally high (L): Data is abnormally low   ASSESSMENT & PLAN:  A 67 y.o. female with iron deficiency anemia.  I am very pleased as her iron and hemoglobin levels have all improved after receiving a recent course of IV iron.  Clinically, the patient appears to be doing well.  As that is the case, I will see her back in 6 months for repeat clinical assessment.  The patient understands all the plans discussed today and is in agreement with them.   Dequincy Kirby Funk, MD

## 2023-11-17 ENCOUNTER — Telehealth: Payer: Self-pay

## 2023-11-17 NOTE — Telephone Encounter (Signed)
Patient aware that ozempic is available for pick from patient assistance. [id number]. 4 boxes.

## 2023-11-18 ENCOUNTER — Ambulatory Visit (INDEPENDENT_AMBULATORY_CARE_PROVIDER_SITE_OTHER): Payer: PPO

## 2023-11-18 DIAGNOSIS — Z23 Encounter for immunization: Secondary | ICD-10-CM | POA: Diagnosis not present

## 2023-11-18 NOTE — Telephone Encounter (Signed)
PT PICKED UP MEDICATION SIGNED BLUE FORM LEFT IN Creola Corn, LPN BOX

## 2023-11-24 NOTE — Telephone Encounter (Signed)
RECEIVED COMPLETED PATIENT APPLICATION FOR SYNJARDY AND ELIQUIS, WAITING ON PROVIDER PORTION TO FAX COMPLETED APPLICATIONS IN FOR REVIEW

## 2023-11-30 NOTE — Progress Notes (Signed)
Subjective:  Patient ID: Rachel Hensley, female    DOB: 1956/07/30  Age: 67 y.o. MRN: [id number]  Chief Complaint  Patient presents with   Medical Management of Chronic Issues    HPI The patient, with a history of diabetes, atrial fibrillation, and arthritis, presents for a four month follow up. She reports a previous episode of hypoglycemia with blood glucose in the forties during the summer. Since then, she has been managing her diabetes with glimepiride 4mg  twice daily. She reports a brief period of discontinuing the evening dose due to concerns of hypoglycemia, but resumed the medication due to rising blood glucose levels. She denies any recent episodes of hypoglycemia and reports blood glucose levels no lower than the nineties.  Diabetes:  Complications: nephropathy.  Glucose checking: daily  Glucose logs: 90-180 Hypoglycemia: no Most recent A1C: 6.9% Current medications:  Glimepiride 4 mg BID, Ozempic 1 mg weekly, Kerendia 10 mg daily, Synjardy 1000 mg twice daily.   Last Eye Exam: 08/2023.  Foot checks: daily. Eating healthy.  Not exercising due to knee pain.  Hyperlipidemia: Current medications: Vascepa 1 gm 2 capsules twice a day and atorvastatin 20 mg once daily.    Hypertension: Current medications: metoprolol 50 mg daily,   Atrial fibrillation: flecainide, metoprolol and eliquis.  GERD: omeprazole 20 mg twice daily. Well controlled.   Diet: fairly healthy Exercise: had to stop.   The patient also reports new onset left lower back and leg pain. The pain radiates from the lower back down the thigh and into the lower leg, which also experiences numbness. The pain and numbness are exacerbated by standing for prolonged periods and relieved by sitting. She reports a history of arthritis in the back and a previous knee injection for pain. She denies any recent imaging or treatment for this new pain.  The patient also reports constipation alternating with diarrhea, but  denies persistent diarrhea.     12/01/2023    8:29 AM 08/16/2023    4:19 PM 07/29/2023    8:38 AM 06/02/2023    3:56 PM 04/09/2023    8:20 AM  Depression screen PHQ 2/9  Decreased Interest 0 0 0 0 0  Down, Depressed, Hopeless 0 0 1 0 0  PHQ - 2 Score 0 0 1 0 0  Altered sleeping 1  3    Tired, decreased energy 1  3    Change in appetite 0  1    Feeling bad or failure about yourself  0  0    Trouble concentrating 0  0    Moving slowly or fidgety/restless 0  0    Suicidal thoughts 0  0    PHQ-9 Score 2  8    Difficult doing work/chores Not difficult at all  Not difficult at all          08/04/2023    3:37 PM  Fall Risk   Falls in the past year? 0  Number falls in past yr: 0  Injury with Fall? 0  Risk for fall due to : No Fall Risks  Follow up Falls evaluation completed;Education provided;Falls prevention discussed    Patient Care Team: Blane Ohara, MD as PCP - General (Family Medicine) Marlowe Sax, RN as Case Manager (General Practice) Georgeanna Lea, MD as Consulting Physician (Cardiology)   Review of Systems  Constitutional:  Negative for chills, fatigue and fever.  HENT:  Negative for congestion, ear pain, rhinorrhea and sore throat.   Respiratory:  Negative  for cough and shortness of breath.   Cardiovascular:  Negative for chest pain.  Gastrointestinal:  Negative for abdominal pain, constipation, diarrhea, nausea and vomiting.  Genitourinary:  Negative for dysuria and urgency.  Musculoskeletal:  Positive for arthralgias (left knee pain.) and back pain (left sciatica and left leg weakness with ambulation. Resolves after sitting. This has been going on for over the last year.). Negative for myalgias.  Neurological:  Negative for dizziness, weakness, light-headedness and headaches.  Psychiatric/Behavioral:  Negative for dysphoric mood. The patient is not nervous/anxious.     Current Outpatient Medications on File Prior to Visit  Medication Sig Dispense Refill    apixaban (ELIQUIS) 5 MG TABS tablet Take 1 tablet (5 mg total) by mouth 2 (two) times daily. 180 tablet 1   atorvastatin (LIPITOR) 20 MG tablet Take 1 tablet by mouth once daily 90 tablet 0   cetirizine (ZYRTEC) 10 MG tablet Take 10 mg by mouth daily.     diltiazem (CARDIZEM) 30 MG tablet Take 30 mg by mouth every 6 (six) hours as needed (HR).     Empagliflozin-metFORMIN HCl ER (SYNJARDY XR) 12.04-999 MG TB24 Take 1 tablet by mouth in the morning and at bedtime.     EPINEPHrine 0.3 mg/0.3 mL IJ SOAJ injection Inject 0.3 mg into the muscle once.     Finerenone (KERENDIA) 10 MG TABS Take 1 tablet by mouth daily.     flecainide (TAMBOCOR) 50 MG tablet Take 1 tablet (50 mg total) by mouth 2 (two) times daily. 180 tablet 3   glimepiride (AMARYL) 4 MG tablet Take 1 tablet by mouth twice daily 180 tablet 1   icosapent Ethyl (VASCEPA) 1 g capsule Take 2 capsules (2 g total) by mouth 2 (two) times daily. 360 capsule 1   magnesium oxide (MAG-OX) 400 (240 Mg) MG tablet Take 800 mg by mouth 2 (two) times daily.     metoprolol succinate (TOPROL-XL) 50 MG 24 hr tablet Take 1 tablet (50 mg total) by mouth daily. Take with or immediately following a meal. 90 tablet 3   omeprazole (PRILOSEC) 20 MG capsule Take 1 capsule by mouth twice daily 180 capsule 0   ondansetron (ZOFRAN ODT) 4 MG disintegrating tablet Take 1 tablet (4 mg total) by mouth every 8 (eight) hours as needed for nausea or vomiting. 30 tablet 0   promethazine (PHENERGAN) 25 MG tablet Take 1 tablet (25 mg total) by mouth every 8 (eight) hours as needed for nausea or vomiting. 20 tablet 0   Semaglutide, 1 MG/DOSE, 4 MG/3ML SOPN Inject 1 mg as directed once a week. 3 mL 1   No current facility-administered medications on file prior to visit.   Past Medical History:  Diagnosis Date   A-fib [facility])    Angioedema 04/05/2018   GERD (gastroesophageal reflux disease)    History of migraine    Hyperlipidemia    Hypertension    Hypoglycemia due to type 2  diabetes mellitus (HCC) 08/21/2020   Mixed hyperlipidemia    Nontoxic multinodular goiter    Other vitamin B12 deficiency anemias    Primary insomnia 05/20/2020   Solitary pulmonary nodule    Type 2 diabetes mellitus with other specified complication [facility])    Past Surgical History:  Procedure Laterality Date   ABDOMINAL HYSTERECTOMY  06/1993   partial; still has both ovaries   HYSTEROTOMY     partial    Family History  Problem Relation Age of Onset   Coronary artery disease Mother  Glaucoma Mother    Colon cancer Father    Diabetes Sister    Atrial fibrillation Sister    Kidney cancer Brother    Diabetes type II Brother    Diabetes Brother    Prostate cancer Brother    Diabetes Brother    Coronary artery disease Brother    Asthma Son        childhood asthma   Arrhythmia Son    Atrial fibrillation Son    Parkinsonism Son    Eczema Grandchild    Atopy Grandchild    Colon cancer Paternal Uncle    Throat cancer Cousin    Lymphoma Maternal Aunt    Breast cancer Paternal Aunt    Allergic rhinitis Neg Hx    Angioedema Neg Hx    Immunodeficiency Neg Hx    Urticaria Neg Hx    Social History   Socioeconomic History   Marital status: Widowed    Spouse name: Not on file   Number of children: 3   Years of education: Not on file   Highest education level: GED or equivalent  Occupational History   Occupation: RETIRED SEWER  Tobacco Use   Smoking status: Never   Smokeless tobacco: Never  Vaping Use   Vaping status: Never Used  Substance and Sexual Activity   Alcohol use: Never   Drug use: Never   Sexual activity: Not Currently  Other Topics Concern   Not on file  Social History Narrative   Not on file   Social Determinants of Health   Financial Resource Strain: Low Risk  (07/23/2023)   Overall Financial Resource Strain (CARDIA)    Difficulty of Paying Living Expenses: Not very hard  Recent Concern: Financial Resource Strain - Medium Risk (06/02/2023)   Overall  Financial Resource Strain (CARDIA)    Difficulty of Paying Living Expenses: Somewhat hard  Food Insecurity: No Food Insecurity (08/16/2023)   Hunger Vital Sign    Worried About Running Out of Food in the Last Year: Never true    Ran Out of Food in the Last Year: Never true  Transportation Needs: No Transportation Needs (08/16/2023)   PRAPARE - Administrator, Civil Service (Medical): No    Lack of Transportation (Non-Medical): No  Physical Activity: Inactive (07/23/2023)   Exercise Vital Sign    Days of Exercise per Week: 0 days    Minutes of Exercise per Session: 30 min  Stress: No Stress Concern Present (07/23/2023)   [employer] of Occupational Health - Occupational Stress Questionnaire    Feeling of Stress : Not at all  Social Connections: Moderately Isolated (07/23/2023)   Social Connection and Isolation Panel [NHANES]    Frequency of Communication with Friends and Family: More than three times a week    Frequency of Social Gatherings with Friends and Family: Three times a week    Attends Religious Services: More than 4 times per year    Active Member of Clubs or Organizations: No    Attends Banker Meetings: Never    Marital Status: Widowed    Objective:  BP 122/76   Pulse 65   Temp 97.6 F (36.4 C)   Ht 5\' 3"  (1.6 m)   Wt 154 lb (69.9 kg)   LMP  (LMP Unknown)   SpO2 98%   BMI 27.28 kg/m      12/01/2023    8:26 AM 11/16/2023   10:19 AM 08/24/2023   12:02 PM  BP/Weight  Systolic BP  122 138 120  Diastolic BP 76 69 62  Wt. (Lbs) 154 149.1   BMI 27.28 kg/m2 26.41 kg/m2     Physical Exam Vitals reviewed.  Constitutional:      Appearance: Normal appearance. She is normal weight.  Neck:     Vascular: No carotid bruit.  Cardiovascular:     Rate and Rhythm: Normal rate and regular rhythm.     Heart sounds: Normal heart sounds.  Pulmonary:     Effort: Pulmonary effort is normal. No respiratory distress.     Breath sounds: Normal breath  sounds.  Abdominal:     General: Abdomen is flat. Bowel sounds are normal.     Palpations: Abdomen is soft.     Tenderness: There is no abdominal tenderness.  Musculoskeletal:     Lumbar back: Tenderness (left lumbar. neg SLR. FROM of left hip. negative trochanteric bursa.) present.  Neurological:     Mental Status: She is alert and oriented to person, place, and time.  Psychiatric:        Mood and Affect: Mood normal.        Behavior: Behavior normal.     Diabetic Foot Exam - Simple   Simple Foot Form  11/30/2023  7:49 PM  Visual Inspection No deformities, no ulcerations, no other skin breakdown bilaterally: Yes Sensation Testing Intact to touch and monofilament testing bilaterally: Yes Pulse Check Posterior Tibialis and Dorsalis pulse intact bilaterally: Yes Comments      Lab Results  Component Value Date   WBC 9.6 11/16/2023   HGB 14.9 11/16/2023   HCT 43.5 11/16/2023   PLT 245 11/16/2023   GLUCOSE 141 (H) 11/16/2023   CHOL 166 12/01/2023   TRIG 259 (H) 12/01/2023   HDL 42 12/01/2023   LDLCALC 81 12/01/2023   ALT 27 11/16/2023   AST 26 11/16/2023   NA 139 11/16/2023   K 4.4 11/16/2023   CL 100 11/16/2023   CREATININE 0.97 11/16/2023   BUN 19 11/16/2023   CO2 24 11/16/2023   TSH 0.777 01/19/2023   HGBA1C 6.1 (H) 12/01/2023   MICROALBUR 150 10/27/2021      Assessment & Plan:    Hypertension associated with diabetes (HCC) Assessment & Plan: Well controlled.  No changes to medicines. Continue metoprolol 50 mg daily and Lasix 20 mg daily.  Continue Aspirin 81 mg daily. Continue to work on eating a healthy diet and exercise.  Labs drawn today.     GERD without esophagitis Assessment & Plan: The current medical regimen is effective;  continue present plan and medications.  Continue omeprazole 20 mg twice daily.     Diabetic glomerulopathy [facility]) Assessment & Plan: Control: Improved.  Recommend check sugars fasting daily. Recommend check feet  daily. Recommend annual eye exams. Continue your current medications: Glimepiride 4mg  twice daily, Ozempic, [place], and [place]. We will check your A1c today to monitor your long-term blood sugar control. Continue to work on eating a healthy diet and exercise.     Orders: -     Hemoglobin A1c -     Microalbumin / creatinine urine ratio  Mixed hyperlipidemia Assessment & Plan: Not at goal Continue atorvastatin 20 mg daily and vascepa 1 gm 2 capsules twice daily.  Continue to work on eating a healthy diet and exercise.  Labs drawn today.    Orders: -     Lipid panel  Back pain of lumbar region with sciatica Assessment & Plan: Start on Meloxicam 15mg  daily with food for one month  to help with the pain. A lumbar x-ray has been ordered to get a better look at your spine.  Orders: -     Meloxicam; Take 1 tablet (15 mg total) by mouth daily.  Dispense: 30 tablet; Refill: 0 -     DG Lumbar Spine Complete; Future     Meds ordered this encounter  Medications   meloxicam (MOBIC) 15 MG tablet    Sig: Take 1 tablet (15 mg total) by mouth daily.    Dispense:  30 tablet    Refill:  0    Orders Placed This Encounter  Procedures   DG Lumbar Spine Complete   Hemoglobin A1c   Lipid panel   Microalbumin / creatinine urine ratio     Follow-up: Return in about 3 months (around 02/29/2024) for chronic follow up.   I,Marla I Leal-Borjas,acting as a scribe for Blane Ohara, MD.,have documented all relevant documentation on the behalf of Blane Ohara, MD,as directed by  Blane Ohara, MD while in the presence of Blane Ohara, MD.   An After Visit Summary was printed and given to the patient.  Blane Ohara, MD [facility] [phone]

## 2023-12-01 ENCOUNTER — Encounter: Payer: Self-pay | Admitting: Family Medicine

## 2023-12-01 ENCOUNTER — Ambulatory Visit: Payer: PPO | Admitting: Family Medicine

## 2023-12-01 VITALS — BP 122/76 | HR 65 | Temp 97.6°F | Ht 63.0 in | Wt 154.0 lb

## 2023-12-01 DIAGNOSIS — E782 Mixed hyperlipidemia: Secondary | ICD-10-CM | POA: Diagnosis not present

## 2023-12-01 DIAGNOSIS — E1121 Type 2 diabetes mellitus with diabetic nephropathy: Secondary | ICD-10-CM

## 2023-12-01 DIAGNOSIS — I152 Hypertension secondary to endocrine disorders: Secondary | ICD-10-CM

## 2023-12-01 DIAGNOSIS — K219 Gastro-esophageal reflux disease without esophagitis: Secondary | ICD-10-CM

## 2023-12-01 DIAGNOSIS — E1159 Type 2 diabetes mellitus with other circulatory complications: Secondary | ICD-10-CM | POA: Diagnosis not present

## 2023-12-01 DIAGNOSIS — M5440 Lumbago with sciatica, unspecified side: Secondary | ICD-10-CM

## 2023-12-01 MED ORDER — MELOXICAM 15 MG PO TABS: 15.0000 mg | ORAL_TABLET | Freq: Every day | ORAL | 0 refills | Status: DC

## 2023-12-01 NOTE — Patient Instructions (Signed)
VISIT SUMMARY:  During today's visit, we reviewed your diabetes management, addressed your new lower back and leg pain, and discussed your general health maintenance. You reported a previous episode of low blood sugar but have had stable readings recently. We also discussed your new symptoms of lower back pain radiating to your leg, and your history of iron deficiency anemia.  YOUR PLAN:  -DIABETES MELLITUS: Diabetes Mellitus is a condition where your blood sugar levels are too high. You had a previous episode of low blood sugar but your recent levels have been stable. Continue your current medications: Glimepiride 4mg  twice daily, Ozempic, [place], and [place]. We will check your A1c today to monitor your long-term blood sugar control.  -LOWER BACK PAIN WITH RADIATING LEG PAIN: Your lower back pain that radiates to your leg may be due to nerve irritation or arthritis. We will start you on Meloxicam 15mg  daily with food for one month to help with the pain. A lumbar x-ray has been ordered to get a better look at your spine. If there is no improvement, we may consider referring you to physical therapy or orthopedics. Please let us know how you are doing.  -GENERAL HEALTH MAINTENANCE: We will check a urine sample today due to previous protein in your urine. You are advised to get a tetanus vaccine at the pharmacy. We will follow up on your lumbar x-ray results in two weeks.  INSTRUCTIONS:  Please follow up in two weeks to review the results of your lumbar x-ray. Make sure to get your A1c and urine sample checked today. Also, remember to get your tetanus vaccine at the pharmacy.

## 2023-12-02 LAB — LIPID PANEL
Chol/HDL Ratio: 4 {ratio} (ref 0.0–4.4)
Cholesterol, Total: 166 mg/dL (ref 100–199)
HDL: 42 mg/dL (ref >39)
LDL Chol Calc (NIH): 81 mg/dL (ref 0–99)
Triglycerides: 259 mg/dL — ABNORMAL HIGH (ref 0–149)
VLDL Cholesterol Cal: 43 mg/dL — ABNORMAL HIGH (ref 5–40)

## 2023-12-02 LAB — HEMOGLOBIN A1C
Est. average glucose Bld gHb Est-mCnc: 128 mg/dL
Hgb A1c MFr Bld: 6.1 % — ABNORMAL HIGH (ref 4.8–5.6)

## 2023-12-04 NOTE — Assessment & Plan Note (Signed)
Start on Meloxicam 15mg  daily with food for one month to help with the pain. A lumbar x-ray has been ordered to get a better look at your spine.

## 2023-12-04 NOTE — Assessment & Plan Note (Signed)
Control: Improved.  Recommend check sugars fasting daily. Recommend check feet daily. Recommend annual eye exams. Continue your current medications: Glimepiride 4mg  twice daily, Ozempic, [place], and [place]. We will check your A1c today to monitor your long-term blood sugar control. Continue to work on eating a healthy diet and exercise.

## 2023-12-04 NOTE — Assessment & Plan Note (Signed)
Well controlled.  No changes to medicines. Continue metoprolol 50 mg daily and Lasix 20 mg daily.  Continue Aspirin 81 mg daily. Continue to work on eating a healthy diet and exercise.  Labs drawn today.

## 2023-12-04 NOTE — Assessment & Plan Note (Signed)
The current medical regimen is effective;  continue present plan and medications. Continue omeprazole 20 mg twice daily.  

## 2023-12-04 NOTE — Assessment & Plan Note (Signed)
Not at goal Continue atorvastatin 20 mg daily and vascepa 1 gm 2 capsules twice daily.  Continue to work on eating a healthy diet and exercise.  Labs drawn today.

## 2023-12-09 ENCOUNTER — Other Ambulatory Visit: Payer: Self-pay | Admitting: *Deleted

## 2023-12-09 NOTE — Patient Outreach (Signed)
Care Management   Visit Note  12/09/2023 Name: Rachel Hensley MRN: [id number] DOB: 1956-05-14  Subjective: Rachel Hensley is a 67 y.o. year old female who is a primary care patient of Cox, Kirsten, MD. The Care Management team was consulted for assistance.      Engaged with patient spoke with patient by telephone.    Goals Addressed             This Visit's Progress    RNCM Care Management (depression and grief) EXPECTED OUTCOME: MONITOR, SELF-MANAGE AND REDUCE SYMPTOMS OF Depression and Grief       Current Barriers:  Care Coordination needs related to [facility] support and resources in a patient with depression and grief Chronic Disease Management support and education needs related to effective management of depression and grief 10-06-2023- states that her brother is in Hospice care currently, she lost an Uncle on 12/31/23 2023/10/22, death anniversary of husband next week (8 years)  Planned Interventions: Evaluation of current treatment plan related to depression and grief and patient's adherence to plan as established by provider. The patient feels she is doing okay right now with her depression and grief. Reflective listening and support.  Advised patient to call the office for changes in mood, anxiety, depression, or acute changes in mental health Provided education to patient re: about the Grief[facility] and the availability of grief support due to the loss of her husband, mom, brother, and other family members over the last few years. Reminder given of the [facility] program and the resources available. Reviewed scheduled/upcoming provider appointments including 03-02-2024 with PCP Discussed plans with patient for ongoing care management follow up and provided patient with direct contact information for care management team Advised patient to discuss changes in her mental health, depression and grief with provider. Reflective listening and support given Screening for signs and  symptoms of depression related to chronic disease state  Assessed social determinant of health barriers Symptom Management: Attend all scheduled provider appointments Attend church or other social activities Work with the social worker to address care coordination needs and will continue to work with the clinical team to address health care and disease management related needs call the Suicide and Crisis Lifeline: 988 call the [place] National Suicide Prevention Lifeline: 530 420 9603 or TTY: 684-817-5431 TTY 908-305-8894) to talk to a trained counselor call 1-800-273-TALK (toll free, 24 hour hotline) if experiencing a Mental Health or Behavioral Health Crisis   Follow Up Plan: Telephone follow up appointment with care management team member scheduled for: 01-06-2024 at 9:00 am       RNCM Care Management Expected Outcome:  Monitor, Self-Manage and Reduce Symptoms of Afib       Current Barriers:  Knowledge Deficits related to AFIB and recent hospitalization for new onset of AFIB Care Coordination needs related to resources and management of AFIB in a patient with new onset of AFIB with hospitalization for AFIB with [facility] Chronic Disease Management support and education needs related to effective management of AFIB  Planned Interventions: Provider order and care plan reviewed. The patient feels that the current plan for effective management of her Afib is working well for her. She states that her AFIB is stable. She does endorse that last week when she was raking leaves she felt she had an arrythmia/flutter. She states she immediately went in to check her blood pressure and it was normal but states she continued to feel somewhat off for the next day. At present, she states she is doing ok  and denies any changes at this time. Counseled on increased risk of stroke due to Afib and benefits of anticoagulation for stroke prevention. Reviewed importance of adherence to anticoagulant exactly as prescribed.  Reports compliance with Eliquis. Advised patient to discuss changes in her heart rate, changes in her sx and sx, questions and concerns with provider Counseled on bleeding risk associated with AFIB and importance of self-monitoring for signs/symptoms of bleeding Counseled on avoidance of NSAIDs due to increased bleeding risk with anticoagulants Counseled on importance of regular laboratory monitoring as prescribed. Had repeat labs last week for check on Magnesium levels Counseled on seeking medical attention after a head injury or if there is blood in the urine/stool Afib action plan reviewed. The patient is taking Eliquis 5 mg QD.  Screening for signs and symptoms of depression related to chronic disease state Assessed social determinant of health barriers  Symptom Management: Take medications as prescribed   Attend all scheduled provider appointments Call provider office for new concerns or questions  call the Suicide and Crisis Lifeline: 988 call the [place] National Suicide Prevention Lifeline: (262)780-3446 or TTY: 561-712-7676 TTY 539-134-9717) to talk to a trained counselor call 1-800-273-TALK (toll free, 24 hour hotline) if experiencing a Mental Health or Behavioral Health Crisis  - check pulse (heart) rate before taking medicine - make a plan to eat healthy - keep all lab appointments - take medicine as prescribed  Follow Up Plan: Telephone follow up appointment with care management team member scheduled for: 01-06-2024 at 9:00 am       RNCM Care Management Expected Outcome:  Monitor, Self-Manage, and Reduce Symptoms of Hypertension       Current Barriers:  Chronic Disease Management support and education needs related to effective management of HTN Financial Constraints.   BP Readings from Last 3 Encounters:  12/01/23 122/76  11/16/23 138/69  08/24/23 120/62      Planned Interventions: Evaluation of current treatment plan related to hypertension self management and  patient's adherence to plan as established by provider. The patient is compliant with the plan of care. Denies any new concerns with HTN. Does not regularly check blood pressures. RNCM advised to start checking blood pressures on a more regularly basis and keeping a log. Provided education to patient re: stroke prevention, s/s of heart attack and stroke; Reviewed prescribed diet heart healthy/ADA diet. Reviewed medications with patient and discussed importance of compliance. The patient is compliant with medications. Works with Pharm D as needed.  Discussed plans with patient for ongoing care management follow up and provided patient with direct contact information for care management team; Advised patient, providing education and rationale, to monitor blood pressure daily and record, calling PCP for findings outside established parameters;  Advised patient to discuss changes in blood pressure or other heart concerns  with provider;  Discussed complications of poorly controlled blood pressure such as heart disease, stroke, circulatory complications, vision complications, kidney impairment, sexual dysfunction;  Next pcp appointment on 03-02-2024 Symptom Management: Take medications as prescribed   Attend all scheduled provider appointments Call pharmacy for medication refills 3-7 days in advance of running out of medications Call provider office for new concerns or questions  call the Suicide and Crisis Lifeline: 988 call the [place] National Suicide Prevention Lifeline: (952)539-4557 or TTY: 952-654-7593 TTY 320-627-9744) to talk to a trained counselor call 1-800-273-TALK (toll free, 24 hour hotline) if experiencing a Mental Health or Behavioral Health Crisis  check blood pressure weekly learn about high blood pressure call doctor  for signs and symptoms of high blood pressure develop an action plan for high blood pressure keep all doctor appointments take medications for blood pressure exactly as  prescribed report new symptoms to your doctor  Follow Up Plan: Telephone follow up appointment with care management team member scheduled for: 01-06-2024 at 9:00 am       RNCM: Care Management Expected Outcome:  Monitor, Self-Manage and Reduce Symptoms of Diabetes       Current Barriers:  Care Coordination needs related to loss of job and inability to pay for medications in a patient with DM Chronic Disease Management support and education needs related to effective management of DM Financial Constraints- working with pharm D for PAP assistance Difficulty obtaining medications  Lab Results  Component Value Date   HGBA1C 6.1 (H) 12/01/2023    Planned Interventions: Provided education to patient about basic DM disease process. A1C continues to improve. Checks blood sugar fasting daily and reports reading on 12-08-23 of 94. Highest 140 and Lowest 70. She reports that yesterday approximately 2 hours after eating lunch (bologna & cheese sandwich) that she started to feel bad and became shaky and flushed and when she checked her sugar it was 70. She states she ate some candy and started to feel better soon after.  Reviewed medications with patient and discussed importance of medication adherence. Works with PharmD for PAP. Reports compliance with all medications. Reviewed prescribed diet with patient heart healthy/ADA diet.   Counseled on importance of regular laboratory monitoring as prescribed. Has regular lab work ;        Discussed plans with patient for ongoing care management follow up and provided patient with direct contact information for care management team;      Provided patient with written educational materials related to hypo and hyperglycemia and importance of correct treatment.    Reviewed scheduled/upcoming provider appointments including: 03-02-2024 with pcp Advised patient, providing education and rationale, to check cbg when you have symptoms of low or high blood sugar and as  directed   and record. Review of goal of fasting <130 and post prandial of <180     call provider for findings outside established parameters;       Referral made to social work team for assistance with financial barriers, grief and depression. Has worked with the [employer] before. Declines to speak with LCSW at this time. Review of patient status, including review of consultants reports, relevant laboratory and other test results, and medications completed;       Advised patient to discuss changes in her DM health and well being with provider;      Screening for signs and symptoms of depression related to chronic disease state;        Assessed social determinant of health barriers;       Patient to schedule an appointment with the eye doctor after the first of the year.  Symptom Management: Take medications as prescribed   Attend all scheduled provider appointments Call pharmacy for medication refills 3-7 days in advance of running out of medications Call provider office for new concerns or questions  Work with the social worker to address care coordination needs and will continue to work with the clinical team to address health care and disease management related needs call the Suicide and Crisis Lifeline: 988 call the [place] National Suicide Prevention Lifeline: 929-616-9523 or TTY: (808) 785-6240 TTY 325-241-3918) to talk to a trained counselor call 1-800-273-TALK (toll free, 24 hour hotline) if experiencing a Mental  Health or Behavioral Health Crisis  schedule appointment with eye doctor check feet daily for cuts, sores or redness trim toenails straight across fill half of plate with vegetables manage portion size prepare main meal at home 3 to 5 days each week wash and dry feet carefully every day wear comfortable, cotton socks wear comfortable, well-fitting shoes  Follow Up Plan: Telephone follow up appointment with care management team member scheduled for: 01-06-2024 at 9:00 am            Consent to Services:  Patient was given information about care management services, agreed to services, and gave verbal consent to participate.   Plan: Telephone follow up appointment with care management team member scheduled for:01-06-2024 at 9:00 am  Danise Edge, BSN RN RN Care Manager  [facility]  Ambulatory Care Management  Direct Number: [phone]

## 2023-12-09 NOTE — Patient Instructions (Signed)
Visit Information  Thank you for taking time to visit with me today. Please don't hesitate to contact me if I can be of assistance to you before our next scheduled telephone appointment.  Following are the goals we discussed today:   Goals Addressed             This Visit's Progress    RNCM Care Management (depression and grief) EXPECTED OUTCOME: MONITOR, SELF-MANAGE AND REDUCE SYMPTOMS OF Depression and Grief       Current Barriers:  Care Coordination needs related to [facility] support and resources in a patient with depression and grief Chronic Disease Management support and education needs related to effective management of depression and grief 10-06-2023- states that her brother is in Hospice care currently, she lost an Uncle on 01/08/2024 10-30-2023, death anniversary of husband next week (8 years)  Planned Interventions: Evaluation of current treatment plan related to depression and grief and patient's adherence to plan as established by provider. The patient feels she is doing okay right now with her depression and grief. Reflective listening and support.  Advised patient to call the office for changes in mood, anxiety, depression, or acute changes in mental health Provided education to patient re: about the Grief[employer] and the availability of grief support due to the loss of her husband, mom, brother, and other family members over the last few years. Reminder given of the [place] program and the resources available. Reviewed scheduled/upcoming provider appointments including 03-02-2024 with PCP Discussed plans with patient for ongoing care management follow up and provided patient with direct contact information for care management team Advised patient to discuss changes in her mental health, depression and grief with provider. Reflective listening and support given Screening for signs and symptoms of depression related to chronic disease state  Assessed social determinant of health  barriers Symptom Management: Attend all scheduled provider appointments Attend church or other social activities Work with the social worker to address care coordination needs and will continue to work with the clinical team to address health care and disease management related needs call the Suicide and Crisis Lifeline: 988 call the [place] National Suicide Prevention Lifeline: (938)398-2668 or TTY: (781)486-9484 TTY (438)677-3577) to talk to a trained counselor call 1-800-273-TALK (toll free, 24 hour hotline) if experiencing a Mental Health or Behavioral Health Crisis   Follow Up Plan: Telephone follow up appointment with care management team member scheduled for: 01-06-2024 at 9:00 am       RNCM Care Management Expected Outcome:  Monitor, Self-Manage and Reduce Symptoms of Afib       Current Barriers:  Knowledge Deficits related to AFIB and recent hospitalization for new onset of AFIB Care Coordination needs related to resources and management of AFIB in a patient with new onset of AFIB with hospitalization for AFIB with [facility] Chronic Disease Management support and education needs related to effective management of AFIB  Planned Interventions: Provider order and care plan reviewed. The patient feels that the current plan for effective management of her Afib is working well for her. She states that her AFIB is stable. She does endorse that last week when she was raking leaves she felt she had an arrythmia/flutter. She states she immediately went in to check her blood pressure and it was normal but states she continued to feel somewhat off for the next day. At present, she states she is doing ok and denies any changes at this time. Counseled on increased risk of stroke due to Afib and benefits of  anticoagulation for stroke prevention. Reviewed importance of adherence to anticoagulant exactly as prescribed. Reports compliance with Eliquis. Advised patient to discuss changes in her heart rate, changes  in her sx and sx, questions and concerns with provider Counseled on bleeding risk associated with AFIB and importance of self-monitoring for signs/symptoms of bleeding Counseled on avoidance of NSAIDs due to increased bleeding risk with anticoagulants Counseled on importance of regular laboratory monitoring as prescribed. Had repeat labs last week for check on Magnesium levels Counseled on seeking medical attention after a head injury or if there is blood in the urine/stool Afib action plan reviewed. The patient is taking Eliquis 5 mg QD.  Screening for signs and symptoms of depression related to chronic disease state Assessed social determinant of health barriers  Symptom Management: Take medications as prescribed   Attend all scheduled provider appointments Call provider office for new concerns or questions  call the Suicide and Crisis Lifeline: 988 call the [place] National Suicide Prevention Lifeline: 419-462-5536 or TTY: 573-635-9414 TTY 505 377 1850) to talk to a trained counselor call 1-800-273-TALK (toll free, 24 hour hotline) if experiencing a Mental Health or Behavioral Health Crisis  - check pulse (heart) rate before taking medicine - make a plan to eat healthy - keep all lab appointments - take medicine as prescribed  Follow Up Plan: Telephone follow up appointment with care management team member scheduled for: 01-06-2024 at 9:00 am       RNCM Care Management Expected Outcome:  Monitor, Self-Manage, and Reduce Symptoms of Hypertension       Current Barriers:  Chronic Disease Management support and education needs related to effective management of HTN Financial Constraints.   BP Readings from Last 3 Encounters:  12/01/23 122/76  11/16/23 138/69  08/24/23 120/62      Planned Interventions: Evaluation of current treatment plan related to hypertension self management and patient's adherence to plan as established by provider. The patient is compliant with the plan of care.  Denies any new concerns with HTN. Does not regularly check blood pressures. RNCM advised to start checking blood pressures on a more regularly basis and keeping a log. Provided education to patient re: stroke prevention, s/s of heart attack and stroke; Reviewed prescribed diet heart healthy/ADA diet. Reviewed medications with patient and discussed importance of compliance. The patient is compliant with medications. Works with Pharm D as needed.  Discussed plans with patient for ongoing care management follow up and provided patient with direct contact information for care management team; Advised patient, providing education and rationale, to monitor blood pressure daily and record, calling PCP for findings outside established parameters;  Advised patient to discuss changes in blood pressure or other heart concerns  with provider;  Discussed complications of poorly controlled blood pressure such as heart disease, stroke, circulatory complications, vision complications, kidney impairment, sexual dysfunction;  Next pcp appointment on 03-02-2024 Symptom Management: Take medications as prescribed   Attend all scheduled provider appointments Call pharmacy for medication refills 3-7 days in advance of running out of medications Call provider office for new concerns or questions  call the Suicide and Crisis Lifeline: 988 call the [place] National Suicide Prevention Lifeline: 970-520-3794 or TTY: 305-696-0506 TTY (214) 065-2666) to talk to a trained counselor call 1-800-273-TALK (toll free, 24 hour hotline) if experiencing a Mental Health or Behavioral Health Crisis  check blood pressure weekly learn about high blood pressure call doctor for signs and symptoms of high blood pressure develop an action plan for high blood pressure keep all doctor  appointments take medications for blood pressure exactly as prescribed report new symptoms to your doctor  Follow Up Plan: Telephone follow up appointment with  care management team member scheduled for: 01-06-2024 at 9:00 am       RNCM: Care Management Expected Outcome:  Monitor, Self-Manage and Reduce Symptoms of Diabetes       Current Barriers:  Care Coordination needs related to loss of job and inability to pay for medications in a patient with DM Chronic Disease Management support and education needs related to effective management of DM Financial Constraints- working with pharm D for PAP assistance Difficulty obtaining medications  Lab Results  Component Value Date   HGBA1C 6.1 (H) 12/01/2023    Planned Interventions: Provided education to patient about basic DM disease process. A1C continues to improve. Checks blood sugar fasting daily and reports reading on 12-08-23 of 94. Highest 140 and Lowest 70. She reports that yesterday approximately 2 hours after eating lunch (bologna & cheese sandwich) that she started to feel bad and became shaky and flushed and when she checked her sugar it was 70. She states she ate some candy and started to feel better soon after.  Reviewed medications with patient and discussed importance of medication adherence. Works with PharmD for PAP. Reports compliance with all medications. Reviewed prescribed diet with patient heart healthy/ADA diet.   Counseled on importance of regular laboratory monitoring as prescribed. Has regular lab work ;        Discussed plans with patient for ongoing care management follow up and provided patient with direct contact information for care management team;      Provided patient with written educational materials related to hypo and hyperglycemia and importance of correct treatment.    Reviewed scheduled/upcoming provider appointments including: 03-02-2024 with pcp Advised patient, providing education and rationale, to check cbg when you have symptoms of low or high blood sugar and as directed   and record. Review of goal of fasting <130 and post prandial of <180     call provider for  findings outside established parameters;       Referral made to social work team for assistance with financial barriers, grief and depression. Has worked with the [employer] before. Declines to speak with LCSW at this time. Review of patient status, including review of consultants reports, relevant laboratory and other test results, and medications completed;       Advised patient to discuss changes in her DM health and well being with provider;      Screening for signs and symptoms of depression related to chronic disease state;        Assessed social determinant of health barriers;       Patient to schedule an appointment with the eye doctor after the first of the year.  Symptom Management: Take medications as prescribed   Attend all scheduled provider appointments Call pharmacy for medication refills 3-7 days in advance of running out of medications Call provider office for new concerns or questions  Work with the social worker to address care coordination needs and will continue to work with the clinical team to address health care and disease management related needs call the Suicide and Crisis Lifeline: 988 call the [place] National Suicide Prevention Lifeline: 503-743-8545 or TTY: 575-743-8440 TTY (442)883-0006) to talk to a trained counselor call 1-800-273-TALK (toll free, 24 hour hotline) if experiencing a Mental Health or Behavioral Health Crisis  schedule appointment with eye doctor check feet daily for cuts, sores or redness  trim toenails straight across fill half of plate with vegetables manage portion size prepare main meal at home 3 to 5 days each week wash and dry feet carefully every day wear comfortable, cotton socks wear comfortable, well-fitting shoes  Follow Up Plan: Telephone follow up appointment with care management team member scheduled for: 01-06-2024 at 9:00 am           Our next appointment is by telephone on 01-06-2024 at 9:00 am  Please call the care guide team  at [phone] if you need to cancel or reschedule your appointment.   If you are experiencing a Mental Health or Behavioral Health Crisis or need someone to talk to, please call the Suicide and Crisis Lifeline: 988 call the [place] National Suicide Prevention Lifeline: 830-799-8924 or TTY: 5485352776 TTY (458)047-6384) to talk to a trained counselor call 1-800-273-TALK (toll free, 24 hour hotline) call 911   Patient verbalizes understanding of instructions and care plan provided today and agrees to view in MyChart. Active MyChart status and patient understanding of how to access instructions and care plan via MyChart confirmed with patient.     Telephone follow up appointment with care management team member scheduled for:01-06-2024 at 9:00 am  Danise Edge, BSN RN RN Care Manager  [facility]  Ambulatory Care Management  Direct Number: [phone]

## 2023-12-16 NOTE — Telephone Encounter (Signed)
PAP: Application for Rachel Hensley has been submitted to P[employer] Companies: [employer], via fax

## 2024-01-06 ENCOUNTER — Other Ambulatory Visit: Payer: Self-pay | Admitting: *Deleted

## 2024-01-06 ENCOUNTER — Other Ambulatory Visit: Payer: Self-pay | Admitting: Family Medicine

## 2024-01-06 DIAGNOSIS — M5440 Lumbago with sciatica, unspecified side: Secondary | ICD-10-CM

## 2024-01-06 NOTE — Patient Instructions (Signed)
 Visit Information  Thank you for taking time to visit with me today. Please don't hesitate to contact me if I can be of assistance to you before our next scheduled telephone appointment.  Following are the goals we discussed today:   Goals Addressed             This Visit's Progress    RNCM Care Management (depression and grief) EXPECTED OUTCOME: MONITOR, SELF-MANAGE AND REDUCE SYMPTOMS OF Depression and Grief       Current Barriers:  Care Coordination needs related to GriefShare support and resources in a patient with depression and grief Chronic Disease Management support and education needs related to effective management of depression and grief 10-06-2023- states that her brother is in Hospice care currently, she lost an Uncle on 12/07/24 10/07/23, death anniversary of husband next week (8 years)  Planned Interventions: Evaluation of current treatment plan related to depression and grief and patient's adherence to plan as established by provider. The patient feels she is doing okay now but states the holidays were a little rough. Reflective listening and support.  Advised patient to call the office for changes in mood, anxiety, depression, or acute changes in mental health Provided education to patient re: about the GriefShare and the availability of grief support due to the loss of her husband, mom, brother, and other family members over the last few years. Reminder given of the [place] program and the resources available. Reviewed scheduled/upcoming provider appointments including 03-02-2024 with PCP Discussed plans with patient for ongoing care management follow up and provided patient with direct contact information for care management team Advised patient to discuss changes in her mental health, depression and grief with provider. Reflective listening and support given Screening for signs and symptoms of depression related to chronic disease state  Assessed social determinant of  health barriers Symptom Management: Attend all scheduled provider appointments Attend church or other social activities Work with the social worker to address care coordination needs and will continue to work with the clinical team to address health care and disease management related needs call the Suicide and Crisis Lifeline: 988 call the [place]  National Suicide Prevention Lifeline: 512-657-5542 or TTY: 810-278-2553 TTY 339-054-7933) to talk to a trained counselor call 1-800-273-TALK (toll free, 24 hour hotline) if experiencing a Mental Health or Behavioral Health Crisis   Follow Up Plan: Telephone follow up appointment with care management team member scheduled for: 03-06-2024 at 9:00 am       RNCM Care Management Expected Outcome:  Monitor, Self-Manage and Reduce Symptoms of Afib       Current Barriers:  Knowledge Deficits related to AFIB and recent hospitalization for new onset of AFIB Care Coordination needs related to resources and management of AFIB in a patient with new onset of AFIB with hospitalization for AFIB with [facility] Chronic Disease Management support and education needs related to effective management of AFIB  Planned Interventions: Provider order and care plan reviewed. Reports Afib is stable with no changes. Counseled on increased risk of stroke due to Afib and benefits of anticoagulation for stroke prevention. Reviewed importance of adherence to anticoagulant exactly as prescribed. Compliance with Eliquis . Advised patient to discuss changes in her heart rate, changes in her sx and sx, questions and concerns with provider Counseled on bleeding risk associated with AFIB and importance of self-monitoring for signs/symptoms of bleeding Counseled on avoidance of NSAIDs due to increased bleeding risk with anticoagulants Counseled on importance of regular laboratory monitoring as prescribed. Had repeat labs  last week for check on Magnesium levels Counseled on seeking medical  attention after a head injury or if there is blood in the urine/stool Afib action plan reviewed. Screening for signs and symptoms of depression related to chronic disease state Assessed social determinant of health barriers  Symptom Management: Take medications as prescribed   Attend all scheduled provider appointments Call provider office for new concerns or questions  call the Suicide and Crisis Lifeline: 988 call the [place]  National Suicide Prevention Lifeline: (806) 315-4985 or TTY: 365-794-5733 TTY 306-137-9098) to talk to a trained counselor call 1-800-273-TALK (toll free, 24 hour hotline) if experiencing a Mental Health or Behavioral Health Crisis  - check pulse (heart) rate before taking medicine - make a plan to eat healthy - keep all lab appointments - take medicine as prescribed  Follow Up Plan: Telephone follow up appointment with care management team member scheduled for: 03-06-2024 at 9:00 am       RNCM Care Management Expected Outcome:  Monitor, Self-Manage, and Reduce Symptoms of Hypertension       Current Barriers:  Chronic Disease Management support and education needs related to effective management of HTN Financial Constraints.   BP Readings from Last 3 Encounters:  12/01/23 122/76  11/16/23 138/69  08/24/23 120/62      Planned Interventions: Evaluation of current treatment plan related to hypertension self management and patient's adherence to plan as established by provider. Denies any new concerns with HTN. Has been checking blood pressure and reports reading of 115/69 on 01-05-24 Provided education to patient re: stroke prevention, s/s of heart attack and stroke; Reviewed prescribed diet heart healthy/ADA diet. Reviewed medications with patient and discussed importance of compliance. The patient is compliant with medications. Discussed plans with patient for ongoing care management follow up and provided patient with direct contact information for care  management team; Advised patient, providing education and rationale, to monitor blood pressure daily and record, calling PCP for findings outside established parameters;  Advised patient to discuss changes in blood pressure or other heart concerns  with provider;  Discussed complications of poorly controlled blood pressure such as heart disease, stroke, circulatory complications, vision complications, kidney impairment, sexual dysfunction;  Next pcp appointment on 03-02-2024 Symptom Management: Take medications as prescribed   Attend all scheduled provider appointments Call pharmacy for medication refills 3-7 days in advance of running out of medications Call provider office for new concerns or questions  call the Suicide and Crisis Lifeline: 988 call the [place]  National Suicide Prevention Lifeline: 267-855-4742 or TTY: 203-452-7042 TTY (661)763-4380) to talk to a trained counselor call 1-800-273-TALK (toll free, 24 hour hotline) if experiencing a Mental Health or Behavioral Health Crisis  check blood pressure weekly learn about high blood pressure call doctor for signs and symptoms of high blood pressure develop an action plan for high blood pressure keep all doctor appointments take medications for blood pressure exactly as prescribed report new symptoms to your doctor  Follow Up Plan: Telephone follow up appointment with care management team member scheduled for: 03-06-2024 at 9:00 am       RNCM: Care Management Expected Outcome:  Monitor, Self-Manage and Reduce Symptoms of Diabetes       Current Barriers:  Care Coordination needs related to loss of job and inability to pay for medications in a patient with DM Chronic Disease Management support and education needs related to effective management of DM Financial Constraints- working with pharm D for PAP assistance Difficulty obtaining medications  Lab Results  Component  Value Date   HGBA1C 6.1 (H) 12/01/2023    Planned  Interventions: Provided education to patient about basic DM disease process. Checks blood sugar fasting daily and reports reading on 01-05-24 of 87. Highest 147 and Lowest 86. She reports that during the last Sunday of December she had a hypoglycemic event with a reading of 49. She reports that she had eaten breakfast and lunch and it was still low. She states that she held her glimepiride  that evening.  Reviewed medications with patient and discussed importance of medication adherence. Works with PharmD for PAP. Reports compliance with all medications. Reviewed prescribed diet with patient heart healthy/ADA diet.   Counseled on importance of regular laboratory monitoring as prescribed. Has regular lab work ;        Discussed plans with patient for ongoing care management follow up and provided patient with direct contact information for care management team;      Provided patient with written educational materials related to hypo and hyperglycemia and importance of correct treatment.    Reviewed scheduled/upcoming provider appointments including: 03-02-2024 with pcp Advised patient, providing education and rationale, to check cbg when you have symptoms of low or high blood sugar and as directed   and record. Review of goal of fasting <130 and post prandial of <180     call provider for findings outside established parameters;       Referral made to social work team for assistance with financial barriers, grief and depression. Has worked with the LCSW before. Declines to speak with LCSW at this time. Review of patient status, including review of consultants reports, relevant laboratory and other test results, and medications completed;       Advised patient to discuss changes in her DM health and well being with provider;      Screening for signs and symptoms of depression related to chronic disease state;        Assessed social determinant of health barriers;       Patient to schedule an appointment with  the eye doctor after the first of the year.  Symptom Management: Take medications as prescribed   Attend all scheduled provider appointments Call pharmacy for medication refills 3-7 days in advance of running out of medications Call provider office for new concerns or questions  Work with the social worker to address care coordination needs and will continue to work with the clinical team to address health care and disease management related needs call the Suicide and Crisis Lifeline: 988 call the [place]  National Suicide Prevention Lifeline: 201 194 8575 or TTY: 216 640 2264 TTY 586-174-3111) to talk to a trained counselor call 1-800-273-TALK (toll free, 24 hour hotline) if experiencing a Mental Health or Behavioral Health Crisis  schedule appointment with eye doctor check feet daily for cuts, sores or redness trim toenails straight across fill half of plate with vegetables manage portion size prepare main meal at home 3 to 5 days each week wash and dry feet carefully every day wear comfortable, cotton socks wear comfortable, well-fitting shoes  Follow Up Plan: Telephone follow up appointment with care management team member scheduled for: 03-06-2024 at 9:00 am           Our next appointment is by telephone on 03-06-2024 at 9:00 am  Please call the care guide team at [phone] if you need to cancel or reschedule your appointment.   If you are experiencing a Mental Health or Behavioral Health Crisis or need someone to talk to, please call the Suicide  and Crisis Lifeline: 988 call the [place]  National Suicide Prevention Lifeline: (947)863-7549 or TTY: (905)872-3484 TTY 203-242-4267) to talk to a trained counselor call 1-800-273-TALK (toll free, 24 hour hotline) call 911   Patient verbalizes understanding of instructions and care plan provided today and agrees to view in MyChart. Active MyChart status and patient understanding of how to access instructions and care plan via MyChart  confirmed with patient.     Telephone follow up appointment with care management team member scheduled for:03-06-2024 at 9:00 am  Rosina Forte, BSN RN RN Care Manager  [facility]  Ambulatory Care Management  Direct Number: [phone]

## 2024-01-06 NOTE — Patient Outreach (Signed)
 Care Management   Visit Note  01/06/2024 Name: HATLEY HENEGAR MRN: [id number] DOB: [birth year]  Subjective: Rachel Hensley is a 68 y.o. year old female who is a primary care patient of Cox, Kirsten, MD. The Care Management team was consulted for assistance.      Engaged with patient spoke with patient by telephone.    Goals Addressed             This Visit's Progress    RNCM Care Management (depression and grief) EXPECTED OUTCOME: MONITOR, SELF-MANAGE AND REDUCE SYMPTOMS OF Depression and Grief       Current Barriers:  Care Coordination needs related to GriefShare support and resources in a patient with depression and grief Chronic Disease Management support and education needs related to effective management of depression and grief 10-06-2023- states that her brother is in Hospice care currently, she lost an Uncle on 12/05/2024 October 05, 2023, death anniversary of husband next week (8 years)  Planned Interventions: Evaluation of current treatment plan related to depression and grief and patient's adherence to plan as established by provider. The patient feels she is doing okay now but states the holidays were a little rough. Reflective listening and support.  Advised patient to call the office for changes in mood, anxiety, depression, or acute changes in mental health Provided education to patient re: about the GriefShare and the availability of grief support due to the loss of her husband, mom, brother, and other family members over the last few years. Reminder given of the [place] program and the resources available. Reviewed scheduled/upcoming provider appointments including 03-02-2024 with PCP Discussed plans with patient for ongoing care management follow up and provided patient with direct contact information for care management team Advised patient to discuss changes in her mental health, depression and grief with provider. Reflective listening and support given Screening for signs  and symptoms of depression related to chronic disease state  Assessed social determinant of health barriers Symptom Management: Attend all scheduled provider appointments Attend church or other social activities Work with the social worker to address care coordination needs and will continue to work with the clinical team to address health care and disease management related needs call the Suicide and Crisis Lifeline: 988 call the [place]  National Suicide Prevention Lifeline: (854) 738-3299 or TTY: 385-192-3161 TTY 828 757 8728) to talk to a trained counselor call 1-800-273-TALK (toll free, 24 hour hotline) if experiencing a Mental Health or Behavioral Health Crisis   Follow Up Plan: Telephone follow up appointment with care management team member scheduled for: 03-06-2024 at 9:00 am       RNCM Care Management Expected Outcome:  Monitor, Self-Manage and Reduce Symptoms of Afib       Current Barriers:  Knowledge Deficits related to AFIB and recent hospitalization for new onset of AFIB Care Coordination needs related to resources and management of AFIB in a patient with new onset of AFIB with hospitalization for AFIB with [facility] Chronic Disease Management support and education needs related to effective management of AFIB  Planned Interventions: Provider order and care plan reviewed. Reports Afib is stable with no changes. Counseled on increased risk of stroke due to Afib and benefits of anticoagulation for stroke prevention. Reviewed importance of adherence to anticoagulant exactly as prescribed. Compliance with Eliquis . Advised patient to discuss changes in her heart rate, changes in her sx and sx, questions and concerns with provider Counseled on bleeding risk associated with AFIB and importance of self-monitoring for signs/symptoms of bleeding Counseled on avoidance of NSAIDs  due to increased bleeding risk with anticoagulants Counseled on importance of regular laboratory monitoring as  prescribed. Had repeat labs last week for check on Magnesium levels Counseled on seeking medical attention after a head injury or if there is blood in the urine/stool Afib action plan reviewed. Screening for signs and symptoms of depression related to chronic disease state Assessed social determinant of health barriers  Symptom Management: Take medications as prescribed   Attend all scheduled provider appointments Call provider office for new concerns or questions  call the Suicide and Crisis Lifeline: 988 call the [place]  National Suicide Prevention Lifeline: (406) 252-4626 or TTY: 702-645-2469 TTY (437)394-6998) to talk to a trained counselor call 1-800-273-TALK (toll free, 24 hour hotline) if experiencing a Mental Health or Behavioral Health Crisis  - check pulse (heart) rate before taking medicine - make a plan to eat healthy - keep all lab appointments - take medicine as prescribed  Follow Up Plan: Telephone follow up appointment with care management team member scheduled for: 03-06-2024 at 9:00 am       RNCM Care Management Expected Outcome:  Monitor, Self-Manage, and Reduce Symptoms of Hypertension       Current Barriers:  Chronic Disease Management support and education needs related to effective management of HTN Financial Constraints.   BP Readings from Last 3 Encounters:  12/01/23 122/76  11/16/23 138/69  08/24/23 120/62      Planned Interventions: Evaluation of current treatment plan related to hypertension self management and patient's adherence to plan as established by provider. Denies any new concerns with HTN. Has been checking blood pressure and reports reading of 115/69 on 01-05-24 Provided education to patient re: stroke prevention, s/s of heart attack and stroke; Reviewed prescribed diet heart healthy/ADA diet. Reviewed medications with patient and discussed importance of compliance. The patient is compliant with medications. Discussed plans with patient for  ongoing care management follow up and provided patient with direct contact information for care management team; Advised patient, providing education and rationale, to monitor blood pressure daily and record, calling PCP for findings outside established parameters;  Advised patient to discuss changes in blood pressure or other heart concerns  with provider;  Discussed complications of poorly controlled blood pressure such as heart disease, stroke, circulatory complications, vision complications, kidney impairment, sexual dysfunction;  Next pcp appointment on 03-02-2024 Symptom Management: Take medications as prescribed   Attend all scheduled provider appointments Call pharmacy for medication refills 3-7 days in advance of running out of medications Call provider office for new concerns or questions  call the Suicide and Crisis Lifeline: 988 call the [place]  National Suicide Prevention Lifeline: (613) 373-5399 or TTY: 416 759 7910 TTY 618-849-3433) to talk to a trained counselor call 1-800-273-TALK (toll free, 24 hour hotline) if experiencing a Mental Health or Behavioral Health Crisis  check blood pressure weekly learn about high blood pressure call doctor for signs and symptoms of high blood pressure develop an action plan for high blood pressure keep all doctor appointments take medications for blood pressure exactly as prescribed report new symptoms to your doctor  Follow Up Plan: Telephone follow up appointment with care management team member scheduled for: 03-06-2024 at 9:00 am       RNCM: Care Management Expected Outcome:  Monitor, Self-Manage and Reduce Symptoms of Diabetes       Current Barriers:  Care Coordination needs related to loss of job and inability to pay for medications in a patient with DM Chronic Disease Management support and education needs related to effective management  of DM Financial Constraints- working with pharm D for PAP assistance Difficulty obtaining  medications  Lab Results  Component Value Date   HGBA1C 6.1 (H) 12/01/2023    Planned Interventions: Provided education to patient about basic DM disease process. Checks blood sugar fasting daily and reports reading on 01-05-24 of 87. Highest 147 and Lowest 86. She reports that during the last Sunday of December she had a hypoglycemic event with a reading of 49. She reports that she had eaten breakfast and lunch and it was still low. She states that she held her glimepiride  that evening.  Reviewed medications with patient and discussed importance of medication adherence. Works with PharmD for PAP. Reports compliance with all medications. Reviewed prescribed diet with patient heart healthy/ADA diet.   Counseled on importance of regular laboratory monitoring as prescribed. Has regular lab work ;        Discussed plans with patient for ongoing care management follow up and provided patient with direct contact information for care management team;      Provided patient with written educational materials related to hypo and hyperglycemia and importance of correct treatment.    Reviewed scheduled/upcoming provider appointments including: 03-02-2024 with pcp Advised patient, providing education and rationale, to check cbg when you have symptoms of low or high blood sugar and as directed   and record. Review of goal of fasting <130 and post prandial of <180     call provider for findings outside established parameters;       Referral made to social work team for assistance with financial barriers, grief and depression. Has worked with the LCSW before. Declines to speak with LCSW at this time. Review of patient status, including review of consultants reports, relevant laboratory and other test results, and medications completed;       Advised patient to discuss changes in her DM health and well being with provider;      Screening for signs and symptoms of depression related to chronic disease state;         Assessed social determinant of health barriers;       Patient to schedule an appointment with the eye doctor after the first of the year.  Symptom Management: Take medications as prescribed   Attend all scheduled provider appointments Call pharmacy for medication refills 3-7 days in advance of running out of medications Call provider office for new concerns or questions  Work with the social worker to address care coordination needs and will continue to work with the clinical team to address health care and disease management related needs call the Suicide and Crisis Lifeline: 988 call the [place]  National Suicide Prevention Lifeline: 9181999665 or TTY: 571-523-2434 TTY 361-034-0972) to talk to a trained counselor call 1-800-273-TALK (toll free, 24 hour hotline) if experiencing a Mental Health or Behavioral Health Crisis  schedule appointment with eye doctor check feet daily for cuts, sores or redness trim toenails straight across fill half of plate with vegetables manage portion size prepare main meal at home 3 to 5 days each week wash and dry feet carefully every day wear comfortable, cotton socks wear comfortable, well-fitting shoes  Follow Up Plan: Telephone follow up appointment with care management team member scheduled for: 03-06-2024 at 9:00 am            Consent to Services:  Patient was given information about care management services, agreed to services, and gave verbal consent to participate.   Plan: Telephone follow up appointment with care management team  member scheduled for:03-06-2024 at 9:00 am  Rosina Forte, BSN RN RN Care Manager  [facility]  Ambulatory Care Management  Direct Number: [phone]

## 2024-01-11 NOTE — Telephone Encounter (Addendum)
 PAP: Patient assistance application [employer]  for has been approved by PAP Companies: [employer] from 12/29/2023 to 12/27/2024. Medication should be delivered to PAP Delivery: Home For further shipping updates, please contact [employer] at [phone] Pt ID is: per automated system    Spoke with Angelina at [employer], they did not receive the provider portion of application.  Re faxed provider portion of application to [employer].  Also re faxing entire application to [employer] as they do not have application.

## 2024-01-16 ENCOUNTER — Other Ambulatory Visit: Payer: Self-pay | Admitting: Cardiology

## 2024-01-24 ENCOUNTER — Other Ambulatory Visit: Payer: Self-pay | Admitting: Cardiology

## 2024-01-24 NOTE — Telephone Encounter (Signed)
RX sent

## 2024-02-02 ENCOUNTER — Other Ambulatory Visit: Payer: Self-pay

## 2024-02-02 ENCOUNTER — Other Ambulatory Visit: Payer: Self-pay | Admitting: Family Medicine

## 2024-02-02 DIAGNOSIS — M5440 Lumbago with sciatica, unspecified side: Secondary | ICD-10-CM

## 2024-02-02 NOTE — Telephone Encounter (Signed)
 PAP: Patient assistance application for Ozempic  has been approved by PAP Companies: [employer] from 01/22/2024 to 01/21/2025. Medication should be delivered to PAP Delivery: Provider's office. For further shipping updates, please contact [employer] at [phone]. Patient ID is: per automated system

## 2024-02-02 NOTE — Telephone Encounter (Unsigned)
 Copied from CRM [id number]. Topic: Clinical - Prescription Issue >> Feb 02, 2024 11:25 AM Bascom RAMAN wrote: Reason for CRM: Patient needs a prescription faxed to [facility] for Finerenone  (KERENDIA ) 10 MG TABS. Fax number is [phone]. Callback number is [phone]

## 2024-02-09 ENCOUNTER — Other Ambulatory Visit: Payer: Self-pay | Admitting: Family Medicine

## 2024-02-09 MED ORDER — KERENDIA 10 MG PO TABS: 1.0000 | ORAL_TABLET | Freq: Every day | ORAL | 2 refills | Status: DC

## 2024-02-09 NOTE — Telephone Encounter (Signed)
Last Fill: Unknown  Last OV: 12/01/23 Next OV: 03/02/24  Routing to provider for review/authorization.

## 2024-02-09 NOTE — Telephone Encounter (Signed)
Copied from CRM [id number]. Topic: Clinical - Medication Refill >> Feb 09, 2024 10:14 AM Antwanette L wrote: Most Recent Primary Care Visit:  Provider: [facility]  Department: [facility]  Visit Type: OFFICE VISIT  Date: 12/01/2023  Medication: Finerenone (KERENDIA) 10 MG TABS   Has the patient contacted their pharmacy? Yes  (Agent: If no, request that the patient contact the pharmacy for the refill. If patient does not wish to contact the pharmacy document the reason why and proceed with request.) (Agent: If yes, when and what did the pharmacy advise?)  Is this the correct pharmacy for this prescription? No. Patient said she gets medicine through they [facility]. IHas the prescription been filled recently? No  Is the patient out of the medication? No. Patient has 6 days left worth of medicine  Has the patient been seen for an appointment in the last year OR does the patient have an upcoming appointment? Yes  Can we respond through MyChart? No. Please contact patient at [phone]  Agent: Please be advised that Rx refills may take up to 3 business days. We ask that you follow-up with your pharmacy.

## 2024-03-01 NOTE — Progress Notes (Unsigned)
 Subjective:  Patient ID: Rachel Hensley, female    DOB: 1956-04-02  Age: 68 y.o. MRN: [id number]  Chief Complaint  Patient presents with   Medical Management of Chronic Issues   Discussed the use of AI scribe software for clinical note transcription with the patient, who gave verbal consent to proceed.  History of Present Illness        Diabetes:  Complications: nephropathy.  Glucose checking: daily  Glucose logs: 90-133 Hypoglycemia: yes. In the morning they are 70-80 sometimes.  Most recent A1C: 6.1% Current medications:  Glimepiride 4 mg BID, Ozempic 1 mg weekly, Synjardy 1000 mg twice daily.   Last Eye Exam: 08/2023.  Foot checks: daily. Eating healthy.  Not exercising due to knee pain.  Hyperlipidemia: Current medications: Vascepa 1 gm 2 capsules twice a day and atorvastatin 20 mg once daily.    Hypertension: Current medications: metoprolol 50 mg daily,   Atrial fibrillation: flecainide, metoprolol and eliquis.  GERD: omeprazole 20 mg twice daily. Well controlled.   Diet: fairly healthy Exercise: had to stop.      12/01/2023    8:29 AM 08/16/2023    4:19 PM 07/29/2023    8:38 AM 06/02/2023    3:56 PM 04/09/2023    8:20 AM  Depression screen PHQ 2/9  Decreased Interest 0 0 0 0 0  Down, Depressed, Hopeless 0 0 1 0 0  PHQ - 2 Score 0 0 1 0 0  Altered sleeping 1  3    Tired, decreased energy 1  3    Change in appetite 0  1    Feeling bad or failure about yourself  0  0    Trouble concentrating 0  0    Moving slowly or fidgety/restless 0  0    Suicidal thoughts 0  0    PHQ-9 Score 2  8    Difficult doing work/chores Not difficult at all  Not difficult at all          08/04/2023    3:37 PM  Fall Risk   Falls in the past year? 0  Number falls in past yr: 0  Injury with Fall? 0  Risk for fall due to : No Fall Risks  Follow up Falls evaluation completed;Education provided;Falls prevention discussed    Patient Care Team: Blane Ohara, MD as PCP - General  (Family Medicine) Georgeanna Lea, MD as Consulting Physician (Cardiology) Ricky Stabs, RN as VBCI Care Management (General Practice)   Review of Systems  Constitutional:  Negative for chills, fatigue and fever.  HENT:  Positive for congestion. Negative for ear pain, rhinorrhea and sore throat.   Respiratory:  Negative for cough and shortness of breath.   Cardiovascular:  Negative for chest pain.  Gastrointestinal:  Positive for diarrhea. Negative for abdominal pain, constipation, nausea and vomiting.  Genitourinary:  Negative for dysuria and urgency.  Musculoskeletal:  Positive for back pain. Negative for myalgias.  Neurological:  Negative for dizziness, weakness and headaches.  Psychiatric/Behavioral:  Negative for dysphoric mood. The patient is not nervous/anxious.     Current Outpatient Medications on File Prior to Visit  Medication Sig Dispense Refill   apixaban (ELIQUIS) 5 MG TABS tablet Take 1 tablet (5 mg total) by mouth 2 (two) times daily. 180 tablet 1   atorvastatin (LIPITOR) 20 MG tablet Take 1 tablet by mouth once daily 90 tablet 0   cetirizine (ZYRTEC) 10 MG tablet Take 10 mg by mouth daily.  diltiazem (CARDIZEM) 30 MG tablet Take 30 mg by mouth every 6 (six) hours as needed (HR).     Empagliflozin-metFORMIN HCl ER (SYNJARDY XR) 12.04-999 MG TB24 Take 1 tablet by mouth daily.     EPINEPHrine 0.3 mg/0.3 mL IJ SOAJ injection Inject 0.3 mg into the muscle once.     flecainide (TAMBOCOR) 50 MG tablet Take 1 tablet (50 mg total) by mouth 2 (two) times daily. 180 tablet 1   glimepiride (AMARYL) 4 MG tablet Take 1 tablet by mouth twice daily 180 tablet 1   icosapent Ethyl (VASCEPA) 1 g capsule Take 2 capsules (2 g total) by mouth 2 (two) times daily. 360 capsule 1   magnesium oxide (MAG-OX) 400 (240 Mg) MG tablet Take 800 mg by mouth 2 (two) times daily.     metoprolol succinate (TOPROL-XL) 50 MG 24 hr tablet TAKE 1 TABLET BY MOUTH ONCE DAILY.  TAKE WITH OR IMMEDIATELY  FOLLOWING A MEAL 90 tablet 2   omeprazole (PRILOSEC) 20 MG capsule Take 1 capsule by mouth twice daily 180 capsule 0   ondansetron (ZOFRAN ODT) 4 MG disintegrating tablet Take 1 tablet (4 mg total) by mouth every 8 (eight) hours as needed for nausea or vomiting. 30 tablet 0   promethazine (PHENERGAN) 25 MG tablet Take 1 tablet (25 mg total) by mouth every 8 (eight) hours as needed for nausea or vomiting. 20 tablet 0   Semaglutide, 1 MG/DOSE, 4 MG/3ML SOPN Inject 1 mg as directed once a week. 3 mL 1   No current facility-administered medications on file prior to visit.   Past Medical History:  Diagnosis Date   A-fib [facility])    Angioedema 04/05/2018   GERD (gastroesophageal reflux disease)    History of migraine    Hyperlipidemia    Hypertension    Hypoglycemia due to type 2 diabetes mellitus (HCC) 08/21/2020   Mixed hyperlipidemia    Nontoxic multinodular goiter    Other vitamin B12 deficiency anemias    Primary insomnia 05/20/2020   Solitary pulmonary nodule    Type 2 diabetes mellitus with other specified complication [facility])    Past Surgical History:  Procedure Laterality Date   ABDOMINAL HYSTERECTOMY  06/1993   partial; still has both ovaries   HYSTEROTOMY     partial    Family History  Problem Relation Age of Onset   Coronary artery disease Mother    Glaucoma Mother    Colon cancer Father    Diabetes Sister    Atrial fibrillation Sister    Kidney cancer Brother    Diabetes type II Brother    Diabetes Brother    Prostate cancer Brother    Diabetes Brother    Coronary artery disease Brother    Asthma Son        childhood asthma   Arrhythmia Son    Atrial fibrillation Son    Parkinsonism Son    Eczema Grandchild    Atopy Grandchild    Colon cancer Paternal Uncle    Throat cancer Cousin    Lymphoma Maternal Aunt    Breast cancer Paternal Aunt    Allergic rhinitis Neg Hx    Angioedema Neg Hx    Immunodeficiency Neg Hx    Urticaria Neg Hx    Social History    Socioeconomic History   Marital status: Widowed    Spouse name: Not on file   Number of children: 3   Years of education: Not on file   Highest education level:  GED or equivalent  Occupational History   Occupation: RETIRED SEWER  Tobacco Use   Smoking status: Never   Smokeless tobacco: Never  Vaping Use   Vaping status: Never Used  Substance and Sexual Activity   Alcohol use: Never   Drug use: Never   Sexual activity: Not Currently  Other Topics Concern   Not on file  Social History Narrative   Not on file   Social Drivers of Health   Financial Resource Strain: Low Risk  (07/23/2023)   Overall Financial Resource Strain (CARDIA)    Difficulty of Paying Living Expenses: Not very hard  Recent Concern: Financial Resource Strain - Medium Risk (06/02/2023)   Overall Financial Resource Strain (CARDIA)    Difficulty of Paying Living Expenses: Somewhat hard  Food Insecurity: No Food Insecurity (03/02/2024)   Hunger Vital Sign    Worried About Running Out of Food in the Last Year: Never true    Ran Out of Food in the Last Year: Never true  Transportation Needs: No Transportation Needs (03/02/2024)   PRAPARE - Administrator, Civil Service (Medical): No    Lack of Transportation (Non-Medical): No  Physical Activity: Inactive (07/23/2023)   Exercise Vital Sign    Days of Exercise per Week: 0 days    Minutes of Exercise per Session: 30 min  Stress: No Stress Concern Present (07/23/2023)   [employer] of Occupational Health - Occupational Stress Questionnaire    Feeling of Stress : Not at all  Social Connections: Moderately Isolated (07/23/2023)   Social Connection and Isolation Panel [NHANES]    Frequency of Communication with Friends and Family: More than three times a week    Frequency of Social Gatherings with Friends and Family: Three times a week    Attends Religious Services: More than 4 times per year    Active Member of Clubs or Organizations: No    Attends  Banker Meetings: Never    Marital Status: Widowed    Objective:  BP 130/64   Pulse 72   Temp 98.6 F (37 C)   Ht 5\' 3"  (1.6 m)   Wt 151 lb (68.5 kg)   LMP  (LMP Unknown)   SpO2 98%   BMI 26.75 kg/m      03/02/2024    8:31 AM 12/01/2023    8:26 AM 11/16/2023   10:19 AM  BP/Weight  Systolic BP 130 122 138  Diastolic BP 64 76 69  Wt. (Lbs) 151 154 149.1  BMI 26.75 kg/m2 27.28 kg/m2 26.41 kg/m2    Physical Exam Vitals reviewed.  Constitutional:      Appearance: Normal appearance. She is normal weight.  HENT:     Right Ear: Tympanic membrane normal.     Left Ear: Tympanic membrane normal.     Nose: Congestion present.  Neck:     Vascular: No carotid bruit.  Cardiovascular:     Rate and Rhythm: Normal rate and regular rhythm.     Heart sounds: Normal heart sounds.  Pulmonary:     Effort: Pulmonary effort is normal. No respiratory distress.     Breath sounds: Normal breath sounds.  Abdominal:     General: Abdomen is flat. Bowel sounds are normal.     Palpations: Abdomen is soft.     Tenderness: There is no abdominal tenderness.  Neurological:     Mental Status: She is alert and oriented to person, place, and time.  Psychiatric:  Mood and Affect: Mood normal.        Behavior: Behavior normal.     Diabetic Foot Exam - Simple   No data filed      Lab Results  Component Value Date   WBC 8.6 03/02/2024   HGB 14.9 03/02/2024   HCT 43.5 03/02/2024   PLT 263 03/02/2024   GLUCOSE 106 (H) 03/02/2024   CHOL 130 03/02/2024   TRIG 172 (H) 03/02/2024   HDL 41 03/02/2024   LDLCALC 60 03/02/2024   ALT 22 03/02/2024   AST 20 03/02/2024   NA 145 (H) 03/02/2024   K 4.7 03/02/2024   CL 103 03/02/2024   CREATININE 0.83 03/02/2024   BUN 24 03/02/2024   CO2 22 03/02/2024   TSH 0.777 01/19/2023   HGBA1C 6.0 (H) 03/02/2024   MICROALBUR 150 10/27/2021      Assessment & Plan:  Assessment and Plan       Hypertension associated with diabetes  (HCC) Assessment & Plan: Well controlled.  No changes to medicines. Continue metoprolol 50 mg daily and Lasix 20 mg daily.  Continue Aspirin 81 mg daily. Continue to work on eating a healthy diet and exercise.  Labs drawn today.    Orders: -     CBC with Differential/Platelet -     Comprehensive metabolic panel  Paroxysmal atrial fibrillation [facility]) Assessment & Plan: Continue current medications. Continue flecainide, metoprolol and eliquis.    GERD without esophagitis Assessment & Plan: The current medical regimen is effective;  continue present plan and medications.  Continue omeprazole 20 mg twice daily.     Diabetic glomerulopathy [facility]) Assessment & Plan: We have adjusted your medications to help manage your blood sugar levels and reduce the risk of hypoglycemia. Please reduce Synjardy to once daily in the morning, continue Ozempic at 1 mg weekly, and take glimepiride twice daily unless sugars less than 100 mg/dl in am and then hold am dose of glimeperide and just take glimeperide before your largest meal.  Monitor your blood glucose before breakfast and supper and keep a log of your readings. We will follow up in six weeks.   Orders: -     Hemoglobin A1c -     Microalbumin / creatinine urine ratio  Mixed hyperlipidemia Assessment & Plan: Not at goal Continue atorvastatin 20 mg daily and vascepa 1 gm 2 capsules twice daily.  Continue to work on eating a healthy diet and exercise.  Labs drawn today.    Orders: -     Lipid panel  Back pain of lumbar region with sciatica Assessment & Plan: Referring to physical therapy.  Orders: -     Ambulatory referral to Physical Therapy  Diarrhea, unspecified type     No orders of the defined types were placed in this encounter.   Orders Placed This Encounter  Procedures   CBC with Differential/Platelet   Comprehensive metabolic panel   Hemoglobin A1c   Lipid panel   Microalbumin / creatinine urine ratio    Ambulatory referral to Physical Therapy     Follow-up: Return in about 6 weeks (around 04/13/2024) for chronic follow up and 3 months follow up fasting. Clayborn Bigness I Leal-Borjas,acting as a scribe for Blane Ohara, MD.,have documented all relevant documentation on the behalf of Blane Ohara, MD,as directed by  Blane Ohara, MD while in the presence of Blane Ohara, MD.   An After Visit Summary was printed and given to the patient.  Blane Ohara, MD [facility] [phone]

## 2024-03-02 ENCOUNTER — Telehealth: Payer: Self-pay

## 2024-03-02 ENCOUNTER — Ambulatory Visit (INDEPENDENT_AMBULATORY_CARE_PROVIDER_SITE_OTHER): Payer: PPO | Admitting: Family Medicine

## 2024-03-02 ENCOUNTER — Other Ambulatory Visit: Payer: Self-pay

## 2024-03-02 VITALS — BP 130/64 | HR 72 | Temp 98.6°F | Ht 63.0 in | Wt 151.0 lb

## 2024-03-02 DIAGNOSIS — E782 Mixed hyperlipidemia: Secondary | ICD-10-CM

## 2024-03-02 DIAGNOSIS — R197 Diarrhea, unspecified: Secondary | ICD-10-CM

## 2024-03-02 DIAGNOSIS — E1121 Type 2 diabetes mellitus with diabetic nephropathy: Secondary | ICD-10-CM

## 2024-03-02 DIAGNOSIS — K219 Gastro-esophageal reflux disease without esophagitis: Secondary | ICD-10-CM

## 2024-03-02 DIAGNOSIS — I480 Paroxysmal atrial fibrillation: Secondary | ICD-10-CM | POA: Diagnosis not present

## 2024-03-02 DIAGNOSIS — I10 Essential (primary) hypertension: Secondary | ICD-10-CM

## 2024-03-02 DIAGNOSIS — I152 Hypertension secondary to endocrine disorders: Secondary | ICD-10-CM

## 2024-03-02 DIAGNOSIS — M5440 Lumbago with sciatica, unspecified side: Secondary | ICD-10-CM

## 2024-03-02 DIAGNOSIS — J301 Allergic rhinitis due to pollen: Secondary | ICD-10-CM

## 2024-03-02 NOTE — Telephone Encounter (Signed)
 Patient stated she needed a refill on meloxicam if Dr. Sedalia Muta was going to allow her to continue taking the medication, left message informing patient that per Dr. Sedalia Muta, "Please let her know that I do not want her to keep taking the meloxicam because she is on eliquis. Thanks. recommend tylenol for her back pain."

## 2024-03-02 NOTE — Patient Instructions (Signed)
 VISIT SUMMARY:  During today's visit, we discussed several of your ongoing health concerns, including your diabetes management, chronic diarrhea, allergy symptoms, and leg numbness and weakness. We have made some adjustments to your medications and provided recommendations to help manage your symptoms more effectively.  YOUR PLAN:  -TYPE 2 DIABETES MELLITUS: Type 2 diabetes is a condition where your body does not use insulin properly, leading to high blood sugar levels. We have adjusted your medications to help manage your blood sugar levels and reduce the risk of hypoglycemia. Please reduce Synjardy to once daily in the morning, continue Ozempic at 1 mg weekly, and take glimepiride twice daily unless sugars less than 100 mg/dl in am and then hold am dose of glimeperide and just take glimeperide before your largest meal.  Monitor your blood glucose before breakfast and supper and keep a log of your readings. We will follow up in six weeks.  -CHRONIC DIARRHEA: Chronic diarrhea is frequent, loose, or watery bowel movements. This may be due to the metformin in [place]. We have reduced Synjardy to once daily to decrease your exposure to metformin. Please monitor for any changes in your diarrhea symptoms.  -ALLERGIC RHINITIS: Allergic rhinitis is inflammation of the nasal passages caused by allergies. We recommend that you stop using Afrin nasal spray for more than three days to avoid rebound congestion. Instead, use nasal saline spray for nasal dryness and soreness. We have provided a sample of AYR nasal gel for your nasal sore.  -CHRONIC PAIN AND NUMBNESS IN LEG: The numbness and weakness in your leg may be due to a sciatic nerve issue. We are referring you to physical therapy at [facility]. If physical therapy does not improve your symptoms, we may consider an MRI.  -GENERAL HEALTH MAINTENANCE: You are managing multiple chronic conditions with your current medications. Continue taking your  medications for atrial fibrillation, hypertension, and high cholesterol. We will address insurance coverage for Chauncey Mann if needed.  INSTRUCTIONS:  Please follow up in six weeks for your diabetes management. Monitor your blood glucose levels before breakfast and supper and keep a log of your readings. If your leg symptoms do not improve with physical therapy, we may consider an MRI. Continue taking your current medications for atrial fibrillation, hypertension, and high cholesterol.   For more information, you can read your full clinical note, available in your patient portal.

## 2024-03-03 LAB — COMPREHENSIVE METABOLIC PANEL
ALT: 22 IU/L (ref 0–32)
AST: 20 IU/L (ref 0–40)
Albumin: 4.3 g/dL (ref 3.9–4.9)
Alkaline Phosphatase: 91 IU/L (ref 44–121)
BUN/Creatinine Ratio: 29 — ABNORMAL HIGH (ref 12–28)
BUN: 24 mg/dL (ref 8–27)
Bilirubin Total: 0.4 mg/dL (ref 0.0–1.2)
CO2: 22 mmol/L (ref 20–29)
Calcium: 9.9 mg/dL (ref 8.7–10.3)
Chloride: 103 mmol/L (ref 96–106)
Creatinine, Ser: 0.83 mg/dL (ref 0.57–1.00)
Globulin, Total: 2.7 g/dL (ref 1.5–4.5)
Glucose: 106 mg/dL — ABNORMAL HIGH (ref 70–99)
Potassium: 4.7 mmol/L (ref 3.5–5.2)
Sodium: 145 mmol/L — ABNORMAL HIGH (ref 134–144)
Total Protein: 7 g/dL (ref 6.0–8.5)
eGFR: 76 mL/min/{1.73_m2} (ref >59)

## 2024-03-03 LAB — HEMOGLOBIN A1C
Est. average glucose Bld gHb Est-mCnc: 126 mg/dL
Hgb A1c MFr Bld: 6 % — ABNORMAL HIGH (ref 4.8–5.6)

## 2024-03-03 LAB — CBC WITH DIFFERENTIAL/PLATELET
Basophils Absolute: 0 10*3/uL (ref 0.0–0.2)
Basos: 0 %
EOS (ABSOLUTE): 0.3 10*3/uL (ref 0.0–0.4)
Eos: 3 %
Hematocrit: 43.5 % (ref 34.0–46.6)
Hemoglobin: 14.9 g/dL (ref 11.1–15.9)
Immature Grans (Abs): 0 10*3/uL (ref 0.0–0.1)
Immature Granulocytes: 1 %
Lymphocytes Absolute: 2.8 10*3/uL (ref 0.7–3.1)
Lymphs: 32 %
MCH: 31.8 pg (ref 26.6–33.0)
MCHC: 34.3 g/dL (ref 31.5–35.7)
MCV: 93 fL (ref 79–97)
Monocytes Absolute: 0.7 10*3/uL (ref 0.1–0.9)
Monocytes: 8 %
Neutrophils Absolute: 4.7 10*3/uL (ref 1.4–7.0)
Neutrophils: 56 %
Platelets: 263 10*3/uL (ref 150–450)
RBC: 4.68 x10E6/uL (ref 3.77–5.28)
RDW: 13 % (ref 11.7–15.4)
WBC: 8.6 10*3/uL (ref 3.4–10.8)

## 2024-03-03 LAB — MICROALBUMIN / CREATININE URINE RATIO
Creatinine, Urine: 109.9 mg/dL
Microalb/Creat Ratio: 74 mg/g{creat} — ABNORMAL HIGH (ref 0–29)
Microalbumin, Urine: 81.3 ug/mL

## 2024-03-03 LAB — LIPID PANEL
Chol/HDL Ratio: 3.2 ratio (ref 0.0–4.4)
Cholesterol, Total: 130 mg/dL (ref 100–199)
HDL: 41 mg/dL (ref >39)
LDL Chol Calc (NIH): 60 mg/dL (ref 0–99)
Triglycerides: 172 mg/dL — ABNORMAL HIGH (ref 0–149)
VLDL Cholesterol Cal: 29 mg/dL (ref 5–40)

## 2024-03-04 ENCOUNTER — Encounter: Payer: Self-pay | Admitting: Family Medicine

## 2024-03-04 NOTE — Assessment & Plan Note (Signed)
 Well controlled.  No changes to medicines. Continue metoprolol 50 mg daily. Continue Aspirin 81 mg daily. Continue to work on eating a healthy diet and exercise.  Labs drawn today.

## 2024-03-04 NOTE — Assessment & Plan Note (Signed)
 Referring to physical therapy. If no improvement, recommend mri.

## 2024-03-04 NOTE — Assessment & Plan Note (Signed)
The current medical regimen is effective;  continue present plan and medications. Continue omeprazole 20 mg twice daily.  

## 2024-03-04 NOTE — Assessment & Plan Note (Signed)
Not at goal Continue atorvastatin 20 mg daily and vascepa 1 gm 2 capsules twice daily.  Continue to work on eating a healthy diet and exercise.  Labs drawn today.

## 2024-03-04 NOTE — Assessment & Plan Note (Signed)
 We have adjusted your medications to help manage your blood sugar levels and reduce the risk of hypoglycemia. Please reduce Synjardy to once daily in the morning, continue Ozempic at 1 mg weekly, and take glimepiride twice daily unless sugars less than 100 mg/dl in am and then hold am dose of glimeperide and just take glimeperide before your largest meal.  Monitor your blood glucose before breakfast and supper and keep a log of your readings. We will follow up in six weeks.

## 2024-03-04 NOTE — Assessment & Plan Note (Signed)
Continue current medications. Continue flecainide, metoprolol and eliquis.

## 2024-03-05 ENCOUNTER — Encounter: Payer: Self-pay | Admitting: Family Medicine

## 2024-03-05 DIAGNOSIS — J301 Allergic rhinitis due to pollen: Secondary | ICD-10-CM

## 2024-03-05 HISTORY — DX: Allergic rhinitis due to pollen: J30.1

## 2024-03-05 NOTE — Assessment & Plan Note (Signed)
 Symptoms consistent with allergies. Advised against prolonged Afrin use due to rebound congestion. - Advise against using Afrin nasal spray for more than three days. - Recommend nasal saline spray for nasal dryness and sore. - Provide sample of AYR nasal gel for nasal sore.

## 2024-03-05 NOTE — Assessment & Plan Note (Signed)
 Chronic loose stools possibly due to metformin in [place]. Discussed reducing Synjardy. - Reduce Synjardy to once daily to decrease metformin exposure. - Monitor for changes in diarrhea symptoms.

## 2024-03-06 ENCOUNTER — Other Ambulatory Visit: Payer: Self-pay | Admitting: *Deleted

## 2024-03-06 NOTE — Patient Outreach (Signed)
 Care Management   Visit Note  03/06/2024 Name: Rachel Hensley MRN: [id number] DOB: [birth year]  Subjective: Rachel Hensley is a 68 y.o. year old female who is a primary care patient of Cox, Kirsten, MD. The Care Management team was consulted for assistance.      Engaged with patient spoke with patient by telephone.    Goals Addressed             This Visit's Progress    RNCM Care Management (depression and grief) EXPECTED OUTCOME: MONITOR, SELF-MANAGE AND REDUCE SYMPTOMS OF Depression and Grief       Current Barriers:  Care Coordination needs related to [facility]rief[facility] support and resources in a patient with depression and grief Chronic Disease Management support and education needs related to effective management of depression and grief 10-06-2023- states that her brother is in Hospice care currently, she lost an Uncle on 15-Mar-2024 10-06-2023, death anniversary of husband next week (8 years)  Planned Interventions: Evaluation of current treatment plan related to depression and grief and patient's adherence to plan as established by provider. Patient states she is doing well.Reflective listening and support.  Advised patient to call the office for changes in mood, anxiety, depression, or acute changes in mental health Provided education to patient re: about the Grief[employer] and the availability of grief support due to the loss of her husband, mom, brother, and other family members over the last few years. Reminder given of the [place] program and the resources available. Reviewed scheduled/upcoming provider appointments including 04-13-2024 with PCP Discussed plans with patient for ongoing care management follow up and provided patient with direct contact information for care management team Advised patient to discuss changes in her mental health, depression and grief with provider. Reflective listening and support given Screening for signs and symptoms of depression related to chronic  disease state  Assessed social determinant of health barriers Symptom Management: Attend all scheduled provider appointments Attend church or other social activities Work with the social worker to address care coordination needs and will continue to work with the clinical team to address health care and disease management related needs call the Suicide and Crisis Lifeline: 988 call the [place] National Suicide Prevention Lifeline: 770-469-7075 or TTY: 757-463-0336 TTY 269 410 3298) to talk to a trained counselor call 1-800-273-TALK (toll free, 24 hour hotline) if experiencing a Mental Health or Behavioral Health Crisis   Follow Up Plan: Telephone follow up appointment with care management team member scheduled for: 04-03-2024 at 2:30 pm       RNCM Care Management Expected Outcome:  Monitor, Self-Manage and Reduce Symptoms of Afib       Current Barriers:  Knowledge Deficits related to AFIB and recent hospitalization for new onset of AFIB Care Coordination needs related to resources and management of AFIB in a patient with new onset of AFIB with hospitalization for AFIB with [facility] Chronic Disease Management support and education needs related to effective management of AFIB  Planned Interventions: Provider order and care plan reviewed. Reports Afib is stable with no changes. Counseled on increased risk of stroke due to Afib and benefits of anticoagulation for stroke prevention. Reviewed importance of adherence to anticoagulant exactly as prescribed. Compliance with Eliquis. Advised patient to discuss changes in her heart rate, changes in her sx and sx, questions and concerns with provider Counseled on bleeding risk associated with AFIB and importance of self-monitoring for signs/symptoms of bleeding Counseled on avoidance of NSAIDs due to increased bleeding risk with anticoagulants Counseled on importance of  regular laboratory monitoring as prescribed. Had repeat labs last week for check on  Magnesium levels Counseled on seeking medical attention after a head injury or if there is blood in the urine/stool Afib action plan reviewed. Screening for signs and symptoms of depression related to chronic disease state Assessed social determinant of health barriers  Symptom Management: Take medications as prescribed   Attend all scheduled provider appointments Call provider office for new concerns or questions  call the Suicide and Crisis Lifeline: 988 call the [place] National Suicide Prevention Lifeline: (340)848-5195 or TTY: (405)337-1440 TTY 418-396-6962) to talk to a trained counselor call 1-800-273-TALK (toll free, 24 hour hotline) if experiencing a Mental Health or Behavioral Health Crisis  - check pulse (heart) rate before taking medicine - make a plan to eat healthy - keep all lab appointments - take medicine as prescribed  Follow Up Plan: Telephone follow up appointment with care management team member scheduled for: 04-03-2024 at 2:30 pm       RNCM Care Management Expected Outcome:  Monitor, Self-Manage, and Reduce Symptoms of Hypertension       Current Barriers:  Chronic Disease Management support and education needs related to effective management of HTN Financial Constraints.   BP Readings from Last 3 Encounters:  03/02/24 130/64  12/01/23 122/76  11/16/23 138/69      Planned Interventions: Evaluation of current treatment plan related to hypertension self management and patient's adherence to plan as established by provider. Denies any new concerns with HTN. Reports stable BP. Provided education to patient re: stroke prevention, s/s of heart attack and stroke; Education and support Reviewed prescribed diet heart healthy/ADA diet. Reviewed medications with patient and discussed importance of compliance. The patient is compliant with medications. Discussed plans with patient for ongoing care management follow up and provided patient with direct contact information for  care management team; Advised patient, providing education and rationale, to monitor blood pressure daily and record, calling PCP for findings outside established parameters;  Advised patient to discuss changes in blood pressure or other heart concerns  with provider;  Discussed complications of poorly controlled blood pressure such as heart disease, stroke, circulatory complications, vision complications, kidney impairment, sexual dysfunction;  Next pcp appointment on 04-13-2024 Symptom Management: Take medications as prescribed   Attend all scheduled provider appointments Call pharmacy for medication refills 3-7 days in advance of running out of medications Call provider office for new concerns or questions  call the Suicide and Crisis Lifeline: 988 call the [place] National Suicide Prevention Lifeline: 6717987097 or TTY: 762-531-4832 TTY (828) 423-5478) to talk to a trained counselor call 1-800-273-TALK (toll free, 24 hour hotline) if experiencing a Mental Health or Behavioral Health Crisis  check blood pressure weekly learn about high blood pressure call doctor for signs and symptoms of high blood pressure develop an action plan for high blood pressure keep all doctor appointments take medications for blood pressure exactly as prescribed report new symptoms to your doctor  Follow Up Plan: Telephone follow up appointment with care management team member scheduled for: 04-03-2024 at 2:30 pm       RNCM: Care Management Expected Outcome:  Monitor, Self-Manage and Reduce Symptoms of Diabetes       Current Barriers:  Care Coordination needs related to loss of job and inability to pay for medications in a patient with DM Chronic Disease Management support and education needs related to effective management of DM Financial Constraints- working with pharm D for PAP assistance Difficulty obtaining medications  Lab Results  Component Value Date   HGBA1C 6.0 (H) 03/02/2024    Planned  Interventions: Provided education to patient about basic DM disease process. A1C now at goal. Checks blood sugar fasting 61 and is correcting it with juice this morning.  RNCM discussed having bed time snack to help level out blood sugars. Reviewed medications with patient and discussed importance of medication adherence. Works with PharmD for PAP. Reports compliance with all medications. Reviewed prescribed diet with patient heart healthy/ADA diet.   Counseled on importance of regular laboratory monitoring as prescribed. Has regular lab work ;        Discussed plans with patient for ongoing care management follow up and provided patient with direct contact information for care management team;      Provided patient with written educational materials related to hypo and hyperglycemia and importance of correct treatment.    Reviewed scheduled/upcoming provider appointments including: 04-13-2024 with pcp Advised patient, providing education and rationale, to check cbg when you have symptoms of low or high blood sugar and as directed   and record. Review of goal of fasting <130 and post prandial of <180     call provider for findings outside established parameters;       Referral made to social work team for assistance with financial barriers, grief and depression. Has worked with the [employer] before. Declines to speak with LCSW at this time. Review of patient status, including review of consultants reports, relevant laboratory and other test results, and medications completed;       Advised patient to discuss changes in her DM health and well being with provider;      Screening for signs and symptoms of depression related to chronic disease state;        Assessed social determinant of health barriers;        Symptom Management: Take medications as prescribed   Attend all scheduled provider appointments Call pharmacy for medication refills 3-7 days in advance of running out of medications Call provider  office for new concerns or questions  Work with the social worker to address care coordination needs and will continue to work with the clinical team to address health care and disease management related needs call the Suicide and Crisis Lifeline: 988 call the [place] National Suicide Prevention Lifeline: (724)373-1476 or TTY: 567-578-9245 TTY 219-603-7346) to talk to a trained counselor call 1-800-273-TALK (toll free, 24 hour hotline) if experiencing a Mental Health or Behavioral Health Crisis  schedule appointment with eye doctor check feet daily for cuts, sores or redness trim toenails straight across fill half of plate with vegetables manage portion size prepare main meal at home 3 to 5 days each week wash and dry feet carefully every day wear comfortable, cotton socks wear comfortable, well-fitting shoes  Follow Up Plan: Telephone follow up appointment with care management team member scheduled for: 04-03-2024 at 2:30 pm           Consent to Services:  Patient was given information about care management services, agreed to services, and gave verbal consent to participate.   Plan: Telephone follow up appointment with care management team member scheduled for:04-03-2024 at 2:30 pm  Larey Brick, BSN RN [facility], [facility] RN Care Manager Direct Dial: [phone]  Fax: [phone]

## 2024-03-06 NOTE — Patient Instructions (Signed)
 Visit Information  Thank you for taking time to visit with me today. Please don't hesitate to contact me if I can be of assistance to you before our next scheduled telephone appointment.  Following are the goals we discussed today:   Goals Addressed             This Visit's Progress    RNCM Care Management (depression and grief) EXPECTED OUTCOME: MONITOR, SELF-MANAGE AND REDUCE SYMPTOMS OF Depression and Grief       Current Barriers:  Care Coordination needs related to GriefShare support and resources in a patient with depression and grief Chronic Disease Management support and education needs related to effective management of depression and grief 10-06-2023- states that her brother is in Hospice care currently, she lost an Uncle on 04-02-24 10-24-2023, death anniversary of husband next week (8 years)  Planned Interventions: Evaluation of current treatment plan related to depression and grief and patient's adherence to plan as established by provider. Patient states she is doing well.Reflective listening and support.  Advised patient to call the office for changes in mood, anxiety, depression, or acute changes in mental health Provided education to patient re: about the GriefShare and the availability of grief support due to the loss of her husband, mom, brother, and other family members over the last few years. Reminder given of the [place] program and the resources available. Reviewed scheduled/upcoming provider appointments including 04-13-2024 with PCP Discussed plans with patient for ongoing care management follow up and provided patient with direct contact information for care management team Advised patient to discuss changes in her mental health, depression and grief with provider. Reflective listening and support given Screening for signs and symptoms of depression related to chronic disease state  Assessed social determinant of health barriers Symptom Management: Attend all  scheduled provider appointments Attend church or other social activities Work with the social worker to address care coordination needs and will continue to work with the clinical team to address health care and disease management related needs call the Suicide and Crisis Lifeline: 988 call the [place] National Suicide Prevention Lifeline: 272-662-0850 or TTY: (706) 157-0129 TTY 239-193-1913) to talk to a trained counselor call 1-800-273-TALK (toll free, 24 hour hotline) if experiencing a Mental Health or Behavioral Health Crisis   Follow Up Plan: Telephone follow up appointment with care management team member scheduled for: 04-03-2024 at 2:30 pm       RNCM Care Management Expected Outcome:  Monitor, Self-Manage and Reduce Symptoms of Afib       Current Barriers:  Knowledge Deficits related to AFIB and recent hospitalization for new onset of AFIB Care Coordination needs related to resources and management of AFIB in a patient with new onset of AFIB with hospitalization for AFIB with [facility] Chronic Disease Management support and education needs related to effective management of AFIB  Planned Interventions: Provider order and care plan reviewed. Reports Afib is stable with no changes. Counseled on increased risk of stroke due to Afib and benefits of anticoagulation for stroke prevention. Reviewed importance of adherence to anticoagulant exactly as prescribed. Compliance with Eliquis. Advised patient to discuss changes in her heart rate, changes in her sx and sx, questions and concerns with provider Counseled on bleeding risk associated with AFIB and importance of self-monitoring for signs/symptoms of bleeding Counseled on avoidance of NSAIDs due to increased bleeding risk with anticoagulants Counseled on importance of regular laboratory monitoring as prescribed. Had repeat labs last week for check on Magnesium levels Counseled on seeking medical  attention after a head injury or if there is blood in  the urine/stool Afib action plan reviewed. Screening for signs and symptoms of depression related to chronic disease state Assessed social determinant of health barriers  Symptom Management: Take medications as prescribed   Attend all scheduled provider appointments Call provider office for new concerns or questions  call the Suicide and Crisis Lifeline: 988 call the [place] National Suicide Prevention Lifeline: 3672506747 or TTY: (602)659-9497 TTY (986)307-5541) to talk to a trained counselor call 1-800-273-TALK (toll free, 24 hour hotline) if experiencing a Mental Health or Behavioral Health Crisis  - check pulse (heart) rate before taking medicine - make a plan to eat healthy - keep all lab appointments - take medicine as prescribed  Follow Up Plan: Telephone follow up appointment with care management team member scheduled for: 04-03-2024 at 2:30 pm       RNCM Care Management Expected Outcome:  Monitor, Self-Manage, and Reduce Symptoms of Hypertension       Current Barriers:  Chronic Disease Management support and education needs related to effective management of HTN Financial Constraints.   BP Readings from Last 3 Encounters:  03/02/24 130/64  12/01/23 122/76  11/16/23 138/69      Planned Interventions: Evaluation of current treatment plan related to hypertension self management and patient's adherence to plan as established by provider. Denies any new concerns with HTN. Reports stable BP. Provided education to patient re: stroke prevention, s/s of heart attack and stroke; Education and support Reviewed prescribed diet heart healthy/ADA diet. Reviewed medications with patient and discussed importance of compliance. The patient is compliant with medications. Discussed plans with patient for ongoing care management follow up and provided patient with direct contact information for care management team; Advised patient, providing education and rationale, to monitor blood pressure  daily and record, calling PCP for findings outside established parameters;  Advised patient to discuss changes in blood pressure or other heart concerns  with provider;  Discussed complications of poorly controlled blood pressure such as heart disease, stroke, circulatory complications, vision complications, kidney impairment, sexual dysfunction;  Next pcp appointment on 04-13-2024 Symptom Management: Take medications as prescribed   Attend all scheduled provider appointments Call pharmacy for medication refills 3-7 days in advance of running out of medications Call provider office for new concerns or questions  call the Suicide and Crisis Lifeline: 988 call the [place] National Suicide Prevention Lifeline: 514-432-6117 or TTY: 510-100-8437 TTY 825-112-9566) to talk to a trained counselor call 1-800-273-TALK (toll free, 24 hour hotline) if experiencing a Mental Health or Behavioral Health Crisis  check blood pressure weekly learn about high blood pressure call doctor for signs and symptoms of high blood pressure develop an action plan for high blood pressure keep all doctor appointments take medications for blood pressure exactly as prescribed report new symptoms to your doctor  Follow Up Plan: Telephone follow up appointment with care management team member scheduled for: 04-03-2024 at 2:30 pm       RNCM: Care Management Expected Outcome:  Monitor, Self-Manage and Reduce Symptoms of Diabetes       Current Barriers:  Care Coordination needs related to loss of job and inability to pay for medications in a patient with DM Chronic Disease Management support and education needs related to effective management of DM Financial Constraints- working with pharm D for PAP assistance Difficulty obtaining medications  Lab Results  Component Value Date   HGBA1C 6.0 (H) 03/02/2024    Planned Interventions: Provided education to patient  about basic DM disease process. A1C now at goal. Checks blood  sugar fasting 61 and is correcting it with juice this morning.  RNCM discussed having bed time snack to help level out blood sugars. Reviewed medications with patient and discussed importance of medication adherence. Works with PharmD for PAP. Reports compliance with all medications. Reviewed prescribed diet with patient heart healthy/ADA diet.   Counseled on importance of regular laboratory monitoring as prescribed. Has regular lab work ;        Discussed plans with patient for ongoing care management follow up and provided patient with direct contact information for care management team;      Provided patient with written educational materials related to hypo and hyperglycemia and importance of correct treatment.    Reviewed scheduled/upcoming provider appointments including: 04-13-2024 with pcp Advised patient, providing education and rationale, to check cbg when you have symptoms of low or high blood sugar and as directed   and record. Review of goal of fasting <130 and post prandial of <180     call provider for findings outside established parameters;       Referral made to social work team for assistance with financial barriers, grief and depression. Has worked with the [employer] before. Declines to speak with LCSW at this time. Review of patient status, including review of consultants reports, relevant laboratory and other test results, and medications completed;       Advised patient to discuss changes in her DM health and well being with provider;      Screening for signs and symptoms of depression related to chronic disease state;        Assessed social determinant of health barriers;        Symptom Management: Take medications as prescribed   Attend all scheduled provider appointments Call pharmacy for medication refills 3-7 days in advance of running out of medications Call provider office for new concerns or questions  Work with the social worker to address care coordination needs and  will continue to work with the clinical team to address health care and disease management related needs call the Suicide and Crisis Lifeline: 988 call the [place] National Suicide Prevention Lifeline: 850-043-6726 or TTY: 8647665611 TTY 630-711-6521) to talk to a trained counselor call 1-800-273-TALK (toll free, 24 hour hotline) if experiencing a Mental Health or Behavioral Health Crisis  schedule appointment with eye doctor check feet daily for cuts, sores or redness trim toenails straight across fill half of plate with vegetables manage portion size prepare main meal at home 3 to 5 days each week wash and dry feet carefully every day wear comfortable, cotton socks wear comfortable, well-fitting shoes  Follow Up Plan: Telephone follow up appointment with care management team member scheduled for: 04-03-2024 at 2:30 pm           Our next appointment is by telephone on 04-03-2024 at 2:30 pm  Please call the care guide team at [phone] if you need to cancel or reschedule your appointment.   If you are experiencing a Mental Health or Behavioral Health Crisis or need someone to talk to, please call the Suicide and Crisis Lifeline: 988 call the [place] National Suicide Prevention Lifeline: (862) 248-5737 or TTY: 650 297 3181 TTY 724-508-7469) to talk to a trained counselor call 1-800-273-TALK (toll free, 24 hour hotline)   Patient verbalizes understanding of instructions and care plan provided today and agrees to view in MyChart. Active MyChart status and patient understanding of how to access instructions and care plan  via MyChart confirmed with patient.     Telephone follow up appointment with care management team member scheduled for:04-03-2024 at 2:30 pm  Larey Brick, BSN RN [facility], [facility] RN Care Manager Direct Dial: [phone]  Fax: [phone]   Preventing Hypoglycemia Hypoglycemia is when the amount of sugar, or glucose, in  your blood is too low. Low blood sugar can happen if you have diabetes or if you don't have diabetes. It may be an emergency. Work with your health care provider to make and change your meal plan as needed. This can help prevent low blood sugar. What can increase my risk? You may be more likely to get low blood sugar if: You take insulin or other diabetes medicines. You skip or delay a meal or snack. You get sick. How can low blood sugar affect me? Mild symptoms Mild cases may not cause symptoms. If you do have symptoms, they may include: Hunger or feeling like you may vomit. Sweating and feeling cold to the touch. Feeling dizzy or light-headed. Being sleepy or having trouble sleeping. A fast heart rate. A headache. Blurry eyesight. Mood changes. These include feeling worried, nervous, or easily annoyed. Tingling or numbness around your mouth, lips, or tongue. If a mild case of low blood sugar isn't treated, it can become moderate or severe. Moderate symptoms If you have a moderate case, you may: Feel confused. Have changes in the way you act or move. Feel weak. Have an uneven heartbeat. Severe symptoms Having very low blood sugar is an emergency. It can cause: Fainting. Seizures. A coma. Death. What nutrition changes can I make? Work with your provider or an expert in healthy eating called a dietitian to make a meal plan. Eat meals at set times. Have snacks between meals, as told by your provider. Donot skip or delay meals or snacks. What other actions can I take to prevent low blood sugar?  Work closely with your provider to manage your blood sugar. Make sure you know: What your blood sugar should be. How and when to check your blood sugar. The symptoms of low blood sugar. Be sure to eat food when you drink alcohol. When you're sick, check your blood sugar more often. Make a sick day plan in advance with your provider. Follow this plan when you can't eat or drink like  normal. Always check your blood sugar before, during, and after exercise. How is this treated? Treating low blood sugar If you have low blood sugar, eat or drink something with sugar in it right away. The food or drink should have 15 grams of a fast-acting carbohydrate (carb). Options include: 4 oz (120 mL) of fruit juice. 4 oz (120 mL) of soda (not diet soda). A few pieces of hard candy. Check food labels to see how many pieces to eat. 1 Tbsp (15 mL) of sugar or honey. 4 glucose tablets. 1 tube of glucose gel. Treating low blood sugar if you have diabetes If you're alert and can swallow safely, follow the 15:15 rule: Take 15 grams of a fast-acting carb. Talk with your provider about how much carb you should take. Check your blood sugar 15 minutes after you take the carb. If your blood sugar is still at or below 70 mg/dL (3.9 mmol/L), take 15 grams of a carb again. If your blood sugar doesn't go above 70 mg/dL (3.9 mmol/L) after 3 tries, get help right away. After your blood sugar goes back to normal, eat a  meal or a snack within 1 hour. Treating very low blood sugar If your blood sugar is less than 54 mg/dL (3 mmol/L), it's an emergency. Get help right away. If you can't eat or drink, you will need to be given glucagon. A family member or friend should learn how to check your blood sugar and give you glucagon. Ask your provider if you should keep a glucagon kit at home. You may also need to be treated in a hospital. Where to find more information American Diabetes Association (ADA): diabetes.[employer] of Diabetes and Digestive and Kidney Diseases (NIDDK): [employer]Sync.si Association of Diabetes Care & Education Specialists: diabeteseducator.org Contact a health care provider if: You have diabetes and are having trouble keeping your blood sugar in the right range. You have low blood sugar often. Get help right away if: You can't get your blood sugar above 70 mg/dL (3.9  mmol/L) after 3 tries. Your blood sugar is below 54 mg/dL (3 mmol/L). You faint. You have a seizure. These symptoms may be an emergency. Call 911 right away. Do not wait to see if the symptoms will go away. Do not drive yourself to the hospital. This information is not intended to replace advice given to you by your health care provider. Make sure you discuss any questions you have with your health care provider. Document Revised: 03/04/2023 Document Reviewed: 03/04/2023 Elsevier Patient Education  2024 [employer].

## 2024-03-09 ENCOUNTER — Telehealth: Payer: Self-pay

## 2024-03-09 NOTE — Telephone Encounter (Signed)
 Called and informed patient of received patient assistance for Ozempic 1 mg.    Ozempic 1 mg  4 boxes NDC: [phone] Lot# [id number] Exp: 09-26-2026

## 2024-03-13 ENCOUNTER — Other Ambulatory Visit: Payer: Self-pay | Admitting: Family Medicine

## 2024-03-14 NOTE — Telephone Encounter (Signed)
 Patient came into the office today and picked up patient assistance, ozempic 4 boxes, patient signed blue paper stating she received medication and paper was placed in Kim's box. Patient has no questions or concerns at this time.

## 2024-03-20 ENCOUNTER — Other Ambulatory Visit: Payer: Self-pay | Admitting: Family Medicine

## 2024-03-20 ENCOUNTER — Telehealth: Payer: Self-pay

## 2024-03-20 MED ORDER — APIXABAN 5 MG PO TABS: 5.0000 mg | ORAL_TABLET | Freq: Two times a day (BID) | ORAL | 1 refills | Status: DC

## 2024-03-20 NOTE — Telephone Encounter (Signed)
 Can you assist with this?  Copied from CRM [id number]. Topic: Clinical - Prescription Issue >> Mar 20, 2024  9:43 AM Shon Hale wrote: Reason for CRM: Patient requesting financial assistance application to be started for the eliquis. Patient requested refill but informed by pharmacy that she would need to apply again.   Patient states it was done last year with the [facility] Patient [facility].

## 2024-03-20 NOTE — Telephone Encounter (Signed)
 Copied from CRM [id number]. Topic: Clinical - Medication Refill >> Mar 20, 2024  9:39 AM Shon Hale wrote: Most Recent Primary Care Visit:  Provider: [facility]  Department: [facility]  Visit Type: OFFICE VISIT  Date: 03/02/2024  Medication: apixaban (ELIQUIS) 5 MG TABS tablet  Has the patient contacted their pharmacy? Yes Patient informed to reach out to office for refill.   Is this the correct pharmacy for this prescription? Yes This is the patient's preferred pharmacy:  [facility] [address] [place] - [address] [place] [address] Phone: [phone] Fax: [phone]  Has the prescription been filled recently? No  Is the patient out of the medication? No  Has the patient been seen for an appointment in the last year OR does the patient have an upcoming appointment? Yes  Can we respond through MyChart? Yes  Agent: Please be advised that Rx refills may take up to 3 business days. We ask that you follow-up with your pharmacy.

## 2024-04-03 ENCOUNTER — Other Ambulatory Visit: Payer: Self-pay

## 2024-04-03 ENCOUNTER — Encounter: Payer: Self-pay | Admitting: Cardiology

## 2024-04-03 ENCOUNTER — Ambulatory Visit: Payer: PPO | Attending: Cardiology | Admitting: Cardiology

## 2024-04-03 VITALS — BP 138/78 | HR 76 | Ht 63.0 in | Wt 153.0 lb

## 2024-04-03 DIAGNOSIS — E785 Hyperlipidemia, unspecified: Secondary | ICD-10-CM | POA: Insufficient documentation

## 2024-04-03 DIAGNOSIS — I10 Essential (primary) hypertension: Secondary | ICD-10-CM | POA: Diagnosis not present

## 2024-04-03 DIAGNOSIS — E782 Mixed hyperlipidemia: Secondary | ICD-10-CM

## 2024-04-03 DIAGNOSIS — I480 Paroxysmal atrial fibrillation: Secondary | ICD-10-CM

## 2024-04-03 HISTORY — DX: Hyperlipidemia, unspecified: E78.5

## 2024-04-03 NOTE — Patient Instructions (Signed)
 Visit Information  Thank you for taking time to visit with me today. Please don't hesitate to contact me if I can be of assistance to you before our next scheduled appointment.  Our next appointment is by telephone on 05/05/2024  at 3:30 Please call the care guide team at [phone] if you need to cancel or reschedule your appointment.   Following is a copy of your care plan:   Goals Addressed             This Visit's Progress    VBCI RN Care Plan   On track    Problems:  Chronic Disease Management support and education needs related to DMII  Goal: Over the next 3 months the Patient will attend all scheduled medical appointments:   as evidenced by chart review and patient repoirt        continue to work with RN Care Manager and/or Social Worker to address care management and care coordination needs related to DMII as evidenced by adherence to CM Team Scheduled appointments     demonstrate a decrease DMII in exacerbations as evidenced by controlled blood sugars within normal ranges  Interventions:   Diabetes Interventions:  (Status:  Goal on track:  Yes.) Long Term Goal Assessed patient's understanding of A1c goal: <6.5% Provided education to patient about basic DM disease process Reviewed medications with patient and discussed importance of medication adherence Counseled on importance of regular laboratory monitoring as prescribed Discussed plans with patient for ongoing care management follow up and provided patient with direct contact information for care management team Reviewed scheduled/upcoming provider appointments including: PCP4/17/25, Lab 05/15/24, PCP 06/06/24 Advised patient, providing education and rationale, to check cbg daily prior to breakfast and record, calling PCP for findings outside established parameters Lab Results  Component Value Date   HGBA1C 6.0 (H) 03/02/2024    Patient Self-Care Activities:  Attend all scheduled provider appointments Call pharmacy  for medication refills 3-7 days in advance of running out of medications Call provider office for new concerns or questions  Perform all self care activities independently  Take medications as prescribed    Plan:  Telephone follow up appointment with care management team member scheduled for:  05/05/24 at 3:30 pm             Please call the Suicide and Crisis Lifeline: 988 call the [place] National Suicide Prevention Lifeline: 508-474-5178 or TTY: 727-620-0511 TTY 930-479-8521) to talk to a trained counselor call 1-800-273-TALK (toll free, 24 hour hotline) if you are experiencing a Mental Health or Behavioral Health Crisis or need someone to talk to.  Patient verbalizes understanding of instructions and care plan provided today and agrees to view in MyChart. Active MyChart status and patient understanding of how to access instructions and care plan via MyChart confirmed with patient.      Ruel Favors BSN RN CCM [place]  [facility], [facility] RN Care Manager Direct Dial: [phone] Fax: [phone]

## 2024-04-03 NOTE — Patient Outreach (Signed)
 Complex Care Management   Visit Note  04/03/2024  Name:  Rachel Hensley MRN: [id number] DOB: [birth year]  Situation: Referral received for Complex Care Management related to Diabetes with Complications I obtained verbal consent from patient.  Visit completed with patient  on the phone  Background:   Past Medical History:  Diagnosis Date   A-fib [facility])    Angioedema 04/05/2018   GERD (gastroesophageal reflux disease)    History of migraine    Hyperlipidemia    Hypertension    Hypoglycemia due to type 2 diabetes mellitus (HCC) 08/21/2020   Mixed hyperlipidemia    Nontoxic multinodular goiter    Other vitamin B12 deficiency anemias    Primary insomnia 05/20/2020   Solitary pulmonary nodule    Type 2 diabetes mellitus with other specified complication [facility])     Assessment: Patient Reported Symptoms:  Cognitive Alert and oriented to person, place, and time  Neurological No symptoms reported    HEENT Nasal discharge (seasonal allergies treated with medication)    Cardiovascular  (Afib in treatment with CVD,)    Respiratory No symptoms reported    Endocrine No symptoms reported    Gastrointestinal No symptoms reported    Genitourinary      Integumentary No symptoms reported    Musculoskeletal      Psychosocial       Vitals:   04/03/24 1447  BP: 138/78    Medications Reviewed Today     Reviewed by Ruel Favors, RN (Registered Nurse) on 04/03/24 at 1457  Med List Status: <None>   Medication Order Taking? Sig Documenting Provider Last Dose Status Informant  apixaban (ELIQUIS) 5 MG TABS tablet [id number] Yes Take 1 tablet (5 mg total) by mouth 2 (two) times daily. Cox, Kirsten, MD Taking Active   atorvastatin (LIPITOR) 20 MG tablet [id number] Yes Take 1 tablet by mouth once daily Cox, Kirsten, MD Taking Active   cetirizine (ZYRTEC) 10 MG tablet [id number] Yes Take 10 mg by mouth daily. [provider] Taking Active   diltiazem (CARDIZEM) 30 MG tablet  [id number] Yes Take 30 mg by mouth every 6 (six) hours as needed (HR). [provider] Taking Active   Empagliflozin-metFORMIN HCl ER (SYNJARDY XR) 12.04-999 MG TB24 [id number] Yes Take 1 tablet by mouth daily. [provider] Taking Active   EPINEPHrine 0.3 mg/0.3 mL IJ SOAJ injection [id number] Yes Inject 0.3 mg into the muscle once. [provider] Taking Active   flecainide (TAMBOCOR) 50 MG tablet [id number] Yes Take 1 tablet (50 mg total) by mouth 2 (two) times daily. Georgeanna Lea, MD Taking Active   glimepiride (AMARYL) 4 MG tablet [id number] Yes Take 1 tablet by mouth twice daily Cox, Kirsten, MD Taking Active   icosapent Ethyl (VASCEPA) 1 g capsule [id number] Yes Take 2 capsules (2 g total) by mouth 2 (two) times daily. Cox, Kirsten, MD Taking Active   magnesium oxide (MAG-OX) 400 (240 Mg) MG tablet [id number] Yes Take 800 mg by mouth 2 (two) times daily. [provider] Taking Active   metoprolol succinate (TOPROL-XL) 50 MG 24 hr tablet [id number] Yes TAKE 1 TABLET BY MOUTH ONCE DAILY.  TAKE WITH OR IMMEDIATELY FOLLOWING A MEAL  Patient taking differently: Take 50 mg by mouth daily.   Georgeanna Lea, MD Taking Active   omeprazole (PRILOSEC) 20 MG capsule [id number] Yes Take 1 capsule by mouth twice daily Cox, Kirsten, MD Taking Active   ondansetron (ZOFRAN ODT) 4 MG disintegrating tablet [id number] No Take 1 tablet (  4 mg total) by mouth every 8 (eight) hours as needed for nausea or vomiting.  Patient not taking: Reported on 04/03/2024   Janie Morning, NP Not Taking Active            Med Note Julian Reil, Coltrane Tugwell   Mon Apr 03, 2024  2:56 PM) Has on hand, not needed  promethazine (PHENERGAN) 25 MG tablet [id number] No Take 1 tablet (25 mg total) by mouth every 8 (eight) hours as needed for nausea or vomiting.  Patient not taking: Reported on 04/03/2024   Janie Morning, NP Not Taking Active            Med Note Julian Reil, Princella Jaskiewicz   Mon Apr 03, 2024  2:57 PM) Has  on hand, has not needed  Semaglutide, 1 MG/DOSE, 4 MG/3ML SOPN [id number] Yes Inject 1 mg as directed once a week. Blane Ohara, MD Taking Active             Recommendation:   PCP Follow-up on 04/13/24  Follow Up Plan:   Telephone follow up appointment date/time:  05/05/24 at 3:30 pm   Ruel Favors BSN RN CCM [place]  [facility], [facility] RN Care Manager Direct Dial: [phone] Fax: [phone]

## 2024-04-03 NOTE — Progress Notes (Signed)
 Cardiology Office Note:    Date:  04/03/2024   ID:  Rachel Hensley, DOB 1956/06/09, MRN [id number]  PCP:  Blane Ohara, MD  Cardiologist:  Gypsy Balsam, MD    Referring MD: Blane Ohara, MD   Chief Complaint  Patient presents with   Follow-up    History of Present Illness:    Rachel Hensley is a 68 y.o. female past medical history significant for paroxysmal atrial fibrillation, rhythm control strategy accomplished with flecainide 50 twice daily, she is anticoagulated.  In preparation for antiarrhythmic therapy she had a stress test and an echocardiogram both were normal.  Comes today to my office cardiac wise doing well described to have rare episode of skipped beats but no sustained arrhythmia, denies have any chest pain tightness squeezing pressure burning chest no palpitations dizziness swelling of lower extremities.  Biggest problem is back issues she still having some physical therapy which aggravated her right knee she wears bracelets right now.  Past Medical History:  Diagnosis Date   A-fib [facility])    Angioedema 04/05/2018   GERD (gastroesophageal reflux disease)    History of migraine    Hyperlipidemia    Hypertension    Hypoglycemia due to type 2 diabetes mellitus (HCC) 08/21/2020   Mixed hyperlipidemia    Nontoxic multinodular goiter    Other vitamin B12 deficiency anemias    Primary insomnia 05/20/2020   Solitary pulmonary nodule    Type 2 diabetes mellitus with other specified complication [facility])     Past Surgical History:  Procedure Laterality Date   ABDOMINAL HYSTERECTOMY  06/1993   partial; still has both ovaries   HYSTEROTOMY     partial    Current Medications: Current Meds  Medication Sig   apixaban (ELIQUIS) 5 MG TABS tablet Take 1 tablet (5 mg total) by mouth 2 (two) times daily.   atorvastatin (LIPITOR) 20 MG tablet Take 1 tablet by mouth once daily   cetirizine (ZYRTEC) 10 MG tablet Take 10 mg by mouth daily.   diltiazem (CARDIZEM) 30 MG  tablet Take 30 mg by mouth every 6 (six) hours as needed (HR).   Empagliflozin-metFORMIN HCl ER (SYNJARDY XR) 12.04-999 MG TB24 Take 1 tablet by mouth daily.   EPINEPHrine 0.3 mg/0.3 mL IJ SOAJ injection Inject 0.3 mg into the muscle once.   flecainide (TAMBOCOR) 50 MG tablet Take 1 tablet (50 mg total) by mouth 2 (two) times daily.   glimepiride (AMARYL) 4 MG tablet Take 1 tablet by mouth twice daily   icosapent Ethyl (VASCEPA) 1 g capsule Take 2 capsules (2 g total) by mouth 2 (two) times daily.   magnesium oxide (MAG-OX) 400 (240 Mg) MG tablet Take 800 mg by mouth 2 (two) times daily.   metoprolol succinate (TOPROL-XL) 50 MG 24 hr tablet TAKE 1 TABLET BY MOUTH ONCE DAILY.  TAKE WITH OR IMMEDIATELY FOLLOWING A MEAL (Patient taking differently: Take 50 mg by mouth daily.)   omeprazole (PRILOSEC) 20 MG capsule Take 1 capsule by mouth twice daily   ondansetron (ZOFRAN ODT) 4 MG disintegrating tablet Take 1 tablet (4 mg total) by mouth every 8 (eight) hours as needed for nausea or vomiting.   promethazine (PHENERGAN) 25 MG tablet Take 1 tablet (25 mg total) by mouth every 8 (eight) hours as needed for nausea or vomiting.   Semaglutide, 1 MG/DOSE, 4 MG/3ML SOPN Inject 1 mg as directed once a week.     Allergies:   Actos [pioglitazone], Fenofibrate, Lisinopril, and Prednisone  Social History   Socioeconomic History   Marital status: Widowed    Spouse name: Not on file   Number of children: 3   Years of education: Not on file   Highest education level: GED or equivalent  Occupational History   Occupation: RETIRED SEWER  Tobacco Use   Smoking status: Never   Smokeless tobacco: Never  Vaping Use   Vaping status: Never Used  Substance and Sexual Activity   Alcohol use: Never   Drug use: Never   Sexual activity: Not Currently  Other Topics Concern   Not on file  Social History Narrative   Not on file   Social Drivers of Health   Financial Resource Strain: Low Risk  (07/23/2023)    Overall Financial Resource Strain (CARDIA)    Difficulty of Paying Living Expenses: Not very hard  Recent Concern: Financial Resource Strain - Medium Risk (06/02/2023)   Overall Financial Resource Strain (CARDIA)    Difficulty of Paying Living Expenses: Somewhat hard  Food Insecurity: No Food Insecurity (03/02/2024)   Hunger Vital Sign    Worried About Running Out of Food in the Last Year: Never true    Ran Out of Food in the Last Year: Never true  Transportation Needs: No Transportation Needs (03/02/2024)   PRAPARE - Administrator, Civil Service (Medical): No    Lack of Transportation (Non-Medical): No  Physical Activity: Inactive (07/23/2023)   Exercise Vital Sign    Days of Exercise per Week: 0 days    Minutes of Exercise per Session: 30 min  Stress: No Stress Concern Present (07/23/2023)   [employer] of Occupational Health - Occupational Stress Questionnaire    Feeling of Stress : Not at all  Social Connections: Unknown (07/23/2023)   Social Connection and Isolation Panel [NHANES]    Frequency of Communication with Friends and Family: More than three times a week    Frequency of Social Gatherings with Friends and Family: Three times a week    Attends Religious Services: More than 4 times per year    Active Member of Clubs or Organizations: Not on file    Attends Banker Meetings: Not on file    Marital Status: Widowed  Recent Concern: Social Connections - Moderately Isolated (07/23/2023)   Social Connection and Isolation Panel [NHANES]    Frequency of Communication with Friends and Family: More than three times a week    Frequency of Social Gatherings with Friends and Family: Three times a week    Attends Religious Services: More than 4 times per year    Active Member of Clubs or Organizations: No    Attends Banker Meetings: Never    Marital Status: Widowed     Family History: The patient's family history includes Arrhythmia in her son;  Asthma in her son; Atopy in her grandchild; Atrial fibrillation in her sister and son; Breast cancer in her paternal aunt; Colon cancer in her father and paternal uncle; Coronary artery disease in her brother and mother; Diabetes in her brother, brother, and sister; Diabetes type II in her brother; Eczema in her grandchild; Glaucoma in her mother; Kidney cancer in her brother; Lymphoma in her maternal aunt; Parkinsonism in her son; Prostate cancer in her brother; Throat cancer in her cousin. There is no history of Allergic rhinitis, Angioedema, Immunodeficiency, or Urticaria. ROS:   Please see the history of present illness.    All 14 point review of systems negative except as described per  history of present illness  EKGs/Labs/Other Studies Reviewed:         Recent Labs: 07/29/2023: Magnesium 1.8 03/02/2024: ALT 22; BUN 24; Creatinine, Ser 0.83; Hemoglobin 14.9; Platelets 263; Potassium 4.7; Sodium 145  Recent Lipid Panel    Component Value Date/Time   CHOL 130 03/02/2024 0923   TRIG 172 (H) 03/02/2024 0923   HDL 41 03/02/2024 0923   CHOLHDL 3.2 03/02/2024 0923   LDLCALC 60 03/02/2024 0923    Physical Exam:    VS:  BP 138/78 (BP Location: Right Arm)   Pulse 76   Ht 5\' 3"  (1.6 m)   Wt 153 lb (69.4 kg)   LMP  (LMP Unknown)   SpO2 98%   BMI 27.10 kg/m     Wt Readings from Last 3 Encounters:  04/03/24 153 lb (69.4 kg)  03/02/24 151 lb (68.5 kg)  12/01/23 154 lb (69.9 kg)     GEN:  Well nourished, well developed in no acute distress HEENT: Normal NECK: No JVD; No carotid bruits LYMPHATICS: No lymphadenopathy CARDIAC: RRR, no murmurs, no rubs, no gallops RESPIRATORY:  Clear to auscultation without rales, wheezing or rhonchi  ABDOMEN: Soft, non-tender, non-distended MUSCULOSKELETAL:  No edema; No deformity  SKIN: Warm and dry LOWER EXTREMITIES: no swelling NEUROLOGIC:  Alert and oriented x 3 PSYCHIATRIC:  Normal affect   ASSESSMENT:    1. Paroxysmal atrial fibrillation  (HCC)   2. Essential hypertension, benign   3. Mixed hyperlipidemia   4. Dyslipidemia    PLAN:    In order of problems listed above:  Paroxysmal atrial fibrillation maintaining sinus rhythm continue present management EKG is normal.  Normal QT interval normal QS complex duration morphology, continue anticoagulation continue flecainide. Essential hypertension blood pressure well-controlled continue present management. Mixed dyslipidemia I did review K PN show data from March 02, 2024 LDL 60 HDL 41 we will continue present management. We did talk about healthy lifestyle she is frustrated because she cannot exercise because of chronic back pain and knee issues   Medication Adjustments/Labs and Tests Ordered: Current medicines are reviewed at length with the patient today.  Concerns regarding medicines are outlined above.  Orders Placed This Encounter  Procedures   EKG 12-Lead   Medication changes: No orders of the defined types were placed in this encounter.   Signed, Georgeanna Lea, MD, [facility] 04/03/2024 8:19 AM    [facility]

## 2024-04-03 NOTE — Patient Instructions (Signed)

## 2024-04-04 ENCOUNTER — Telehealth: Payer: Self-pay | Admitting: Family Medicine

## 2024-04-04 NOTE — Telephone Encounter (Signed)
 [facility] Plan of Care Visit Report for 03/09/24.

## 2024-04-06 ENCOUNTER — Other Ambulatory Visit: Payer: Self-pay | Admitting: Family Medicine

## 2024-04-06 LAB — HM DIABETES EYE EXAM

## 2024-04-07 ENCOUNTER — Other Ambulatory Visit: Payer: Self-pay | Admitting: Family Medicine

## 2024-04-11 ENCOUNTER — Encounter: Payer: Self-pay | Admitting: Family Medicine

## 2024-04-12 NOTE — Progress Notes (Unsigned)
 Subjective:  Patient ID: Rachel Hensley, female    DOB: [birth year]  Age: 68 y.o. MRN: [id number]  No chief complaint on file.  Discussed the use of AI scribe software for clinical note transcription with the patient, who gave verbal consent to proceed.  History of Present Illness   The patient, with a history of diabetes, presents with persistent diarrhea. She reports that the frequency of loose stools varies daily, with some days being 'really bad' and others without any bowel movements. This has been ongoing for several months. The patient is currently on Synjardy , which was recently reduced to once daily from twice daily, with a slight improvement in symptoms. She takes this medication with her largest meal, supper.  The patient also reports intermittent pain under the right rib cage and occasional nausea, which is not consistently related to meals. She denies fevers, chills, and sweats.  The patient's diabetes is managed with Synjardy , Ozempic , and glimepiride . She has been monitoring her blood sugars more closely and reports that she has been 'doing really good.' However, she did have one episode of hypoglycemia with a blood sugar of 57 right before supper, despite having eaten breakfast and lunch and not having taken Synjardy  that day.  The patient also has a history of back pain and numbness in the leg, which has improved with physical therapy. However, she has recently developed a new knee pain, which is suspected to be due to a piece of the meniscus breaking off. She received a cortisone shot for this and is scheduled for a follow-up in four weeks. If the pain does not improve, an MRI may be performed. The patient also reports a little bit of pain and soreness in the right shoulder blade.       Diabetes: Sugars:76-158 A1c 6.0.  Lumbar back pain: saw physical therapy which helped her back, but still having radicular symptoms. She hurt her knee in physical therapy, so stopped going  about 3 weeks ago. She saw [facility] orthopedics. Given arthrocentesis. Following up in 4 weeks and if not improved, will likely get an mri and need surgery.  Xrays of right knee and hips done.  MRI lumbar spine done yesterday.         12/01/2023    8:29 AM 08/16/2023    4:19 PM 07/29/2023    8:38 AM 06/02/2023    3:56 PM 04/09/2023    8:20 AM  Depression screen PHQ 2/9  Decreased Interest 0 0 0 0 0  Down, Depressed, Hopeless 0 0 1 0 0  PHQ - 2 Score 0 0 1 0 0  Altered sleeping 1  3    Tired, decreased energy 1  3    Change in appetite 0  1    Feeling bad or failure about yourself  0  0    Trouble concentrating 0  0    Moving slowly or fidgety/restless 0  0    Suicidal thoughts 0  0    PHQ-9 Score 2  8    Difficult doing work/chores Not difficult at all  Not difficult at all          04/03/2024    3:11 PM  Fall Risk   Falls in the past year? 0    Patient Care Team: Mercy Stall, MD as PCP - General (Family Medicine) Manfred Seed, MD as Consulting Physician (Cardiology) Clarnce Crow, RN as [facility] Care Management (General Practice)   Review of Systems  Constitutional:  Negative  for chills, fatigue and fever.  HENT:  Negative for congestion, ear pain and sore throat.   Respiratory:  Negative for cough and shortness of breath.   Cardiovascular:  Negative for chest pain.  Gastrointestinal:  Positive for abdominal pain (ruq), diarrhea and nausea (brief.). Negative for constipation and vomiting.  Genitourinary:  Negative for dysuria and frequency.  Musculoskeletal:  Negative for arthralgias and myalgias.  Neurological:  Negative for dizziness and headaches.  Psychiatric/Behavioral:  Negative for dysphoric mood. The patient is not nervous/anxious.     Current Outpatient Medications on File Prior to Visit  Medication Sig Dispense Refill   apixaban  (ELIQUIS ) 5 MG TABS tablet Take 1 tablet (5 mg total) by mouth 2 (two) times daily. 180 tablet 1   atorvastatin  (LIPITOR) 20 MG  tablet Take 1 tablet by mouth once daily 90 tablet 0   cetirizine (ZYRTEC) 10 MG tablet Take 10 mg by mouth daily.     diltiazem (CARDIZEM) 30 MG tablet Take 30 mg by mouth every 6 (six) hours as needed (HR).     Empagliflozin -metFORMIN  HCl ER (SYNJARDY  XR) 12.04-999 MG TB24 Take 1 tablet by mouth daily.     EPINEPHrine  0.3 mg/0.3 mL IJ SOAJ injection Inject 0.3 mg into the muscle once.     flecainide  (TAMBOCOR ) 50 MG tablet Take 1 tablet (50 mg total) by mouth 2 (two) times daily. 180 tablet 1   glimepiride  (AMARYL ) 4 MG tablet Take 1 tablet by mouth twice daily 180 tablet 0   icosapent  Ethyl (VASCEPA ) 1 g capsule Take 2 capsules (2 g total) by mouth 2 (two) times daily. 360 capsule 1   magnesium oxide (MAG-OX) 400 (240 Mg) MG tablet Take 800 mg by mouth 2 (two) times daily.     metoprolol  succinate (TOPROL -XL) 50 MG 24 hr tablet TAKE 1 TABLET BY MOUTH ONCE DAILY.  TAKE WITH OR IMMEDIATELY FOLLOWING A MEAL (Patient taking differently: Take 50 mg by mouth daily.) 90 tablet 2   omeprazole  (PRILOSEC) 20 MG capsule Take 1 capsule by mouth twice daily 180 capsule 0   ondansetron  (ZOFRAN  ODT) 4 MG disintegrating tablet Take 1 tablet (4 mg total) by mouth every 8 (eight) hours as needed for nausea or vomiting. (Patient not taking: Reported on 04/03/2024) 30 tablet 0   promethazine  (PHENERGAN ) 25 MG tablet Take 1 tablet (25 mg total) by mouth every 8 (eight) hours as needed for nausea or vomiting. (Patient not taking: Reported on 04/03/2024) 20 tablet 0   Semaglutide , 1 MG/DOSE, 4 MG/3ML SOPN Inject 1 mg as directed once a week. 3 mL 1   No current facility-administered medications on file prior to visit.   Past Medical History:  Diagnosis Date   A-fib [facility])    Angioedema 04/05/2018   GERD (gastroesophageal reflux disease)    History of migraine    Hyperlipidemia    Hypertension    Hypoglycemia due to type 2 diabetes mellitus (HCC) 08/21/2020   Mixed hyperlipidemia    Nontoxic multinodular goiter     Other vitamin B12 deficiency anemias    Primary insomnia 05/20/2020   Solitary pulmonary nodule    Type 2 diabetes mellitus with other specified complication [facility])    Past Surgical History:  Procedure Laterality Date   ABDOMINAL HYSTERECTOMY  06/1993   partial; still has both ovaries   HYSTEROTOMY     partial    Family History  Problem Relation Age of Onset   Coronary artery disease Mother    Glaucoma Mother  Colon cancer Father    Diabetes Sister    Atrial fibrillation Sister    Kidney cancer Brother    Diabetes type II Brother    Diabetes Brother    Prostate cancer Brother    Diabetes Brother    Coronary artery disease Brother    Asthma Son        childhood asthma   Arrhythmia Son    Atrial fibrillation Son    Parkinsonism Son    Eczema Grandchild    Atopy Grandchild    Colon cancer Paternal Uncle    Throat cancer Cousin    Lymphoma Maternal Aunt    Breast cancer Paternal Aunt    Allergic rhinitis Neg Hx    Angioedema Neg Hx    Immunodeficiency Neg Hx    Urticaria Neg Hx    Social History   Socioeconomic History   Marital status: Widowed    Spouse name: Not on file   Number of children: 3   Years of education: Not on file   Highest education level: GED or equivalent  Occupational History   Occupation: RETIRED SEWER  Tobacco Use   Smoking status: Never   Smokeless tobacco: Never  Vaping Use   Vaping status: Never Used  Substance and Sexual Activity   Alcohol use: Never   Drug use: Never   Sexual activity: Not Currently  Other Topics Concern   Not on file  Social History Narrative   Not on file   Social Drivers of Health   Financial Resource Strain: Low Risk  (07/23/2023)   Overall Financial Resource Strain (CARDIA)    Difficulty of Paying Living Expenses: Not very hard  Recent Concern: Financial Resource Strain - Medium Risk (06/02/2023)   Overall Financial Resource Strain (CARDIA)    Difficulty of Paying Living Expenses: Somewhat hard  Food  Insecurity: No Food Insecurity (03/02/2024)   Hunger Vital Sign    Worried About Running Out of Food in the Last Year: Never true    Ran Out of Food in the Last Year: Never true  Transportation Needs: No Transportation Needs (03/02/2024)   PRAPARE - Administrator, Civil Service (Medical): No    Lack of Transportation (Non-Medical): No  Physical Activity: Inactive (07/23/2023)   Exercise Vital Sign    Days of Exercise per Week: 0 days    Minutes of Exercise per Session: 30 min  Stress: No Stress Concern Present (07/23/2023)   [employer] of Occupational Health - Occupational Stress Questionnaire    Feeling of Stress : Not at all  Social Connections: Unknown (07/23/2023)   Social Connection and Isolation Panel [NHANES]    Frequency of Communication with Friends and Family: More than three times a week    Frequency of Social Gatherings with Friends and Family: Three times a week    Attends Religious Services: More than 4 times per year    Active Member of Clubs or Organizations: Not on file    Attends Banker Meetings: Not on file    Marital Status: Widowed  Recent Concern: Social Connections - Moderately Isolated (07/23/2023)   Social Connection and Isolation Panel [NHANES]    Frequency of Communication with Friends and Family: More than three times a week    Frequency of Social Gatherings with Friends and Family: Three times a week    Attends Religious Services: More than 4 times per year    Active Member of Clubs or Organizations: No    Attends Club  or Organization Meetings: Never    Marital Status: Widowed    Objective:  LMP  (LMP Unknown)      04/03/2024    2:47 PM 04/03/2024    8:06 AM 03/02/2024    8:31 AM  BP/Weight  Systolic BP 138 138 130  Diastolic BP 78 78 64  Wt. (Lbs)  153 151  BMI  27.1 kg/m2 26.75 kg/m2    Physical Exam  Diabetic Foot Exam - Simple   No data filed      Lab Results  Component Value Date   WBC 8.6 03/02/2024   HGB  14.9 03/02/2024   HCT 43.5 03/02/2024   PLT 263 03/02/2024   GLUCOSE 106 (H) 03/02/2024   CHOL 130 03/02/2024   TRIG 172 (H) 03/02/2024   HDL 41 03/02/2024   LDLCALC 60 03/02/2024   ALT 22 03/02/2024   AST 20 03/02/2024   NA 145 (H) 03/02/2024   K 4.7 03/02/2024   CL 103 03/02/2024   CREATININE 0.83 03/02/2024   BUN 24 03/02/2024   CO2 22 03/02/2024   TSH 0.777 01/19/2023   HGBA1C 6.0 (H) 03/02/2024   MICROALBUR 150 10/27/2021      Assessment & Plan:  Assessment and Plan    Diabetes Mellitus She experiences persistent diarrhea, likely due to Synjardy  (Jardiance  and metformin ). Blood sugar levels are well-maintained, with an episode of hypoglycemia at 57 mg/dL, possibly due to glipizide. The plan is to simplify the medication regimen to reduce side effects and maintain glycemic control. - Discontinue Synjardy  - Initiate Jardiance  monotherapy with increased dose - Discontinue glimepiride  - Continue Ozempic  at 1 mg weekly - Schedule follow-up with pharmacist in two weeks to review medication regimen and blood sugar log  Knee Pain She experiences knee pain, possibly due to a meniscal tear. An orthopedic evaluation suggested a meniscal fragment causing mechanical symptoms. A cortisone injection was administered, with some improvement, but stiffness and soreness persist. - Continue wearing knee brace - Follow up with orthopedics in four weeks - Consider MRI if symptoms persist  Back Pain with Radiculopathy She has chronic back pain with leg numbness, possibly due to arthritis and spinal spurs. An MRI was performed, revealing significant arthritis and spurs. Leg weakness and numbness limit mobility. - Await MRI results - Consider further evaluation based on MRI findings  Possible Gallbladder Dysfunction She reports intermittent right upper quadrant pain and nausea, possibly related to gallbladder dysfunction. Symptoms are tolerable, and she avoids fried foods, known to trigger  symptoms. - Provide gallbladder diet information - Consider ultrasound of gallbladder if symptoms worsen  General Health Maintenance Recent cardiology and ophthalmology evaluations were favorable. The cardiologist reported no issues, and the ophthalmologist noted a stable ocular freckle, with no follow-up needed for a year. - Continue regular follow-ups with cardiologist and ophthalmologist          There are no diagnoses linked to this encounter.   No orders of the defined types were placed in this encounter.   No orders of the defined types were placed in this encounter.    Follow-up: No follow-ups on file.   I,Marla I Leal-Borjas,acting as a scribe for Mercy Stall, MD.,have documented all relevant documentation on the behalf of Mercy Stall, MD,as directed by  Mercy Stall, MD while in the presence of Mercy Stall, MD.   An After Visit Summary was printed and given to the patient.  Mercy Stall, MD [facility] [phone]

## 2024-04-13 ENCOUNTER — Ambulatory Visit (INDEPENDENT_AMBULATORY_CARE_PROVIDER_SITE_OTHER): Admitting: Family Medicine

## 2024-04-13 ENCOUNTER — Encounter: Payer: Self-pay | Admitting: Family Medicine

## 2024-04-13 VITALS — BP 116/70 | HR 65 | Temp 97.6°F | Ht 63.0 in | Wt 151.6 lb

## 2024-04-13 DIAGNOSIS — M5440 Lumbago with sciatica, unspecified side: Secondary | ICD-10-CM

## 2024-04-13 DIAGNOSIS — M25562 Pain in left knee: Secondary | ICD-10-CM | POA: Diagnosis not present

## 2024-04-13 DIAGNOSIS — G8929 Other chronic pain: Secondary | ICD-10-CM

## 2024-04-13 DIAGNOSIS — K828 Other specified diseases of gallbladder: Secondary | ICD-10-CM

## 2024-04-13 DIAGNOSIS — E1121 Type 2 diabetes mellitus with diabetic nephropathy: Secondary | ICD-10-CM | POA: Diagnosis not present

## 2024-04-13 DIAGNOSIS — R197 Diarrhea, unspecified: Secondary | ICD-10-CM

## 2024-04-13 NOTE — Patient Instructions (Addendum)
 Stop glimeperide.  Change synjardy to jardiance 10 mg 2 daily.  Continue ozempic at 1 mg week. Consider increasing to 2 mg weekly.  Bring back stool sample.   Pharmacist: Rolando Cliche, [facility].

## 2024-04-16 DIAGNOSIS — K828 Other specified diseases of gallbladder: Secondary | ICD-10-CM

## 2024-04-16 HISTORY — DX: Other specified diseases of gallbladder: K82.8

## 2024-04-16 NOTE — Assessment & Plan Note (Signed)
 She experiences knee pain, possibly due to a meniscal tear. An orthopedic evaluation suggested a meniscal fragment causing mechanical symptoms. A cortisone injection was administered, with some improvement, but stiffness and soreness persist. - Continue wearing knee brace - Follow up with orthopedics in four weeks - Consider MRI if symptoms persist

## 2024-04-16 NOTE — Assessment & Plan Note (Signed)
 She experiences persistent diarrhea, likely due to Synjardy  (Jardiance  and metformin ). Blood sugar levels are well-maintained, with an episode of hypoglycemia at 57 mg/dL, possibly due to glipizide. The plan is to simplify the medication regimen to reduce side effects and maintain glycemic control. - Discontinue Synjardy  - Initiate Jardiance  monotherapy with increased dose - Discontinue glimepiride  - Continue Ozempic  at 1 mg weekly - Schedule follow-up with pharmacist in two weeks to review medication regimen and blood sugar log

## 2024-04-16 NOTE — Assessment & Plan Note (Signed)
 She has chronic back pain with leg numbness, possibly due to arthritis and spinal spurs. An MRI was performed, revealing significant arthritis and spurs. Leg weakness and numbness limit mobility. - Await MRI results - Consider further evaluation based on MRI findings

## 2024-04-16 NOTE — Assessment & Plan Note (Signed)
 She reports intermittent right upper quadrant pain and nausea, possibly related to gallbladder dysfunction. Symptoms are tolerable, and she avoids fried foods, known to trigger symptoms. - Provide gallbladder diet information - Consider ultrasound of gallbladder if symptoms worsen

## 2024-04-16 NOTE — Assessment & Plan Note (Signed)
Order stool studies.

## 2024-04-18 ENCOUNTER — Ambulatory Visit: Payer: Self-pay

## 2024-04-18 NOTE — Telephone Encounter (Signed)
 Pt called to ask for instructions on how to collect a stool sample at home. RN called the CAL to ensure RN gave pt the correct information. RN advised pt to use the hat in the toilet and then scoop the stool into the container to the red line and refrigerate. RN advised pt she has 24 hours to bring it to the office after collection. Pt verbalized understanding.   Reason for Disposition  Health Information question, no triage required and triager able to answer question  Answer Assessment - Initial Assessment Questions 1. REASON FOR CALL or QUESTION: "What is your reason for calling today?" or "How can I best help you?" or "What question do you have that I can help answer?"     Pt calls to ask for instructions on how to collect a stool-sample at home.   Per CAL - Scoop it out of hat, put it in container to the red line, refrigerate, and bring back within 24 hours  Protocols used: Information Only Call - No Triage-A-AH

## 2024-04-19 ENCOUNTER — Encounter: Payer: Self-pay | Admitting: Family Medicine

## 2024-04-19 LAB — GI PROFILE, STOOL, PCR

## 2024-04-21 ENCOUNTER — Telehealth: Payer: Self-pay

## 2024-04-21 NOTE — Telephone Encounter (Signed)
 Error

## 2024-04-24 ENCOUNTER — Other Ambulatory Visit: Payer: Self-pay | Admitting: Physician Assistant

## 2024-04-24 DIAGNOSIS — M5416 Radiculopathy, lumbar region: Secondary | ICD-10-CM

## 2024-04-27 ENCOUNTER — Ambulatory Visit: Payer: Self-pay | Admitting: *Deleted

## 2024-04-27 NOTE — Telephone Encounter (Signed)
  Chief Complaint: fatigue, elevated blood glucose, medication questions regarding free trials of jardiance  Symptoms: since starting jardiance  blood sugars staying 150's and greater. Not eating as much and blood sugars remain higher than normal. Light headaches at times. Not sleeping and started OTC melatonin with some relief. Taking naps during the day. Feels tired  Frequency: 2 weeks  Pertinent Negatives: Patient denies not drinking enough fluids Disposition: [] ED /[] Urgent Care (no appt availability in office) / [] Appointment(In office/virtual)/ []  [facility]/ [] Home Care/ [] Refused Recommended Disposition /[] [facility]/ [x]  Follow-up with PCP Additional Notes:   Earliest appt jun10. Patient reports she has not received call from pharmacist in office for management of medications. Please advise if PCP will want her to continue jardiance  and if so she will need Rx and to check with her insurance for pricing. Do you want to increase Ozempic  from 1 mg to 2 mg? Please advise.     Copied from CRM [id number]. Topic: Clinical - Red Word Triage >> Apr 27, 2024  4:09 PM Elle L wrote: Red Word that prompted transfer to Nurse Triage: The patient was given a trial of Jardiance  and it is not working. The highest her blood sugar has been is 214 and the lowest she has gotten it is 124. She has extreme fatigue and she had diarrhea but took imodium that helped. Reason for Disposition  [1] MILD weakness (i.e., does not interfere with ability to work, go to school, normal activities) AND [2] persists > 1 week  Answer Assessment - Initial Assessment Questions 1. DESCRIPTION: "Describe how you are feeling."     Fatigued and taking melatonin OTC and helps some. Blood sugar elevated  2. SEVERITY: "How bad is it?"  "Can you stand and walk?"   - MILD (0-3): Feels weak or tired, but does not interfere with work, school or normal activities.   - MODERATE (4-7): Able to stand and walk; weakness  interferes with work, school, or normal activities.   - SEVERE (8-10): Unable to stand or walk; unable to do usual activities.     Can do normal activities but afternoons lays down to sleep 3. ONSET: "When did these symptoms begin?" (e.g., hours, days, weeks, months)     After starting jardiance   4. CAUSE: "What do you think is causing the weakness or fatigue?" (e.g., not drinking enough fluids, medical problem, trouble sleeping)     Not sure , trouble sleeping  5. NEW MEDICINES:  "Have you started on any new medicines recently?" (e.g., opioid pain medicines, benzodiazepines, muscle relaxants, antidepressants, antihistamines, neuroleptics, beta blockers)     Jardiance   6. OTHER SYMPTOMS: "Do you have any other symptoms?" (e.g., chest pain, fever, cough, SOB, vomiting, diarrhea, bleeding, other areas of pain)     Not sleeping without sleep aid melatonin, blood sugars >150's. Light headache at times.  7. PREGNANCY: "Is there any chance you are pregnant?" "When was your last menstrual period?"     na  Protocols used: Weakness (Generalized) and Fatigue-A-AH

## 2024-04-28 ENCOUNTER — Other Ambulatory Visit: Payer: Self-pay | Admitting: Family Medicine

## 2024-05-02 ENCOUNTER — Other Ambulatory Visit (HOSPITAL_COMMUNITY): Payer: Self-pay

## 2024-05-02 ENCOUNTER — Telehealth: Payer: Self-pay

## 2024-05-02 ENCOUNTER — Other Ambulatory Visit: Payer: Self-pay | Admitting: Family Medicine

## 2024-05-02 ENCOUNTER — Other Ambulatory Visit: Payer: Self-pay

## 2024-05-02 MED ORDER — ICOSAPENT ETHYL 1 G PO CAPS
2.0000 g | ORAL_CAPSULE | Freq: Two times a day (BID) | ORAL | 1 refills | Status: DC
  Filled 2024-05-02: qty 360, 90d supply, fill #0
  Filled 2024-08-02 – 2024-08-04 (×2): qty 360, 90d supply, fill #1

## 2024-05-02 NOTE — Progress Notes (Signed)
   05/02/2024  Patient ID: Rachel Hensley, female   DOB: May 07, 1956, 68 y.o.   MRN: [id number]  Contacted patient regarding referral for diabetes and medication access from Mercy Stall, MD .   Appointment scheduled   Future Appointments  Date Time Provider Department Center  05/03/2024  9:00 AM [facility] None  05/05/2024  3:30 PM Clarnce Crow, RN [facility] None  05/15/2024  8:30 AM [facility] None  05/15/2024  9:00 AM Deloria Fetch, MD [facility] None  06/06/2024  8:20 AM Mercy Stall, MD [facility] None  07/06/2024  3:10 PM [facility]ANNUAL WELLNESS VISIT [facility] None     Rolando Cliche, PharmD, BCGP Clinical Pharmacist  [phone]

## 2024-05-03 ENCOUNTER — Telehealth: Payer: Self-pay

## 2024-05-03 ENCOUNTER — Other Ambulatory Visit (HOSPITAL_COMMUNITY): Payer: Self-pay

## 2024-05-03 ENCOUNTER — Ambulatory Visit

## 2024-05-03 DIAGNOSIS — E1121 Type 2 diabetes mellitus with diabetic nephropathy: Secondary | ICD-10-CM

## 2024-05-03 NOTE — Telephone Encounter (Signed)
 Faxed completed change of prescription form to [facility] for Ozempic  increase

## 2024-05-03 NOTE — Progress Notes (Signed)
 05/03/2024 Name: Rachel Hensley MRN: [id number] DOB: 06-24-1956  Chief Complaint  Patient presents with   Medication Management    Rachel Hensley is a 68 y.o. year old female who was referred for medication management by their primary care provider, Cox, Kirsten, MD. They presented for a face to face visit today.   They were referred to the pharmacist by their PCP for assistance in managing diabetes   PAP for Ozempic  1mg , open to changing to two. No sure what happened with Eliquis  PAP but reports was denied. Approved for Synjardy  for this year however she was recently changed to Jardiance  and now will need PAP for this.   Subjective:  Care Team: Primary Care Provider: Mercy Stall, MD ; Next Scheduled Visit:  Future Appointments  Date Time Provider Department Center  05/05/2024  3:30 PM Clarnce Crow, RN [facility] None  05/15/2024  8:30 AM [facility] None  05/15/2024  9:00 AM Deloria Fetch, MD [facility] None  05/18/2024  2:00 PM Rolando Cliche, [facility] None  06/06/2024  8:20 AM Mercy Stall, MD [facility] None  07/06/2024  3:10 PM [facility]-ANNUAL WELLNESS VISIT [facility] None    Medication Access/Adherence  Current Pharmacy:  [facility] [address], [place] - [address] [place] [address] Phone: [phone] Fax: [phone]  [facility] - [place], [place] - [address] [place] [address] Phone: [phone] Fax: [phone]  Melodee Spruce [facility] - [facility] [address] [place] [address] Phone: [phone] Fax: [phone]  [facility] - [place], [place] - [address] [place] [address] Phone: [phone] Fax: [phone]  [facility] - [place], [place] - [address] [place] [address] Phone: [phone] Fax: [phone]   Patient reports affordability concerns with their medications: Yes  Patient  reports access/transportation concerns to their pharmacy: No  Patient reports adherence concerns with their medications:  Yes  Cost as major issue.    Diabetes:  Current medications:  Medications tried in the past:   Current glucose readings: about 150s-200s.  Using glucose meter; testing 2x times daily   Observed patterns:  Patient denies hypoglycemic s/sx including dizziness, shakiness, sweating. Patient denies hyperglycemic symptoms including polyuria, polydipsia, polyphagia, nocturia, neuropathy, blurred vision.  Current meal patterns:  - Breakfast: hard boiled egg, cereal - Lunch half a sandwich - Supper sandwich or salad - Snacks: - Drinks water, zero sugar grape juice.   Current physical activity: limited due leg/hip pain   Current medication access support:    Objective:  Lab Results  Component Value Date   HGBA1C 6.0 (H) 03/02/2024    Lab Results  Component Value Date   CREATININE 0.83 03/02/2024   BUN 24 03/02/2024   NA 145 (H) 03/02/2024   K 4.7 03/02/2024   CL 103 03/02/2024   CO2 22 03/02/2024    Lab Results  Component Value Date   CHOL 130 03/02/2024   HDL 41 03/02/2024   LDLCALC 60 03/02/2024   TRIG 172 (H) 03/02/2024   CHOLHDL 3.2 03/02/2024    Medications Reviewed Today     Reviewed by Rolando Cliche, [facility] (Pharmacist) on 05/03/24 at 1039  Med List Status: <None>   Medication Order Taking? Sig Documenting Provider Last Dose Status Informant  apixaban  (ELIQUIS ) 5 MG TABS tablet [id number]  Take 1 tablet (5 mg total) by mouth 2 (two)  times daily. Cox, Kirsten, MD  Active   atorvastatin  (LIPITOR) 20 MG tablet [id number]  Take 1 tablet by mouth once daily Cox, Kirsten, MD  Active   cetirizine (ZYRTEC) 10 MG tablet [id number]  Take 10 mg by mouth daily. [provider]  Active   diltiazem (CARDIZEM) 30 MG tablet [id number]  Take 30 mg by mouth every 6 (six) hours as needed (HR). [provider]  Active   empagliflozin  (JARDIANCE )  25 MG TABS tablet [id number] Yes Take 25 mg by mouth daily. Take one tablet by mouth daily Cox, Kirsten, MD  Active   Empagliflozin -metFORMIN  HCl ER (SYNJARDY  XR) 12.04-999 MG TB24 [id number] No Take 1 tablet by mouth daily.  Patient not taking: Reported on 05/03/2024   [provider] Not Taking Consider Medication Status and Discontinue   EPINEPHrine  0.3 mg/0.3 mL IJ SOAJ injection [id number]  Inject 0.3 mg into the muscle once. [provider]  Active   flecainide  (TAMBOCOR ) 50 MG tablet [id number]  Take 1 tablet (50 mg total) by mouth 2 (two) times daily. Krasowski, Robert J, MD  Active   glimepiride  (AMARYL ) 4 MG tablet [id number] No Take 1 tablet by mouth twice daily  Patient not taking: Reported on 05/03/2024   Mercy Stall, MD Not Taking Consider Medication Status and Discontinue   icosapent  Ethyl (VASCEPA ) 1 g capsule [id number]  Take 2 capsules (2 g total) by mouth 2 (two) times daily. Cox, Kirsten, MD  Active   magnesium oxide (MAG-OX) 400 (240 Mg) MG tablet [id number]  Take 800 mg by mouth 2 (two) times daily. [provider]  Active   metoprolol  succinate (TOPROL -XL) 50 MG 24 hr tablet [id number]  TAKE 1 TABLET BY MOUTH ONCE DAILY.  TAKE WITH OR IMMEDIATELY FOLLOWING A MEAL  Patient taking differently: Take 50 mg by mouth daily.   Krasowski, Robert J, MD  Active   omeprazole  [facility]) 20 MG capsule [id number]  Take 1 capsule by mouth twice daily Cox, Kirsten, MD  Active   ondansetron  (ZOFRAN  ODT) 4 MG disintegrating tablet [id number]  Take 1 tablet (4 mg total) by mouth every 8 (eight) hours as needed for nausea or vomiting. Allegra Arch, NP  Active            Med Note Burley Carpenter, LISA   Mon Apr 03, 2024  2:56 PM) Has on hand, not needed  promethazine  (PHENERGAN ) 25 MG tablet [id number]  Take 1 tablet (25 mg total) by mouth every 8 (eight) hours as needed for nausea or vomiting. Allegra Arch, NP  Active            Med Note Burley Carpenter, LISA   Mon Apr 03, 2024  2:57  PM) Has on hand, has not needed  Semaglutide , 1 MG/DOSE, 4 MG/3ML SOPN [id number]  Inject 1 mg as directed once a week.  Patient taking differently: Inject 2 mg as directed once a week.   Cox, Kirsten, MD  Active            Assessment/Plan:   Diabetes: - Currently controlled - Reviewed long term cardiovascular and renal outcomes of uncontrolled blood sugar - Reviewed goal A1c, goal fasting, and goal 2 hour post prandial glucose - Reviewed dietary modifications including portion control, low carb diet.  - Reviewed lifestyle modifications including:   - Patient denies personal or family history of multiple endocrine neoplasia type 2, medullary thyroid  cancer; personal history of pancreatitis or gallbladder disease. - Recommend to check glucose twice daily  Follow Up  Plan: Pharmacist to send dose change form for Ozempic  2mg , send in Jardiance  25mg  to PAP pharmacy. Contact [facility] to determine next steps.   -Also discussed PPI taper - will drop omeprazole  dose to 20mg  daily for 1-2 weeks, then 20mg  every other day for a week, then stop.   Rolando Cliche, PharmD, BCGP Clinical Pharmacist  [phone]

## 2024-05-05 ENCOUNTER — Other Ambulatory Visit: Payer: Self-pay

## 2024-05-05 MED ORDER — EMPAGLIFLOZIN 25 MG PO TABS: 25.0000 mg | ORAL_TABLET | Freq: Every day | ORAL | 3 refills | Status: AC

## 2024-05-05 NOTE — Patient Outreach (Signed)
 Complex Care Management   Visit Note  05/05/2024  Name:  Rachel Hensley MRN: [id number] DOB: [birth year]  Situation: Referral received for Complex Care Management related to Diabetes with Complications and HLD I obtained verbal consent from Patient.  Visit completed with patient  on the phone  Background:   Past Medical History:  Diagnosis Date   A-fib [facility])    Angioedema 04/05/2018   GERD (gastroesophageal reflux disease)    History of migraine    Hyperlipidemia    Hypertension    Hypoglycemia due to type 2 diabetes mellitus (HCC) 08/21/2020   Mixed hyperlipidemia    Nontoxic multinodular goiter    Other vitamin B12 deficiency anemias    Primary insomnia 05/20/2020   Solitary pulmonary nodule    Type 2 diabetes mellitus with other specified complication (HCC)     Assessment: Patient Reported Symptoms:  Cognitive Cognitive Status: Alert and oriented to person, place, and time      Neurological Neurological Review of Symptoms: No symptoms reported    HEENT HEENT Symptoms Reported: No symptoms reported      Cardiovascular Cardiovascular Symptoms Reported: No symptoms reported Does patient have uncontrolled Hypertension?: Yes Is patient checking Blood Pressure at home?: Yes Patient's Recent BP reading at home: doesnt remember but states it was normal Cardiovascular Conditions: Hypertension, High blood cholesterol Cardiovascular Management Strategies: Medication therapy, Routine screening, Diet modification Weight: 142 lb (64.4 kg)  Respiratory Respiratory Symptoms Reported: No symptoms reported    Endocrine Patient reports the following symptoms related to hypoglycemia or hyperglycemia : No symptoms reported Is patient diabetic?: Yes Is patient checking blood sugars at home?: Yes Endocrine Conditions: Diabetes Endocrine Management Strategies: Medical device, Weight management, Medication therapy, Routine screening, Diet modification Endocrine Self-Management Outcome:  3 (uncertain)  Gastrointestinal Gastrointestinal Symptoms Reported: Diarrhea, Abdominal pain or discomfort (recently changed DM meds, working with clinical pharmacy) Gastrointestinal Conditions: Diarrhea Gastrointestinal Management Strategies: Adequate rest, Diet modification, Coping strategies, Medication therapy, Fluid modification Gastrointestinal Self-Management Outcome: 2 (bad) Nutrition Risk Screen (CP): No indicators present  Genitourinary Genitourinary Symptoms Reported: No symptoms reported    Integumentary Integumentary Symptoms Reported: No symptoms reported    Musculoskeletal Musculoskelatal Symptoms Reviewed: Difficulty walking Additional Musculoskeletal Details: osteoarthritis bilateral knees and back  recent cortisone injection in knees.  lumbar disc protrusion Musculoskeletal Conditions: Joint pain Musculoskeletal Management Strategies: Adequate rest, Exercise, Medication therapy, Routine screening, Weight management Falls in the past year?: No Number of falls in past year: 1 or less    Psychosocial Psychosocial Symptoms Reported: No symptoms reported     Quality of Family Relationships: helpful, involved, supportive Do you feel physically threatened by others?: No      04/13/2024   11:18 AM  Depression screen PHQ 2/9  Decreased Interest 0  Down, Depressed, Hopeless 0  PHQ - 2 Score 0    There were no vitals filed for this visit.  Medications Reviewed Today     Reviewed by Clarnce Crow, RN (Registered Nurse) on 05/05/24 at 1548  Med List Status: <None>   Medication Order Taking? Sig Documenting Provider Last Dose Status Informant  apixaban  (ELIQUIS ) 5 MG TABS tablet [id number] Yes Take 1 tablet (5 mg total) by mouth 2 (two) times daily. Cox, Kirsten, MD Taking Active   atorvastatin  (LIPITOR) 20 MG tablet [id number] Yes Take 1 tablet by mouth once daily Cox, Kirsten, MD Taking Active   cetirizine (ZYRTEC) 10 MG tablet [id number] Yes Take 10 mg by mouth daily.  [provider] Taking Active  diltiazem (CARDIZEM) 30 MG tablet [id number] Yes Take 30 mg by mouth every 6 (six) hours as needed (HR). [provider] Taking Active   empagliflozin  (JARDIANCE ) 25 MG TABS tablet [id number] Yes Take 1 tablet (25 mg total) by mouth daily. Take one tablet by mouth daily Cox, Kirsten, MD Taking Active   Empagliflozin -metFORMIN  HCl ER (SYNJARDY  XR) 12.04-999 MG TB24 [id number] No Take 1 tablet by mouth daily.  Patient not taking: Reported on 05/05/2024   [provider] Not Taking Active   EPINEPHrine  0.3 mg/0.3 mL IJ SOAJ injection [id number] Yes Inject 0.3 mg into the muscle once. [provider] Taking Active   flecainide  (TAMBOCOR ) 50 MG tablet [id number] Yes Take 1 tablet (50 mg total) by mouth 2 (two) times daily. Krasowski, Robert J, MD Taking Active   glimepiride  (AMARYL ) 4 MG tablet [id number] No Take 1 tablet by mouth twice daily  Patient not taking: Reported on 05/05/2024   Mercy Stall, MD Not Taking Active   icosapent  Ethyl (VASCEPA ) 1 g capsule [id number] Yes Take 2 capsules (2 g total) by mouth 2 (two) times daily. Cox, Kirsten, MD Taking Active   magnesium oxide (MAG-OX) 400 (240 Mg) MG tablet [id number] Yes Take 800 mg by mouth 2 (two) times daily. [provider] Taking Active   metoprolol  succinate (TOPROL -XL) 50 MG 24 hr tablet [id number] Yes TAKE 1 TABLET BY MOUTH ONCE DAILY.  TAKE WITH OR IMMEDIATELY FOLLOWING A MEAL  Patient taking differently: Take 50 mg by mouth daily.   Krasowski, Robert J, MD Taking Active   omeprazole  [facility]) 20 MG capsule [id number] Yes Take 1 capsule by mouth twice daily Cox, Kirsten, MD Taking Active            Med Note Burley Carpenter, Syrina Wake   Fri May 05, 2024  3:47 PM) Down titrating as pre provider instruction  ondansetron  (ZOFRAN  ODT) 4 MG disintegrating tablet [id number] Yes Take 1 tablet (4 mg total) by mouth every 8 (eight) hours as needed for nausea or vomiting. Allegra Arch, NP  Taking Active            Med Note Burley Carpenter, Valeen Borys   Mon Apr 03, 2024  2:56 PM) Has on hand, not needed  promethazine  (PHENERGAN ) 25 MG tablet [id number] Yes Take 1 tablet (25 mg total) by mouth every 8 (eight) hours as needed for nausea or vomiting. Allegra Arch, NP Taking Active            Med Note Burley Carpenter, Tyrie Porzio   Mon Apr 03, 2024  2:57 PM) Has on hand, has not needed  Semaglutide , 1 MG/DOSE, 4 MG/3ML SOPN [id number] Yes Inject 1 mg as directed once a week.  Patient taking differently: Inject 2 mg as directed once a week.   Mercy Stall, MD Taking Active             Recommendation:   PCP Follow-up  Follow Up Plan:   Telephone follow up appointment date/time:  05/19/24 at 9:00   Clarnce Crow BSN RN CCM [place]  [facility], [facility] RN Care Manager Direct Dial: [phone] Fax: [phone]

## 2024-05-05 NOTE — Patient Instructions (Signed)
 Visit Information  Thank you for taking time to visit with me today. Please don't hesitate to contact me if I can be of assistance to you before our next scheduled appointment.  Our next appointment is by telephone on 05/19/24 at 9:00 Please call the care guide team at [phone] if you need to cancel or reschedule your appointment.   Following is a copy of your care plan:   Goals Addressed             This Visit's Progress    VBCI RN Care Plan   On track    Problems:  Chronic Disease Management support and education needs related to DMII  Goal: Over the next 3 months the Patient will attend all scheduled medical appointments: 05/15/24 Lab/Onc, 05/18/24 Pharmacy, 06/06/24 PCP as evidenced by chart review and patient repoirt        continue to work with RN Care Manager and/or Social Worker to address care management and care coordination needs related to DMII as evidenced by adherence to CM Team Scheduled appointments     demonstrate a decrease DMII in exacerbations as evidenced by controlled blood sugars within normal ranges  Interventions:   Diabetes Interventions:  (Status:  Goal on track:  Yes.) Long Term Goal Assessed patient's understanding of A1c goal: <6.5% Provided education to patient about basic DM disease process Reviewed medications with patient and discussed importance of medication adherence Counseled on importance of regular laboratory monitoring as prescribed Discussed plans with patient for ongoing care management follow up and provided patient with direct contact information for care management team Reviewed scheduled/upcoming provider appointments including: 5/23 Pharmacy  Lab 05/15/24, PCP 06/06/24 Advised patient, providing education and rationale, to check cbg daily prior to breakfast and record, calling PCP for findings outside established parameters Lab Results  Component Value Date   HGBA1C 6.0 (H) 03/02/2024    Patient Self-Care Activities:  Attend all  scheduled provider appointments Call pharmacy for medication refills 3-7 days in advance of running out of medications Call provider office for new concerns or questions  Perform all self care activities independently  Take medications as prescribed    Plan:  Telephone follow up appointment with care management team member scheduled for:  05/19/24 at 9:00             Please call the Suicide and Crisis Lifeline: 988 call the [place]  National Suicide Prevention Lifeline: (408)813-8628 or TTY: 661 362 8571 TTY 747-132-1330) to talk to a trained counselor call 1-800-273-TALK (toll free, 24 hour hotline) if you are experiencing a Mental Health or Behavioral Health Crisis or need someone to talk to.  Patient verbalizes understanding of instructions and care plan provided today and agrees to view in MyChart. Active MyChart status and patient understanding of how to access instructions and care plan via MyChart confirmed with patient.      Clarnce Crow BSN RN CCM [place]  [facility], [facility] RN Care Manager Direct Dial: [phone] Fax: [phone]

## 2024-05-12 ENCOUNTER — Telehealth: Payer: Self-pay

## 2024-05-12 NOTE — Telephone Encounter (Signed)
 Copied from CRM [id number]. Topic: Clinical - Medication Question >> May 12, 2024 12:00 PM [name] wrote: Reason for CRM: Blood sugar runs high on empagliflozin  (JARDIANCE ) 25 MG TABS tablet, ran out so she took glimepiride  (AMARYL ) 4 MG tablet and now her blood sugar is normal range- please call to advise which meds she should take [phone]

## 2024-05-14 NOTE — Progress Notes (Signed)
 [facility]  [address] [place],  [place]  [address] [phone]  Clinic Day:  05/15/2024  Referring physician: Mercy Stall, MD   HISTORY OF PRESENT ILLNESS:  The patient is a 68 y.o. female  with iron  deficiency anemia.  She last received IV iron  in April 2024.  She comes in today to reassess her labs.  Since her last visit, the patient has been doing fairly well.  She has had sporadic bouts of rectal bleeding from hemorrhoids.  However, she denies any episode being particularly pronounced.  PHYSICAL EXAM:  Blood pressure 137/68, pulse 68, temperature 97.9 F (36.6 C), temperature source Oral, resp. rate 14, height 5\' 3"  (1.6 m), weight 148 lb 4.8 oz (67.3 kg), SpO2 100%. Wt Readings from Last 3 Encounters:  05/15/24 148 lb 4.8 oz (67.3 kg)  05/05/24 142 lb (64.4 kg)  04/13/24 151 lb 9.6 oz (68.8 kg)   Body mass index is 26.27 kg/m. Performance status (ECOG): 1 - Symptomatic but completely ambulatory Physical Exam Constitutional:      Appearance: Normal appearance. She is not ill-appearing.  HENT:     Mouth/Throat:     Mouth: Mucous membranes are moist.     Pharynx: Oropharynx is clear. No oropharyngeal exudate or posterior oropharyngeal erythema.  Cardiovascular:     Rate and Rhythm: Normal rate and regular rhythm.     Heart sounds: No murmur heard.    No friction rub. No gallop.  Pulmonary:     Effort: Pulmonary effort is normal. No respiratory distress.     Breath sounds: Normal breath sounds. No wheezing, rhonchi or rales.  Abdominal:     General: Bowel sounds are normal. There is no distension.     Palpations: Abdomen is soft. There is no mass.     Tenderness: There is no abdominal tenderness.  Musculoskeletal:        General: No swelling.     Right lower leg: No edema.     Left lower leg: No edema.  Lymphadenopathy:     Cervical: No cervical adenopathy.     Upper Body:     Right upper body: No supraclavicular or axillary  adenopathy.     Left upper body: No supraclavicular or axillary adenopathy.     Lower Body: No right inguinal adenopathy. No left inguinal adenopathy.  Skin:    General: Skin is warm.     Coloration: Skin is not jaundiced.     Findings: No lesion or rash.  Neurological:     General: No focal deficit present.     Mental Status: She is alert and oriented to person, place, and time. Mental status is at baseline.  Psychiatric:        Mood and Affect: Mood normal.        Behavior: Behavior normal.        Thought Content: Thought content normal.    LABS:      Latest Ref Rng & Units 05/15/2024    8:30 AM 03/02/2024    9:23 AM 11/16/2023    9:49 AM  CBC  WBC 4.0 - 10.5 K/uL 8.5  8.6  9.6   Hemoglobin 12.0 - 15.0 g/dL 21.3  08.6  57.8   Hematocrit 36.0 - 46.0 % 43.9  43.5  43.5   Platelets 150 - 400 K/uL 227  263  245       Latest Ref Rng & Units 03/02/2024    9:23 AM 11/16/2023  9:49 AM 07/29/2023    9:16 AM  CMP  Glucose 70 - 99 mg/dL 409  811  914   BUN 8 - 27 mg/dL 24  19  24    Creatinine 0.57 - 1.00 mg/dL 7.82  9.56  2.13   Sodium 134 - 144 mmol/L 145  139  140   Potassium 3.5 - 5.2 mmol/L 4.7  4.4  4.9   Chloride 96 - 106 mmol/L 103  100  101   CO2 20 - 29 mmol/L 22  24  22    Calcium  8.7 - 10.3 mg/dL 9.9  08.6  57.8   Total Protein 6.0 - 8.5 g/dL 7.0  7.4  7.2   Total Bilirubin 0.0 - 1.2 mg/dL 0.4  0.4  0.4   Alkaline Phos 44 - 121 IU/L 91  94  94   AST 0 - 40 IU/L 20  26  21    ALT 0 - 32 IU/L 22  27  22      Latest Reference Range & Units 05/15/24 08:30  Iron  28 - 170 ug/dL 54  UIBC ug/dL 469  TIBC 629 - 528 ug/dL 413  Saturation Ratios 10.4 - 31.8 % 12  Ferritin 11 - 307 ng/mL 101    ASSESSMENT & PLAN:  A 68 y.o. female with iron  deficiency anemia.  I am very pleased as her iron  and hemoglobin levels remain ideal today.  Clinically, the patient appears to be doing well.  As that is the case, I will see her back in 6 months for repeat clinical assessment.  If her  iron  and hemoglobin levels remain normal at that time, I will likely turn her care back over to her primary care office after her next visit.  The patient understands all the plans discussed today and is in agreement with them.  Alyssa Rotondo Felicia Horde, MD

## 2024-05-15 ENCOUNTER — Other Ambulatory Visit: Payer: Self-pay | Admitting: Oncology

## 2024-05-15 ENCOUNTER — Inpatient Hospital Stay: Payer: PPO

## 2024-05-15 ENCOUNTER — Inpatient Hospital Stay: Payer: PPO | Attending: Oncology | Admitting: Oncology

## 2024-05-15 VITALS — BP 137/68 | HR 68 | Temp 97.9°F | Resp 14 | Ht 63.0 in | Wt 148.3 lb

## 2024-05-15 DIAGNOSIS — D508 Other iron deficiency anemias: Secondary | ICD-10-CM

## 2024-05-15 DIAGNOSIS — D509 Iron deficiency anemia, unspecified: Secondary | ICD-10-CM | POA: Insufficient documentation

## 2024-05-15 LAB — IRON AND TIBC
Iron: 54 ug/dL (ref 28–170)
Saturation Ratios: 12 % (ref 10.4–31.8)
TIBC: 442 ug/dL (ref 250–450)
UIBC: 388 ug/dL

## 2024-05-15 LAB — CBC WITH DIFFERENTIAL (CANCER CENTER ONLY)
Abs Immature Granulocytes: 0.02 10*3/uL (ref 0.00–0.07)
Basophils Absolute: 0 10*3/uL (ref 0.0–0.1)
Basophils Relative: 0 %
Eosinophils Absolute: 0.2 10*3/uL (ref 0.0–0.5)
Eosinophils Relative: 2 %
HCT: 43.9 % (ref 36.0–46.0)
Hemoglobin: 15.3 g/dL — ABNORMAL HIGH (ref 12.0–15.0)
Immature Granulocytes: 0 %
Lymphocytes Relative: 41 %
Lymphs Abs: 3.5 10*3/uL (ref 0.7–4.0)
MCH: 32.1 pg (ref 26.0–34.0)
MCHC: 34.9 g/dL (ref 30.0–36.0)
MCV: 92 fL (ref 80.0–100.0)
Monocytes Absolute: 0.7 10*3/uL (ref 0.1–1.0)
Monocytes Relative: 8 %
Neutro Abs: 4.1 10*3/uL (ref 1.7–7.7)
Neutrophils Relative %: 49 %
Platelet Count: 227 10*3/uL (ref 150–400)
RBC: 4.77 MIL/uL (ref 3.87–5.11)
RDW: 13 % (ref 11.5–15.5)
WBC Count: 8.5 10*3/uL (ref 4.0–10.5)
nRBC: 0 % (ref 0.0–0.2)

## 2024-05-15 LAB — FERRITIN: Ferritin: 101 ng/mL (ref 11–307)

## 2024-05-15 NOTE — Telephone Encounter (Signed)
Left message to return call back. 

## 2024-05-16 ENCOUNTER — Telehealth: Payer: Self-pay

## 2024-05-16 NOTE — Telephone Encounter (Signed)
 Spoke with patient informed her we do have samples available for her to pick up (35 days worth). Will also send a message to Derwood Flor regarding the patient assistance.  Copied from CRM [id number]. Topic: General - Call Back - No Documentation >> May 16, 2024  9:01 AM Baldomero Bone wrote: Reason for CRM: Patient is returning a call from Lauren regarding her empagliflozin  (JARDIANCE ) 25 MG TABS tablet. Per CAL, read note to patient. Patient has not received the Jardiance . Patient assistance has not contacted the patient regarding getting any assistance. Does the patient continue taking current medication until she receives the Jardiance ? Patient has been out of the Jardiance  samples since May 14th. Patient has been sick possibly from being taken off heartburn medication. Callback number is [phone]

## 2024-05-17 ENCOUNTER — Telehealth: Payer: Self-pay

## 2024-05-17 NOTE — Telephone Encounter (Signed)
 Left message for patient and stated her ozempic  (4 boxes) is ready for pick up.

## 2024-05-18 ENCOUNTER — Other Ambulatory Visit: Payer: Self-pay

## 2024-05-18 NOTE — Progress Notes (Signed)
 05/18/2024 Name: Rachel Hensley MRN: [id number] DOB: [birth year]  Chief Complaint  Patient presents with   Medication Management   Diabetes    Rachel Hensley is a 68 y.o. year old female who was referred for medication management by their primary care provider, Rachel Hensley. They presented for a face to face visit today.   They were referred to the pharmacist by their PCP for assistance in managing diabetes   PAP for Ozempic  1mg , open to changing to two. No sure what happened with Eliquis  PAP but reports was denied. Approved for Synjardy  for this year however she was recently changed to Jardiance  and now will need PAP for this.   Subjective:  Care Team: Primary Care Provider: Mercy Stall, Hensley ; Next Scheduled Visit:  Future Appointments  Date Time Provider Department Center  05/19/2024  9:00 AM Clarnce Crow, RN [facility] None  06/06/2024  8:20 AM Rachel Stall, Hensley [facility]O None  07/06/2024  3:10 PM Hensley-ANNUAL WELLNESS VISIT Hensley-CFO None  11/15/2024  9:30 AM [facility] None  11/15/2024 10:00 AM Rachel Fetch, Hensley [facility] None    Medication Access/Adherence  Current Pharmacy:  [facility] [address]  - [address] [place] [address] Phone: [phone] Fax: [phone]  [facility] - [place], [place] - [address] [place] [address] Phone: [phone] Fax: [phone]  [facility]ode[facility] Spruce [facility] - [facility] [address] [place] [address] Phone: [phone] Fax: [phone]  [facility] - [place], [place] - [address] [place] [address] Phone: [phone] Fax: [phone]  [facility] - [place], [place] - [address] [place] [address] Phone: [phone] Fax: [phone]   Patient reports affordability concerns with their medications: Yes  Patient reports access/transportation  concerns to their pharmacy: No  Patient reports adherence concerns with their medications:  Yes  Cost as major issue.    Diabetes:  Current medications:  Medications tried in the past:   Current glucose readings: about 150s-200s.  Using glucose meter; testing 2x times daily   Observed patterns:  Patient denies hypoglycemic s/sx including dizziness, shakiness, sweating. Patient denies hyperglycemic symptoms including polyuria, polydipsia, polyphagia, nocturia, neuropathy, blurred vision.  Current meal patterns:  - Breakfast: hard boiled egg, cereal - Lunch half a sandwich - Supper sandwich or salad - Snacks: - Drinks water, zero sugar grape juice.   Current physical activity: limited due leg/hip pain   05/18/2024 As of today, has received ozempic  2mg , recently picked up from the office. Continues to have some GI issues, reflux managed on 20mg  daily of omprazole as opposed to previous 40mg  daily. Had not received Jardiance  patient assistance.    Objective:  Lab Results  Component Value Date   HGBA1C 6.0 (H) 03/02/2024    Lab Results  Component Value Date   CREATININE 0.83 03/02/2024   BUN 24 03/02/2024   NA 145 (H) 03/02/2024   K 4.7 03/02/2024   CL 103 03/02/2024   CO2 22 03/02/2024    Lab Results  Component Value Date   CHOL 130 03/02/2024   HDL 41 03/02/2024   LDLCALC 60 03/02/2024   TRIG 172 (H) 03/02/2024   CHOLHDL 3.2 03/02/2024    Medications Reviewed Today     Reviewed by Rachel Hensley, [facility] (Pharmacist) on 05/18/24 at 1435  Med List Status: <None>   Medication Order  Taking? Sig Documenting Provider Last Dose Status Informant  apixaban  (ELIQUIS ) 5 MG TABS tablet [id number] No Take 1 tablet (5 mg total) by mouth 2 (two) times daily. Rachel Hensley Taking Active   atorvastatin  (LIPITOR) 20 MG tablet [id number] No Take 1 tablet by mouth once daily Rachel Hensley Taking Active   cetirizine (ZYRTEC) 10 MG tablet [id number] No Take 10 mg by mouth daily.  Provider, Historical, Hensley Taking Active   diltiazem (CARDIZEM) 30 MG tablet [id number] No Take 30 mg by mouth every 6 (six) hours as needed (HR). Provider, Historical, Hensley Taking Active   empagliflozin  (JARDIANCE ) 25 MG TABS tablet [id number] No Take 1 tablet (25 mg total) by mouth daily. Take one tablet by mouth daily Rachel Hensley Taking Active   Empagliflozin -metFORMIN  HCl ER (SYNJARDY  XR) 12.04-999 MG TB24 [id number] No Take 1 tablet by mouth daily.  Patient not taking: Reported on 05/05/2024   Provider, Historical, Hensley Not Taking Active   EPINEPHrine  0.3 mg/0.3 mL IJ SOAJ injection [id number] No Inject 0.3 mg into the muscle once. Provider, Historical, Hensley Taking Active   flecainide  (TAMBOCOR ) 50 MG tablet [id number] No Take 1 tablet (50 mg total) by mouth 2 (two) times daily. Krasowski, Robert J, Hensley Taking Active   glimepiride  (AMARYL ) 4 MG tablet [id number] No Take 1 tablet by mouth twice daily  Patient not taking: Reported on 05/05/2024   Rachel Stall, Hensley Not Taking Active   icosapent  Ethyl (VASCEPA ) 1 g capsule [id number] No Take 2 capsules (2 g total) by mouth 2 (two) times daily. Rachel Hensley Taking Active   magnesium oxide (MAG-OX) 400 (240 Mg) MG tablet [id number] No Take 800 mg by mouth 2 (two) times daily. Provider, Historical, Hensley Taking Active   metoprolol  succinate (TOPROL -XL) 50 MG 24 hr tablet [id number] No TAKE 1 TABLET BY MOUTH ONCE DAILY.  TAKE WITH OR IMMEDIATELY FOLLOWING A MEAL  Patient taking differently: Take 50 mg by mouth daily.   Krasowski, Robert J, Hensley Taking Active   omeprazole  [facility]) 20 MG capsule [id number] No Take 1 capsule by mouth twice daily Rachel Hensley Taking Active            Med Note Burley Carpenter, LISA   Fri May 05, 2024  3:47 PM) Down titrating as pre provider instruction  ondansetron  (ZOFRAN  ODT) 4 MG disintegrating tablet [id number] No Take 1 tablet (4 mg total) by mouth every 8 (eight) hours as needed for nausea or vomiting. Allegra Arch, NP Taking Active             Med Note Burley Carpenter, LISA   Mon Apr 03, 2024  2:56 PM) Has on hand, not needed  promethazine  (PHENERGAN ) 25 MG tablet [id number] No Take 1 tablet (25 mg total) by mouth every 8 (eight) hours as needed for nausea or vomiting. Allegra Arch, NP Taking Active            Med Note Burley Carpenter, LISA   Mon Apr 03, 2024  2:57 PM) Has on hand, has not needed  Semaglutide , 1 MG/DOSE, 4 MG/3ML SOPN [id number] No Inject 1 mg as directed once a week.  Patient taking differently: Inject 2 mg as directed once a week.   Rachel Hensley Taking Active            Assessment/Plan:   Diabetes: - Currently controlled - Reviewed long term cardiovascular and renal outcomes of uncontrolled blood sugar - Reviewed goal A1c, goal fasting, and goal 2 hour post prandial glucose -  Reviewed dietary modifications including portion control, low carb diet.  - Reviewed lifestyle modifications including:   - Patient denies personal or family history of multiple endocrine neoplasia type 2, medullary thyroid  cancer; personal history of pancreatitis or gallbladder disease. - Recommend to check glucose twice daily  Follow Up Plan: Pharmacist to send dose change form for Ozempic  2mg , send in Jardiance  25mg  to PAP pharmacy. Contact [facility] to determine next steps.   -Also discussed PPI taper - will drop omeprazole  dose to 20mg  daily for 1-2 weeks, then 20mg  every other day for a week, then stop.   05/18/2024 F/U - reviewed PAP and recent glucose readings. <200. Now has ozempic  2mg  through PAP - contacted [facility] d/t pt not hearing anything back from Jardiance  PAP, stated pt should receive new order within 1 wk. Was on hold d/t recently receiving synjardy . Patient made aware Jardiance  order is in process.  - continue on omeprazole  20mg  daily, consider further reduction of reflux symptoms stabilize.   Rachel Hensley, PharmD, BCGP Clinical Pharmacist  [phone]

## 2024-05-18 NOTE — Telephone Encounter (Signed)
 PATIENT PICKED UP PATIENT ASSISTANCE.

## 2024-05-19 ENCOUNTER — Other Ambulatory Visit: Payer: Self-pay

## 2024-05-19 NOTE — Patient Outreach (Signed)
 Complex Care Management   Visit Note  05/19/2024  Name:  Rachel Hensley MRN: [id number] DOB: [birth year]  Situation: Referral received for Complex Care Management related to Diabetes with Complications I obtained verbal consent from Patient.  Visit completed with patient  on the phone  Background:   Past Medical History:  Diagnosis Date   A-fib [facility])    Angioedema 04/05/2018   GERD (gastroesophageal reflux disease)    History of migraine    Hyperlipidemia    Hypertension    Hypoglycemia due to type 2 diabetes mellitus (HCC) 08/21/2020   Mixed hyperlipidemia    Nontoxic multinodular goiter    Other vitamin B12 deficiency anemias    Primary insomnia 05/20/2020   Solitary pulmonary nodule    Type 2 diabetes mellitus with other specified complication (HCC)     Assessment: Patient Reported Symptoms:  Cognitive Cognitive Status: Alert and oriented to person, place, and time      Neurological Neurological Review of Symptoms: Numbness (numbness left thigh when walking, MRI results of lumbar spine show L3-4 disc protrusion) Neurological Management Strategies: Adequate rest, Medication therapy, Routine screening, Exercise Neurological Comment: patient reports improvement with HEP,  HEENT HEENT Symptoms Reported: No symptoms reported      Cardiovascular Cardiovascular Symptoms Reported: No symptoms reported Does patient have uncontrolled Hypertension?: Yes Is patient checking Blood Pressure at home?: Yes Patient's Recent BP reading at home: 130/72 on 5/17 Cardiovascular Conditions: Hypertension Cardiovascular Management Strategies: Medication therapy  Respiratory Respiratory Symptoms Reported: No symptoms reported    Endocrine Patient reports the following symptoms related to hypoglycemia or hyperglycemia : No symptoms reported Is patient diabetic?: Yes Is patient checking blood sugars at home?: Yes Endocrine Conditions: Diabetes Endocrine Management Strategies: Weight  management, Medical device, Medication therapy, Routine screening, Diet modification, Exercise  Gastrointestinal Gastrointestinal Symptoms Reported: Diarrhea Additional Gastrointestinal Details: occasional diarrhea, has improved over the last week Gastrointestinal Conditions: Diarrhea Gastrointestinal Management Strategies: Fluid modification, Diet modification, Exercise, Medication therapy Nutrition Risk Screen (CP): No indicators present  Genitourinary Genitourinary Symptoms Reported: No symptoms reported    Integumentary Integumentary Symptoms Reported: No symptoms reported    Musculoskeletal Musculoskelatal Symptoms Reviewed: Other Other Musculoskeletal Symptoms: patient reports recent broken left ring finger, seen [facility]/13/24 at [facility] for [facility]ray, chipped bone joint, unsure of injury, currently wearing a splint, has declined to see ortho for follow up, states swelling and pain has improved with time Additional Musculoskeletal Details: patient report some improvement in back pain started HEP Musculoskeletal Conditions: Back pain, Joint pain Musculoskeletal Management Strategies: Adequate rest, Medication therapy, Routine screening, Exercise Falls in the past year?: No    Psychosocial Psychosocial Symptoms Reported: No symptoms reported            04/13/2024   11:18 AM  Depression screen PHQ 2/9  Decreased Interest 0  Down, Depressed, Hopeless 0  PHQ - 2 Score 0    There were no vitals filed for this visit.  Medications Reviewed Today     Reviewed by Clarnce Crow, RN (Registered Nurse) on 05/19/24 at 0920  Med List Status: <None>   Medication Order Taking? Sig Documenting Provider Last Dose Status Informant  apixaban  (ELIQUIS ) 5 MG TABS tablet [id number] Yes Take 1 tablet (5 mg total) by mouth 2 (two) times daily. Cox, Kirsten, MD Taking Active   atorvastatin  (LIPITOR) 20 MG tablet [id number] Yes Take 1 tablet by mouth once daily Cox, Kirsten, MD Taking Active   cetirizine (ZYRTEC) 10  MG tablet [id number] Yes Take 10 mg  by mouth daily. [provider] Taking Active   diltiazem (CARDIZEM) 30 MG tablet [id number] Yes Take 30 mg by mouth every 6 (six) hours as needed (HR). [provider] Taking Active   empagliflozin  (JARDIANCE ) 25 MG TABS tablet [id number]  Take 1 tablet (25 mg total) by mouth daily. Take one tablet by mouth daily Cox, Kirsten, MD  Active   Empagliflozin -metFORMIN  HCl ER (SYNJARDY  XR) 12.04-999 MG TB24 [id number] No Take 1 tablet by mouth daily.  Patient not taking: Reported on 05/03/2024   [provider] Not Taking Consider Medication Status and Discontinue (Discontinued by provider)   EPINEPHrine  0.3 mg/0.3 mL IJ SOAJ injection [id number] Yes Inject 0.3 mg into the muscle once. [provider] Taking Active   flecainide  (TAMBOCOR ) 50 MG tablet [id number] Yes Take 1 tablet (50 mg total) by mouth 2 (two) times daily. Krasowski, Robert J, MD Taking Active   glimepiride  (AMARYL ) 4 MG tablet [id number]  Take 1 tablet by mouth twice daily  Patient not taking: Reported on 05/03/2024   Mercy Stall, MD  Consider Medication Status and Discontinue (Discontinued by provider)   icosapent  Ethyl (VASCEPA ) 1 g capsule [id number] Yes Take 2 capsules (2 g total) by mouth 2 (two) times daily. Cox, Kirsten, MD Taking Active   magnesium oxide (MAG-OX) 400 (240 Mg) MG tablet [id number] Yes Take 800 mg by mouth 2 (two) times daily. [provider] Taking Active   metoprolol  succinate (TOPROL -XL) 50 MG 24 hr tablet [id number] Yes TAKE 1 TABLET BY MOUTH ONCE DAILY.  TAKE WITH OR IMMEDIATELY FOLLOWING A MEAL  Patient taking differently: Take 50 mg by mouth daily.   Krasowski, Robert J, MD Taking Active   omeprazole  [facility]) 20 MG capsule [id number]  Take 1 capsule by mouth twice daily Cox, Kirsten, MD  Active            Med Note Burley Carpenter, Morene Cecilio   Fri May 19, 2024  9:19 AM) Taking 1 per day for next 3 days, then 1 every other day for one week, then stop  as long as symptoms dont return.  ondansetron  (ZOFRAN  ODT) 4 MG disintegrating tablet [id number] Yes Take 1 tablet (4 mg total) by mouth every 8 (eight) hours as needed for nausea or vomiting. Allegra Arch, NP Taking Active            Med Note Burley Carpenter, Neri Samek   Mon Apr 03, 2024  2:56 PM) Has on hand, not needed  promethazine  (PHENERGAN ) 25 MG tablet [id number] Yes Take 1 tablet (25 mg total) by mouth every 8 (eight) hours as needed for nausea or vomiting. Allegra Arch, NP Taking Active            Med Note Burley Carpenter, Trea Latner   Mon Apr 03, 2024  2:57 PM) Has on hand, has not needed  Semaglutide , 1 MG/DOSE, 4 MG/3ML SOPN [id number]  Inject 1 mg as directed once a week.  Patient taking differently: Inject 2 mg as directed once a week.   Mercy Stall, MD  Active             Recommendation:   PCP Follow-up visit scheduled for 06/06/24  Follow Up Plan:   Telephone follow up appointment date/time:  06/19/24   Clarnce Crow BSN RN CCM [place]  [facility], [facility] RN Care Manager Direct Dial: [phone] Fax: [phone]

## 2024-05-19 NOTE — Patient Instructions (Signed)
 Visit Information  Thank you for taking time to visit with me today. Please don't hesitate to contact me if I can be of assistance to you before our next scheduled appointment.  Our next appointment is by telephone on 06/19/24 at 10:00 Please call the care guide team at [phone] if you need to cancel or reschedule your appointment.     Following is a copy of your care plan:   Goals Addressed             This Visit's Progress    VBCI RN Care Plan   On track    Problems:  Chronic Disease Management support and education needs related to DMII  Goal: Over the next 3 months the Patient will attend all scheduled medical appointments: AWV 07/06/24 06/06/24 PCP as evidenced by chart review and patient repoirt        continue to work with RN Care Manager and/or Social Worker to address care management and care coordination needs related to DMII as evidenced by adherence to CM Team Scheduled appointments     demonstrate a decrease DMII in exacerbations as evidenced by controlled blood sugars within normal ranges  Interventions:   Diabetes Interventions:  (Status:  Goal on track:  Yes.) Long Term Goal Assessed patient's understanding of A1c goal: <6.5% Provided education to patient about basic DM disease process Reviewed medications with patient and discussed importance of medication adherence Counseled on importance of regular laboratory monitoring as prescribed Discussed plans with patient for ongoing care management follow up and provided patient with direct contact information for care management team Reviewed scheduled/upcoming provider appointments including:  AWV 07/06/24 PCP 06/06/24 Advised patient, providing education and rationale, to check cbg daily prior to breakfast and record, calling PCP for findings outside established parameters Lab Results  Component Value Date   HGBA1C 6.0 (H) 03/02/2024    Patient Self-Care Activities:  Attend all scheduled provider  appointments Call pharmacy for medication refills 3-7 days in advance of running out of medications Call provider office for new concerns or questions  Perform all self care activities independently  Take medications as prescribed    Plan:  Telephone follow up appointment with care management team member scheduled for:  06/19/24 at 10:00             Please call the Suicide and Crisis Lifeline: 988 call the [place]  National Suicide Prevention Lifeline: 671-267-2858 or TTY: 872-406-4310 TTY (207)418-5077) to talk to a trained counselor call 1-800-273-TALK (toll free, 24 hour hotline) if you are experiencing a Mental Health or Behavioral Health Crisis or need someone to talk to.  Patient verbalizes understanding of instructions and care plan provided today and agrees to view in MyChart. Active MyChart status and patient understanding of how to access instructions and care plan via MyChart confirmed with patient.      Clarnce Crow BSN RN CCM [place]  [facility], [facility] RN Care Manager Direct Dial: [phone] Fax: [phone]

## 2024-06-05 NOTE — Progress Notes (Signed)
 Subjective:  Patient ID: Rachel Hensley, female    DOB: 1956/02/07  Age: 68 y.o. MRN: [id number]  Chief Complaint  Patient presents with   Medical Management of Chronic Issues    HPI: Diabetes:  Complications: nephropathy.  Glucose checking: daily  Glucose logs: 126-187 Hypoglycemia: yes. In the morning they are 70-80 sometimes.  Most recent A1C: 6.0% Current medications:  Ozempic  2 mg weekly, Jardiance  25mg  daily-having watery BM   Last Eye Exam: 08/2023.  Foot checks: daily. Eating healthy.  Not exercising due to knee pain.  Hyperlipidemia: Current medications: Vascepa  1 gm 2 capsules twice a day and atorvastatin  20 mg once daily.    Hypertension: Current medications: metoprolol  50 mg daily,   Atrial fibrillation: flecainide , metoprolol  and eliquis .  GERD: omeprazole  20 mg twice daily. Well controlled.   Diet: fairly healthy Exercise: had to stop.   Patient awoke with her 4th digit of left hand and was swollen and after 4 days went to urgent care and it was fractured. She did not remember any falls or injuries.   On Friday goes for an mri of her left knee. Continue to left knee pain and buckles at times. Knee injection helped that was given by orthopedics. Patient saw [employer].      06/06/2024    8:14 AM 04/13/2024   11:18 AM 12/01/2023    8:29 AM 08/16/2023    4:19 PM 07/29/2023    8:38 AM  Depression screen PHQ 2/9  Decreased Interest 0 0 0 0 0  Down, Depressed, Hopeless 0 0 0 0 1  PHQ - 2 Score 0 0 0 0 1  Altered sleeping 1  1  3   Tired, decreased energy 0  1  3  Change in appetite 0  0  1  Feeling bad or failure about yourself  0  0  0  Trouble concentrating 0  0  0  Moving slowly or fidgety/restless 0  0  0  Suicidal thoughts 0  0  0  PHQ-9 Score 1  2  8   Difficult doing work/chores Not difficult at all  Not difficult at all  Not difficult at all        05/19/2024    9:31 AM  Fall Risk   Falls in the past year? 0    Patient Care  Team: Mercy Stall, MD as PCP - General (Family Medicine) Manfred Seed, MD as Consulting Physician (Cardiology) Clarnce Crow, RN as [facility] Care Management (General Practice) Clarnce Crow, RN   Review of Systems  Constitutional:  Negative for chills, fatigue and fever.  HENT:  Negative for congestion, ear pain, rhinorrhea and sore throat.   Respiratory:  Negative for cough and shortness of breath.   Cardiovascular:  Negative for chest pain.  Gastrointestinal:  Positive for diarrhea. Negative for abdominal pain, constipation, nausea and vomiting.  Genitourinary:  Negative for dysuria and urgency.  Musculoskeletal:  Positive for arthralgias. Negative for back pain and myalgias.  Neurological:  Negative for dizziness, weakness, light-headedness and headaches.  Psychiatric/Behavioral:  Negative for dysphoric mood. The patient is not nervous/anxious.     Current Outpatient Medications on File Prior to Visit  Medication Sig Dispense Refill   apixaban  (ELIQUIS ) 5 MG TABS tablet Take 1 tablet (5 mg total) by mouth 2 (two) times daily. 180 tablet 1   atorvastatin  (LIPITOR) 20 MG tablet Take 1 tablet by mouth once daily 90 tablet 0   cetirizine (ZYRTEC) 10 MG tablet Take 10  mg by mouth daily.     diltiazem (CARDIZEM) 30 MG tablet Take 30 mg by mouth every 6 (six) hours as needed (HR).     empagliflozin  (JARDIANCE ) 25 MG TABS tablet Take 1 tablet (25 mg total) by mouth daily. Take one tablet by mouth daily 90 tablet 3   EPINEPHrine  0.3 mg/0.3 mL IJ SOAJ injection Inject 0.3 mg into the muscle once.     flecainide  (TAMBOCOR ) 50 MG tablet Take 1 tablet (50 mg total) by mouth 2 (two) times daily. 180 tablet 1   icosapent  Ethyl (VASCEPA ) 1 g capsule Take 2 capsules (2 g total) by mouth 2 (two) times daily. 360 capsule 1   magnesium oxide (MAG-OX) 400 (240 Mg) MG tablet Take 800 mg by mouth 2 (two) times daily.     metoprolol  succinate (TOPROL -XL) 50 MG 24 hr tablet TAKE 1 TABLET BY MOUTH ONCE  DAILY.  TAKE WITH OR IMMEDIATELY FOLLOWING A MEAL (Patient taking differently: Take 50 mg by mouth daily.) 90 tablet 2   omeprazole  (PRILOSEC) 20 MG capsule Take 1 capsule by mouth twice daily 180 capsule 0   ondansetron  (ZOFRAN  ODT) 4 MG disintegrating tablet Take 1 tablet (4 mg total) by mouth every 8 (eight) hours as needed for nausea or vomiting. 30 tablet 0   promethazine  (PHENERGAN ) 25 MG tablet Take 1 tablet (25 mg total) by mouth every 8 (eight) hours as needed for nausea or vomiting. 20 tablet 0   Semaglutide , 1 MG/DOSE, 4 MG/3ML SOPN Inject 1 mg as directed once a week. (Patient taking differently: Inject 2 mg as directed once a week.) 3 mL 1   No current facility-administered medications on file prior to visit.   Past Medical History:  Diagnosis Date   A-fib [facility])    Angioedema 04/05/2018   GERD (gastroesophageal reflux disease)    History of migraine    Hyperlipidemia    Hypertension    Hypoglycemia due to type 2 diabetes mellitus (HCC) 08/21/2020   Mixed hyperlipidemia    Nontoxic multinodular goiter    Other vitamin B12 deficiency anemias    Primary insomnia 05/20/2020   Solitary pulmonary nodule    Type 2 diabetes mellitus with other specified complication [facility])    Past Surgical History:  Procedure Laterality Date   ABDOMINAL HYSTERECTOMY  06/1993   partial; still has both ovaries   HYSTEROTOMY     partial    Family History  Problem Relation Age of Onset   Coronary artery disease Mother    Glaucoma Mother    Colon cancer Father    Diabetes Sister    Atrial fibrillation Sister    Kidney cancer Brother    Diabetes type II Brother    Diabetes Brother    Prostate cancer Brother    Diabetes Brother    Coronary artery disease Brother    Asthma Son        childhood asthma   Arrhythmia Son    Atrial fibrillation Son    Parkinsonism Son    Eczema Grandchild    Atopy Grandchild    Colon cancer Paternal Uncle    Throat cancer Cousin    Lymphoma Maternal Aunt     Breast cancer Paternal Aunt    Allergic rhinitis Neg Hx    Angioedema Neg Hx    Immunodeficiency Neg Hx    Urticaria Neg Hx    Social History   Socioeconomic History   Marital status: Widowed    Spouse name: Not on file  Number of children: 3   Years of education: Not on file   Highest education level: GED or equivalent  Occupational History   Occupation: RETIRED SEWER  Tobacco Use   Smoking status: Never   Smokeless tobacco: Never  Vaping Use   Vaping status: Never Used  Substance and Sexual Activity   Alcohol use: Never   Drug use: Never   Sexual activity: Not Currently  Other Topics Concern   Not on file  Social History Narrative   Not on file   Social Drivers of Health   Financial Resource Strain: Low Risk  (07/23/2023)   Overall Financial Resource Strain (CARDIA)    Difficulty of Paying Living Expenses: Not very hard  Recent Concern: Financial Resource Strain - Medium Risk (06/02/2023)   Overall Financial Resource Strain (CARDIA)    Difficulty of Paying Living Expenses: Somewhat hard  Food Insecurity: No Food Insecurity (05/19/2024)   Hunger Vital Sign    Worried About Running Out of Food in the Last Year: Never true    Ran Out of Food in the Last Year: Never true  Transportation Needs: No Transportation Needs (05/19/2024)   PRAPARE - Administrator, Civil Service (Medical): No    Lack of Transportation (Non-Medical): No  Physical Activity: Inactive (06/06/2024)   Exercise Vital Sign    Days of Exercise per Week: 0 days    Minutes of Exercise per Session: 0 min  Stress: No Stress Concern Present (07/23/2023)   [employer] of Occupational Health - Occupational Stress Questionnaire    Feeling of Stress : Not at all  Social Connections: Moderately Isolated (06/06/2024)   Social Connection and Isolation Panel    Frequency of Communication with Friends and Family: More than three times a week    Frequency of Social Gatherings with Friends and  Family: Three times a week    Attends Religious Services: More than 4 times per year    Active Member of Clubs or Organizations: No    Attends Banker Meetings: Never    Marital Status: Widowed    Objective:  BP 126/62   Pulse 62   Temp 97.9 F (36.6 C)   Ht 5' 3 (1.6 m)   Wt 142 lb (64.4 kg)   LMP  (LMP Unknown)   SpO2 99%   BMI 25.15 kg/m      06/06/2024    8:12 AM 05/15/2024    9:21 AM 05/05/2024    3:44 PM  BP/Weight  Systolic BP 126 137   Diastolic BP 62 68   Wt. (Lbs) 142 148.3 142  BMI 25.15 kg/m2 26.27 kg/m2 25.15 kg/m2    Physical Exam Vitals reviewed.  Constitutional:      Appearance: Normal appearance. She is normal weight.  Neck:     Vascular: No carotid bruit.   Cardiovascular:     Rate and Rhythm: Normal rate and regular rhythm.     Heart sounds: Normal heart sounds.  Pulmonary:     Effort: Pulmonary effort is normal. No respiratory distress.     Breath sounds: Normal breath sounds.  Abdominal:     General: Abdomen is flat. Bowel sounds are normal.     Palpations: Abdomen is soft.     Tenderness: There is no abdominal tenderness.   Neurological:     Mental Status: She is alert and oriented to person, place, and time.   Psychiatric:        Mood and Affect: Mood normal.  Behavior: Behavior normal.     Diabetic Foot Exam - Simple   Simple Foot Form  06/06/2024 10:01 PM  Visual Inspection No deformities, no ulcerations, no other skin breakdown bilaterally: Yes Sensation Testing Intact to touch and monofilament testing bilaterally: Yes Pulse Check Posterior Tibialis and Dorsalis pulse intact bilaterally: Yes Comments      Lab Results  Component Value Date   WBC 8.8 06/06/2024   HGB 15.9 06/06/2024   HCT 46.6 06/06/2024   PLT 242 06/06/2024   GLUCOSE 177 (H) 06/06/2024   CHOL 174 06/06/2024   TRIG 214 (H) 06/06/2024   HDL 47 06/06/2024   LDLCALC 91 06/06/2024   ALT 33 (H) 06/06/2024   AST 21 06/06/2024   NA 142  06/06/2024   K 5.0 06/06/2024   CL 102 06/06/2024   CREATININE 0.82 06/06/2024   BUN 23 06/06/2024   CO2 22 06/06/2024   TSH 0.777 01/19/2023   HGBA1C 7.5 (H) 06/06/2024   MICROALBUR 150 10/27/2021      Assessment & Plan:  Diabetic glomerulopathy (HCC) Assessment & Plan: Not at goal.  Recommend continue to work on eating healthy diet and exercise. Medications: Continue jardiance  25 mg daily.  Hold ozempic  to see if this is the cause of diarrhea.  Start on glimeperide 4 mg twice daily on Sunday when next shot of ozempic  is due.   Orders: -     Hemoglobin A1c -     Microalbumin / creatinine urine ratio  Paroxysmal atrial fibrillation (HCC) Assessment & Plan: At goal Continue current medications. Continue flecainide , metoprolol  and eliquis .    GERD without esophagitis Assessment & Plan: The current medical regimen is effective;  continue present plan and medications.  Continue omeprazole  20 mg twice daily. Add otc famotidine 20 mg daily.     Mixed hyperlipidemia Assessment & Plan: Not at goal Continue atorvastatin  20 mg daily and vascepa  1 gm 2 capsules twice daily.  Continue to work on eating a healthy diet and exercise.  Labs drawn today.    Orders: -     Lipid panel  Essential hypertension, benign Assessment & Plan: Well controlled.  No changes to medicines. Continue metoprolol  50 mg daily. Continue Aspirin 81 mg daily. Continue to work on eating a healthy diet and exercise.  Labs drawn today.    Orders: -     CBC with Differential/Platelet -     Comprehensive metabolic panel with GFR  Encounter for screening mammogram for malignant neoplasm of breast Assessment & Plan: Check mammogram.   Orders: -     Digital Screening Mammogram, Left and Right; Future  Diarrhea, unspecified type Assessment & Plan: Hold ozempic  to see if this is the cause of diarrhea.       No orders of the defined types were placed in this encounter.   Orders Placed  This Encounter  Procedures   MM DIGITAL SCREENING BILATERAL   CBC with Differential/Platelet   Comprehensive metabolic panel with GFR   Hemoglobin A1c   Lipid panel   Microalbumin / creatinine urine ratio     Follow-up: Return in about 3 months (around 09/06/2024).   I,Marla I Leal-Borjas,acting as a scribe for Mercy Stall, MD.,have documented all relevant documentation on the behalf of Mercy Stall, MD,as directed by  Mercy Stall, MD while in the presence of Mercy Stall, MD.   An After Visit Summary was printed and given to the patient.  I attest that I have reviewed this visit and agree with the  plan scribed by my staff.   Mercy Stall, MD [facility] [phone]

## 2024-06-06 ENCOUNTER — Ambulatory Visit: Admitting: Family Medicine

## 2024-06-06 VITALS — BP 126/62 | HR 62 | Temp 97.9°F | Ht 63.0 in | Wt 142.0 lb

## 2024-06-06 DIAGNOSIS — K219 Gastro-esophageal reflux disease without esophagitis: Secondary | ICD-10-CM | POA: Diagnosis not present

## 2024-06-06 DIAGNOSIS — E1121 Type 2 diabetes mellitus with diabetic nephropathy: Secondary | ICD-10-CM

## 2024-06-06 DIAGNOSIS — Z1231 Encounter for screening mammogram for malignant neoplasm of breast: Secondary | ICD-10-CM

## 2024-06-06 DIAGNOSIS — R197 Diarrhea, unspecified: Secondary | ICD-10-CM

## 2024-06-06 DIAGNOSIS — I10 Essential (primary) hypertension: Secondary | ICD-10-CM

## 2024-06-06 DIAGNOSIS — I480 Paroxysmal atrial fibrillation: Secondary | ICD-10-CM | POA: Diagnosis not present

## 2024-06-06 DIAGNOSIS — E782 Mixed hyperlipidemia: Secondary | ICD-10-CM | POA: Diagnosis not present

## 2024-06-06 LAB — CBC WITH DIFFERENTIAL/PLATELET
Basophils Absolute: 0 10*3/uL (ref 0.0–0.2)
Basos: 1 %
EOS (ABSOLUTE): 0.2 10*3/uL (ref 0.0–0.4)
Eos: 3 %
Hematocrit: 46.6 % (ref 34.0–46.6)
Hemoglobin: 15.9 g/dL (ref 11.1–15.9)
Immature Grans (Abs): 0 10*3/uL (ref 0.0–0.1)
Immature Granulocytes: 0 %
Lymphocytes Absolute: 3.3 10*3/uL — ABNORMAL HIGH (ref 0.7–3.1)
Lymphs: 38 %
MCH: 32.1 pg (ref 26.6–33.0)
MCHC: 34.1 g/dL (ref 31.5–35.7)
MCV: 94 fL (ref 79–97)
Monocytes Absolute: 0.6 10*3/uL (ref 0.1–0.9)
Monocytes: 7 %
Neutrophils Absolute: 4.5 10*3/uL (ref 1.4–7.0)
Neutrophils: 50 %
Platelets: 242 10*3/uL (ref 150–450)
RBC: 4.95 x10E6/uL (ref 3.77–5.28)
RDW: 12.9 % (ref 11.7–15.4)
WBC: 8.8 10*3/uL (ref 3.4–10.8)

## 2024-06-06 LAB — COMPREHENSIVE METABOLIC PANEL WITH GFR
ALT: 33 IU/L — ABNORMAL HIGH (ref 0–32)
AST: 21 IU/L (ref 0–40)
Albumin: 4.6 g/dL (ref 3.9–4.9)
Alkaline Phosphatase: 120 IU/L (ref 44–121)
BUN/Creatinine Ratio: 28 (ref 12–28)
BUN: 23 mg/dL (ref 8–27)
Bilirubin Total: 0.6 mg/dL (ref 0.0–1.2)
CO2: 22 mmol/L (ref 20–29)
Calcium: 10.2 mg/dL (ref 8.7–10.3)
Chloride: 102 mmol/L (ref 96–106)
Creatinine, Ser: 0.82 mg/dL (ref 0.57–1.00)
Globulin, Total: 2.9 g/dL (ref 1.5–4.5)
Glucose: 177 mg/dL — ABNORMAL HIGH (ref 70–99)
Potassium: 5 mmol/L (ref 3.5–5.2)
Sodium: 142 mmol/L (ref 134–144)
Total Protein: 7.5 g/dL (ref 6.0–8.5)
eGFR: 77 mL/min/{1.73_m2} (ref >59)

## 2024-06-06 LAB — HEMOGLOBIN A1C
Est. average glucose Bld gHb Est-mCnc: 169 mg/dL
Hgb A1c MFr Bld: 7.5 % — ABNORMAL HIGH (ref 4.8–5.6)

## 2024-06-06 LAB — LIPID PANEL
Chol/HDL Ratio: 3.7 ratio (ref 0.0–4.4)
Cholesterol, Total: 174 mg/dL (ref 100–199)
HDL: 47 mg/dL (ref >39)
LDL Chol Calc (NIH): 91 mg/dL (ref 0–99)
Triglycerides: 214 mg/dL — ABNORMAL HIGH (ref 0–149)
VLDL Cholesterol Cal: 36 mg/dL (ref 5–40)

## 2024-06-06 NOTE — Patient Instructions (Addendum)
 For diabetes: Continue jardiance  25 mg daily.  Hold ozempic   Start on glimeperide 4 mg twice daily on Sunday.   For reflux: recommend otc famotidine 20 mg daily.

## 2024-06-07 ENCOUNTER — Ambulatory Visit: Payer: Self-pay | Admitting: Family Medicine

## 2024-06-08 LAB — MICROALBUMIN / CREATININE URINE RATIO
Creatinine, Urine: 74 mg/dL
Microalb/Creat Ratio: 108 mg/g{creat} — ABNORMAL HIGH (ref 0–29)
Microalbumin, Urine: 80.2 ug/mL

## 2024-06-10 ENCOUNTER — Encounter: Payer: Self-pay | Admitting: Family Medicine

## 2024-06-10 DIAGNOSIS — Z1231 Encounter for screening mammogram for malignant neoplasm of breast: Secondary | ICD-10-CM | POA: Insufficient documentation

## 2024-06-10 HISTORY — DX: Encounter for screening mammogram for malignant neoplasm of breast: Z12.31

## 2024-06-10 NOTE — Assessment & Plan Note (Signed)
Not at goal Continue atorvastatin 20 mg daily and vascepa 1 gm 2 capsules twice daily.  Continue to work on eating a healthy diet and exercise.  Labs drawn today.

## 2024-06-10 NOTE — Assessment & Plan Note (Signed)
 Hold ozempic  to see if this is the cause of diarrhea.

## 2024-06-10 NOTE — Assessment & Plan Note (Signed)
 The current medical regimen is effective;  continue present plan and medications.  Continue omeprazole  20 mg twice daily. Add otc famotidine 20 mg daily.

## 2024-06-10 NOTE — Assessment & Plan Note (Signed)
 Not at goal.  Recommend continue to work on eating healthy diet and exercise. Medications: Continue jardiance  25 mg daily.  Hold ozempic  to see if this is the cause of diarrhea.  Start on glimeperide 4 mg twice daily on Sunday when next shot of ozempic  is due.

## 2024-06-10 NOTE — Assessment & Plan Note (Signed)
Check mammogram 

## 2024-06-10 NOTE — Assessment & Plan Note (Signed)
 Well controlled.  No changes to medicines. Continue metoprolol 50 mg daily. Continue Aspirin 81 mg daily. Continue to work on eating a healthy diet and exercise.  Labs drawn today.

## 2024-06-10 NOTE — Assessment & Plan Note (Signed)
 At goal Continue current medications. Continue flecainide , metoprolol  and eliquis .

## 2024-06-13 ENCOUNTER — Telehealth: Payer: Self-pay

## 2024-06-13 NOTE — Telephone Encounter (Signed)
 Patient assistance received for Ozempic  1 mg and called and informed patient. Patient was recently increased to the Ozempic  2 mg to see how her symptoms and sugars run. Patient is coming in for a 3-4 week follow up and she will get the Patient assistance at that time.   Received: Ozempic  1 mg 4 boxes  NDC# [address]-40 Lot# [id number] Exp: 10/27/2026

## 2024-06-14 ENCOUNTER — Encounter

## 2024-06-19 ENCOUNTER — Other Ambulatory Visit: Payer: Self-pay

## 2024-06-19 DIAGNOSIS — E1121 Type 2 diabetes mellitus with diabetic nephropathy: Secondary | ICD-10-CM

## 2024-06-19 NOTE — Patient Outreach (Signed)
 Complex Care Management   Visit Note  06/19/2024  Name:  Rachel Hensley MRN: [id number] DOB: 01/26/1956  Situation: Referral received for Complex Care Management related to Diabetes with Complications I obtained verbal consent from Patient.  Visit completed with patient  on the phone  Background:   Past Medical History:  Diagnosis Date   A-fib [facility])    Angioedema 04/05/2018   GERD (gastroesophageal reflux disease)    History of migraine    Hyperlipidemia    Hypertension    Hypoglycemia due to type 2 diabetes mellitus (HCC) 08/21/2020   Mixed hyperlipidemia    Nontoxic multinodular goiter    Other vitamin B12 deficiency anemias    Primary insomnia 05/20/2020   Solitary pulmonary nodule    Type 2 diabetes mellitus with other specified complication (HCC)     Assessment: Patient Reported Symptoms:  Cognitive Cognitive Status: Alert and oriented to person, place, and time      Neurological Neurological Review of Symptoms: No symptoms reported    HEENT HEENT Symptoms Reported: No symptoms reported      Cardiovascular Cardiovascular Symptoms Reported: No symptoms reported Does patient have uncontrolled Hypertension?: No Is patient checking Blood Pressure at home?: Yes Patient's Recent BP reading at home: provider reports BP well controlled Cardiovascular Conditions: Dysrhythmia, Hypertension Cardiovascular Management Strategies: Medication therapy, Routine screening  Respiratory Respiratory Symptoms Reported: No symptoms reported    Endocrine Patient reports the following symptoms related to hypoglycemia or hyperglycemia : No symptoms reported Is patient diabetic?: Yes Is patient checking blood sugars at home?: Yes Endocrine Conditions: Diabetes Endocrine Management Strategies: Medication therapy, Weight management, Routine screening, Diet modification  Gastrointestinal Gastrointestinal Symptoms Reported: Diarrhea Additional Gastrointestinal Details: daily diarrhea  attributed to ozempic , last dose was 6/8 Gastrointestinal Conditions: Diarrhea Gastrointestinal Management Strategies: Medication therapy    Genitourinary Genitourinary Symptoms Reported: No symptoms reported    Integumentary Integumentary Symptoms Reported: No symptoms reported    Musculoskeletal Musculoskelatal Symptoms Reviewed: Difficulty walking Other Musculoskeletal Symptoms: patient reports seeing ortho due to right knee arthritis, planning to have injection Musculoskeletal Conditions: Joint pain Musculoskeletal Management Strategies: Medication therapy, Routine screening, Activity Falls in the past year?: No    Psychosocial Psychosocial Symptoms Reported: No symptoms reported            06/06/2024    8:14 AM  Depression screen PHQ 2/9  Decreased Interest 0  Down, Depressed, Hopeless 0  PHQ - 2 Score 0  Altered sleeping 1  Tired, decreased energy 0  Change in appetite 0  Feeling bad or failure about yourself  0  Trouble concentrating 0  Moving slowly or fidgety/restless 0  Suicidal thoughts 0  PHQ-9 Score 1  Difficult doing work/chores Not difficult at all    There were no vitals filed for this visit.  Medications Reviewed Today     Reviewed by Lonzell Planas, RN (Registered Nurse) on 06/19/24 at 1028  Med List Status: <None>   Medication Order Taking? Sig Documenting Provider Last Dose Status Informant  apixaban  (ELIQUIS ) 5 MG TABS tablet [id number] Yes Take 1 tablet (5 mg total) by mouth 2 (two) times daily. Cox, Kirsten, MD  Active   atorvastatin  (LIPITOR) 20 MG tablet [id number] Yes Take 1 tablet by mouth once daily Cox, Kirsten, MD  Active   cetirizine (ZYRTEC) 10 MG tablet [id number] Yes Take 10 mg by mouth daily. [provider]  Active   diltiazem (CARDIZEM) 30 MG tablet [id number] Yes Take 30 mg by mouth every 6 (six) hours  as needed (HR). [provider]  Active   empagliflozin  (JARDIANCE ) 25 MG TABS tablet [id number] Yes Take 1 tablet (25  mg total) by mouth daily. Take one tablet by mouth daily Cox, Kirsten, MD  Active   EPINEPHrine  0.3 mg/0.3 mL IJ SOAJ injection [id number] Yes Inject 0.3 mg into the muscle once. [provider]  Active   flecainide  (TAMBOCOR ) 50 MG tablet [id number] Yes Take 1 tablet (50 mg total) by mouth 2 (two) times daily. Krasowski, Robert J, MD  Active   icosapent  Ethyl (VASCEPA ) 1 g capsule [id number] Yes Take 2 capsules (2 g total) by mouth 2 (two) times daily. Cox, Kirsten, MD  Active   magnesium oxide (MAG-OX) 400 (240 Mg) MG tablet [id number] Yes Take 800 mg by mouth 2 (two) times daily. [provider]  Active   metoprolol  succinate (TOPROL -XL) 50 MG 24 hr tablet [id number] Yes TAKE 1 TABLET BY MOUTH ONCE DAILY.  TAKE WITH OR IMMEDIATELY FOLLOWING A MEAL Bernie Lamar PARAS, MD  Active   omeprazole  (PRILOSEC) 20 MG capsule [id number] Yes Take 1 capsule by mouth twice daily  Patient taking differently: Take 20 mg by mouth as needed.   Cox, Kirsten, MD  Active            Med Note Rachel Hensley, Zakaria Fromer   Fri May 19, 2024  9:19 AM) Taking 1 per day for next 3 days, then 1 every other day for one week, then stop as long as symptoms dont return.  ondansetron  (ZOFRAN  ODT) 4 MG disintegrating tablet [id number]  Take 1 tablet (4 mg total) by mouth every 8 (eight) hours as needed for nausea or vomiting. Charlene Clotilda PARAS, NP  Active            Med Note Rachel Hensley, Kalep Full   Mon Apr 03, 2024  2:56 PM) Has on hand, not needed  promethazine  (PHENERGAN ) 25 MG tablet [id number] Yes Take 1 tablet (25 mg total) by mouth every 8 (eight) hours as needed for nausea or vomiting. Charlene Clotilda PARAS, NP  Active            Med Note Rachel Hensley, Navreet Bolda   Mon Apr 03, 2024  2:57 PM) Has on hand, has not needed  Semaglutide , 1 MG/DOSE, 4 MG/3ML SOPN [id number]  Inject 1 mg as directed once a week.  Patient not taking: Reported on 06/19/2024   Sherre Clapper, MD  Active             Recommendation:   PCP Follow-up Referral to: VBCI  Clinical Pharmacist  Follow Up Plan:   Telephone follow up appointment date/time:  07/19/24   Olam Idol BSN RN CCM [place]  [facility], [facility] RN Care Manager Direct Dial: [phone] Fax: [phone]

## 2024-06-19 NOTE — Patient Instructions (Signed)
 Visit Information  Thank you for taking time to visit with me today. Please don't hesitate to contact me if I can be of assistance to you before our next scheduled appointment.  Our next appointment is by telephone on 07/19/24 at 10:00 Please call the care guide team at [phone] if you need to cancel or reschedule your appointment.   Following is a copy of your care plan:   Goals Addressed             This Visit's Progress    VBCI RN Care Plan   On track    Problems:  Chronic Disease Management support and education needs related to DMII  Goal: Over the next 3 months the Patient will attend all scheduled medical appointments: AWV 07/06/24 06/06/24 PCP as evidenced by chart review and patient repoirt        continue to work with RN Care Manager and/or Social Worker to address care management and care coordination needs related to DMII as evidenced by adherence to CM Team Scheduled appointments     demonstrate a decrease DMII in exacerbations as evidenced by controlled blood sugars within normal ranges  Interventions:   Diabetes Interventions:  (Status:  Goal on track:  Yes.) Long Term Goal Assessed patient's understanding of A1c goal: <6.5% Provided education to patient about basic DM disease process Reviewed medications with patient and discussed importance of medication adherence Counseled on importance of regular laboratory monitoring as prescribed Discussed plans with patient for ongoing care management follow up and provided patient with direct contact information for care management team Reviewed scheduled/upcoming provider appointments including:  AWV 07/06/24 PCP 06/06/24 Advised patient, providing education and rationale, to check cbg daily prior to breakfast and record, calling PCP for findings outside established parameters Lab Results  Component Value Date   HGBA1C 7.5 (H) 06/06/2024   A1C up to 7.5 from 6.0 three months ago.  Patient reports fasting BG has improved,  (126 on 06/19/24) Patient was ordered to pause taking Ozempic  due to diarrhea, last dose was 6/8, diarrhea is daily, has not improved.  Patient also notes is still waiting to hear about PAP for Jardiance , she only has two weeks left.  Referral to Clinical Pharmacist completed. Patient Self-Care Activities:  Attend all scheduled provider appointments Call pharmacy for medication refills 3-7 days in advance of running out of medications Call provider office for new concerns or questions  Perform all self care activities independently  Take medications as prescribed    Plan:  Telephone follow up appointment with care management team member scheduled for:  07/19/24 at 10:00             Please call the Suicide and Crisis Lifeline: 988 call the [place]  National Suicide Prevention Lifeline: 619-637-6419 or TTY: 6160537279 TTY 2790910100) to talk to a trained counselor call 1-800-273-TALK (toll free, 24 hour hotline) if you are experiencing a Mental Health or Behavioral Health Crisis or need someone to talk to.  Patient verbalizes understanding of instructions and care plan provided today and agrees to view in MyChart. Active MyChart status and patient understanding of how to access instructions and care plan via MyChart confirmed with patient.      Olam Idol BSN RN CCM [place]  [facility], [facility] RN Care Manager Direct Dial: [phone] Fax: [phone]

## 2024-07-06 ENCOUNTER — Ambulatory Visit

## 2024-07-06 VITALS — Ht 63.0 in | Wt 142.0 lb

## 2024-07-06 DIAGNOSIS — Z0000 Encounter for general adult medical examination without abnormal findings: Secondary | ICD-10-CM

## 2024-07-06 NOTE — Patient Instructions (Signed)
 [name] , Thank you for taking time out of your busy schedule to complete your Annual Wellness Visit with me. I enjoyed our conversation and look forward to speaking with you again next year. I, as well as your care team,  appreciate your ongoing commitment to your health goals. Please review the following plan we discussed and let me know if I can assist you in the future. Your Game plan/ To Do List    Follow up Visits: Next Medicare AWV with our clinical staff: In 1 year    Have you seen your provider in the last 6 months (3 months if uncontrolled diabetes)? Yes Next Office Visit with your provider: 09/15/24 @ 9:20  Clinician Recommendations:  Aim for 30 minutes of exercise or brisk walking, 6-8 glasses of water, and 5 servings of fruits and vegetables each day.       This is a list of the screening recommended for you and due dates:  Health Maintenance  Topic Date Due   COVID-19 Vaccine (1) Never done   Zoster (Shingles) Vaccine (1 of 2) Never done   Mammogram  05/04/2024   DTaP/Tdap/Td vaccine (1 - Tdap) 04/13/2025*   Flu Shot  07/28/2024   Hemoglobin A1C  12/06/2024   Complete foot exam   03/02/2025   Eye exam for diabetics  04/06/2025   Yearly kidney function blood test for diabetes  06/06/2025   Yearly kidney health urinalysis for diabetes  06/06/2025   Medicare Annual Wellness Visit  07/06/2025   Colon Cancer Screening  03/31/2026   Pneumococcal Vaccine for age over 48  Completed   DEXA scan (bone density measurement)  Completed   Hepatitis C Screening  Completed   Hepatitis B Vaccine  Aged Out   HPV Vaccine  Aged Out   Meningitis B Vaccine  Aged Out  *Topic was postponed. The date shown is not the original due date.    Advanced directives: (ACP Link)Information on Advanced Care Planning can be found at [place]  Secretary of [facility] Advance Health Care Directives Advance Health Care Directives. [email or web address]   Advance Care Planning is important because it:  [x]   Makes sure you receive the medical care that is consistent with your values, goals, and preferences  [x]  It provides guidance to your family and loved ones and reduces their decisional burden about whether or not they are making the right decisions based on your wishes.  Follow the link provided in your after visit summary or read over the paperwork we have mailed to you to help you started getting your Advance Directives in place. If you need assistance in completing these, please reach out to us  so that we can help you!  See attachments for Preventive Care and Fall Prevention Tips.

## 2024-07-06 NOTE — Progress Notes (Signed)
 Subjective:   Rachel Hensley is a 68 y.o. who presents for a Medicare Wellness preventive visit.  As a reminder, Annual Wellness Visits don't include a physical exam, and some assessments may be limited, especially if this visit is performed virtually. We may recommend an in-person follow-up visit with your provider if needed.  Visit Complete: Virtual I connected with  FIZZA SCALES on 07/06/24 by a audio enabled telemedicine application and verified that I am speaking with the correct person using two identifiers.  Patient Location: Home  Provider Location: Home Office  I discussed the limitations of evaluation and management by telemedicine. The patient expressed understanding and agreed to proceed.  Vital Signs: Because this visit was a virtual/telehealth visit, some criteria may be missing or patient reported. Any vitals not documented were not able to be obtained and vitals that have been documented are patient reported.  VideoDeclined- This patient declined Librarian, academic. Therefore the visit was completed with audio only.  Persons Participating in Visit: Patient.  AWV Questionnaire: No: Patient Medicare AWV questionnaire was not completed prior to this visit.  Cardiac Risk Factors include: advanced age (>53men, >1 women);diabetes mellitus;hypertension;dyslipidemia     Objective:    Today's Vitals   07/06/24 1517  Weight: 142 lb (64.4 kg)  Height: 5' 3 (1.6 m)   Body mass index is 25.15 kg/m.     07/06/2024    3:24 PM 05/15/2024    9:21 AM 11/16/2023   10:19 AM 08/24/2023   12:50 PM 08/19/2023   12:37 PM 08/16/2023    4:07 PM 05/29/2022    1:16 PM  Advanced Directives  Does Patient Have a Medical Advance Directive? No No No No No No No  Would patient like information on creating a medical advance directive? Yes (MAU/Ambulatory/Procedural Areas - Information given)   No - Patient declined No - Patient declined  No - Patient  declined    Current Medications (verified) Outpatient Encounter Medications as of 07/06/2024  Medication Sig   apixaban  (ELIQUIS ) 5 MG TABS tablet Take 1 tablet (5 mg total) by mouth 2 (two) times daily.   atorvastatin  (LIPITOR) 20 MG tablet Take 1 tablet by mouth once daily   cetirizine (ZYRTEC) 10 MG tablet Take 10 mg by mouth daily.   diltiazem (CARDIZEM) 30 MG tablet Take 30 mg by mouth every 6 (six) hours as needed (HR).   empagliflozin  (JARDIANCE ) 25 MG TABS tablet Take 1 tablet (25 mg total) by mouth daily. Take one tablet by mouth daily   EPINEPHrine  0.3 mg/0.3 mL IJ SOAJ injection Inject 0.3 mg into the muscle once.   flecainide  (TAMBOCOR ) 50 MG tablet Take 1 tablet (50 mg total) by mouth 2 (two) times daily.   icosapent  Ethyl (VASCEPA ) 1 g capsule Take 2 capsules (2 g total) by mouth 2 (two) times daily.   magnesium oxide (MAG-OX) 400 (240 Mg) MG tablet Take 800 mg by mouth 2 (two) times daily.   metoprolol  succinate (TOPROL -XL) 50 MG 24 hr tablet TAKE 1 TABLET BY MOUTH ONCE DAILY.  TAKE WITH OR IMMEDIATELY FOLLOWING A MEAL   omeprazole  (PRILOSEC) 20 MG capsule Take 1 capsule by mouth twice daily (Patient taking differently: Take 20 mg by mouth as needed.)   ondansetron  (ZOFRAN  ODT) 4 MG disintegrating tablet Take 1 tablet (4 mg total) by mouth every 8 (eight) hours as needed for nausea or vomiting.   promethazine  (PHENERGAN ) 25 MG tablet Take 1 tablet (25 mg total) by mouth  every 8 (eight) hours as needed for nausea or vomiting.   Semaglutide , 1 MG/DOSE, 4 MG/3ML SOPN Inject 1 mg as directed once a week. (Patient not taking: Reported on 07/06/2024)   No facility-administered encounter medications on file as of 07/06/2024.    Allergies (verified) Actos [pioglitazone], Fenofibrate , Lisinopril, and Prednisone   History: Past Medical History:  Diagnosis Date   A-fib (HCC)    Angioedema 04/05/2018   GERD (gastroesophageal reflux disease)    History of migraine    Hyperlipidemia     Hypertension    Hypoglycemia due to type 2 diabetes mellitus (HCC) 08/21/2020   Mixed hyperlipidemia    Nontoxic multinodular goiter    Other vitamin B12 deficiency anemias    Primary insomnia 05/20/2020   Solitary pulmonary nodule    Type 2 diabetes mellitus with other specified complication [facility])    Past Surgical History:  Procedure Laterality Date   ABDOMINAL HYSTERECTOMY  06/1993   partial; still has both ovaries   HYSTEROTOMY     partial   Family History  Problem Relation Age of Onset   Coronary artery disease Mother    Glaucoma Mother    Colon cancer Father    Diabetes Sister    Atrial fibrillation Sister    Kidney cancer Brother    Diabetes type II Brother    Diabetes Brother    Prostate cancer Brother    Diabetes Brother    Coronary artery disease Brother    Asthma Son        childhood asthma   Arrhythmia Son    Atrial fibrillation Son    Parkinsonism Son    Eczema Grandchild    Atopy Grandchild    Colon cancer Paternal Uncle    Throat cancer Cousin    Lymphoma Maternal Aunt    Breast cancer Paternal Aunt    Allergic rhinitis Neg Hx    Angioedema Neg Hx    Immunodeficiency Neg Hx    Urticaria Neg Hx    Social History   Socioeconomic History   Marital status: Widowed    Spouse name: Not on file   Number of children: 3   Years of education: Not on file   Highest education level: GED or equivalent  Occupational History   Occupation: RETIRED SEWER  Tobacco Use   Smoking status: Never   Smokeless tobacco: Never  Vaping Use   Vaping status: Never Used  Substance and Sexual Activity   Alcohol use: Never   Drug use: Never   Sexual activity: Not Currently  Other Topics Concern   Not on file  Social History Narrative   Not on file   Social Drivers of Health   Financial Resource Strain: Low Risk  (07/06/2024)   Overall Financial Resource Strain (CARDIA)    Difficulty of Paying Living Expenses: Not hard at all  Food Insecurity: No Food  Insecurity (07/06/2024)   Hunger Vital Sign    Worried About Running Out of Food in the Last Year: Never true    Ran Out of Food in the Last Year: Never true  Transportation Needs: No Transportation Needs (07/06/2024)   PRAPARE - Administrator, Civil Service (Medical): No    Lack of Transportation (Non-Medical): No  Physical Activity: Inactive (07/06/2024)   Exercise Vital Sign    Days of Exercise per Week: 0 days    Minutes of Exercise per Session: 0 min  Stress: No Stress Concern Present (07/06/2024)   [employer] of Occupational  Health - Occupational Stress Questionnaire    Feeling of Stress: Not at all  Social Connections: Moderately Isolated (07/06/2024)   Social Connection and Isolation Panel    Frequency of Communication with Friends and Family: More than three times a week    Frequency of Social Gatherings with Friends and Family: Three times a week    Attends Religious Services: More than 4 times per year    Active Member of Clubs or Organizations: No    Attends Banker Meetings: Never    Marital Status: Widowed    Tobacco Counseling Counseling given: Not Answered    Clinical Intake:  Pre-visit preparation completed: Yes  Pain : No/denies pain     Diabetes: Yes CBG done?: No Did pt. bring in CBG monitor from home?: No  Lab Results  Component Value Date   HGBA1C 7.5 (H) 06/06/2024   HGBA1C 6.0 (H) 03/02/2024   HGBA1C 6.1 (H) 12/01/2023     How often do you need to have someone help you when you read instructions, pamphlets, or other written materials from your doctor or pharmacy?: 1 - Never  Interpreter Needed?: No  Information entered by :: Charmaine Bloodgood LPN   Activities of Daily Living     07/06/2024    3:24 PM  In your present state of health, do you have any difficulty performing the following activities:  Hearing? 0  Vision? 0  Difficulty concentrating or making decisions? 0  Walking or climbing stairs? 0   Dressing or bathing? 0  Doing errands, shopping? 0  Preparing Food and eating ? N  Using the Toilet? N  In the past six months, have you accidently leaked urine? N  Do you have problems with loss of bowel control? N  Managing your Medications? N  Managing your Finances? N  Housekeeping or managing your Housekeeping? N    Patient Care Team: Sherre Clapper, MD as PCP - General (Family Medicine) Bernie Lamar PARAS, MD as Consulting Physician (Cardiology) Lonzell Planas, RN as VBCI Care Management (General Practice) Lonzell Planas, RN  I have updated your Care Teams any recent Medical Services you may have received from other providers in the past year.     Assessment:   This is a routine wellness examination for Rachel Hensley.  Hearing/Vision screen Hearing Screening - Comments:: Denies hearing difficulties   Vision Screening - Comments:: Wears rx glasses - up to date with routine [facility] exams with [facility]    Goals Addressed             This Visit's Progress    Remain active and independent   On track      Depression Screen     07/06/2024    3:22 PM 06/06/2024    8:14 AM 04/13/2024   11:18 AM 04/03/2024    3:14 PM 12/01/2023    8:29 AM 08/16/2023    4:19 PM 07/29/2023    8:38 AM  PHQ 2/9 Scores  PHQ - 2 Score 0 0 0  0 0 1  PHQ- 9 Score 1 1   2  8   Exception Documentation    Patient refusal       Fall Risk     07/06/2024    3:24 PM 06/19/2024   10:18 AM 05/19/2024    9:31 AM 05/05/2024    4:00 PM 04/13/2024   11:17 AM  Fall Risk   Falls in the past year? 0 0 0 0 0  Number falls in past  yr: 0   0 0  Injury with Fall? 0    0  Risk for fall due to : No Fall Risks    No Fall Risks  Follow up Falls prevention discussed;Education provided;Falls evaluation completed        MEDICARE RISK AT HOME:  Medicare Risk at Home Any stairs in or around the home?: No If so, are there any without handrails?: No Home free of loose throw rugs in walkways, pet beds, electrical  cords, etc?: Yes Adequate lighting in your home to reduce risk of falls?: Yes Life alert?: No Use of a cane, walker or w/c?: No Grab bars in the bathroom?: Yes Shower chair or bench in shower?: No Elevated toilet seat or a handicapped toilet?: No  TIMED UP AND GO:  Was the test performed?  No  Cognitive Function: Declined/Normal: No cognitive concerns noted by patient or family. Patient alert, oriented, able to answer questions appropriately and recall recent events. No signs of memory loss or confusion.        06/02/2023    3:01 PM 05/29/2022    1:17 PM  6CIT Screen  What Year? 0 points 0 points  What month? 0 points 0 points  What time? 0 points 0 points  Count back from 20 0 points 0 points  Months in reverse 0 points 0 points  Repeat phrase 0 points 2 points  Total Score 0 points 2 points    Immunizations Immunization History  Administered Date(s) Administered   Fluad Quad(high Dose 65+) 11/26/2020, 10/15/2021, 10/05/2022   Fluad Trivalent(High Dose 65+) 11/18/2023   Influenza-Unspecified 09/28/2019   PNEUMOCOCCAL CONJUGATE-20 10/15/2021   Pneumococcal Polysaccharide-23 02/19/2020    Screening Tests Health Maintenance  Topic Date Due   COVID-19 Vaccine (1) Never done   Zoster Vaccines- Shingrix (1 of 2) Never done   MAMMOGRAM  05/04/2024   DTaP/Tdap/Td (1 - Tdap) 04/13/2025 (Originally 08/13/1975)   INFLUENZA VACCINE  07/28/2024   HEMOGLOBIN A1C  12/06/2024   FOOT EXAM  03/02/2025   OPHTHALMOLOGY EXAM  04/06/2025   Diabetic kidney evaluation - eGFR measurement  06/06/2025   Diabetic kidney evaluation - Urine ACR  06/06/2025   Medicare Annual Wellness (AWV)  07/06/2025   Colonoscopy  03/31/2026   Pneumococcal Vaccine: 50+ Years  Completed   DEXA SCAN  Completed   Hepatitis C Screening  Completed   Hepatitis B Vaccines  Aged Out   HPV VACCINES  Aged Out   Meningococcal B Vaccine  Aged Out    Health Maintenance  Health Maintenance Due  Topic Date Due    COVID-19 Vaccine (1) Never done   Zoster Vaccines- Shingrix (1 of 2) Never done   MAMMOGRAM  05/04/2024   Health Maintenance Items Addressed: Mammogram scheduled; information provided on recommended vaccines   Additional Screening:  Vision Screening: Recommended annual ophthalmology exams for early detection of glaucoma and other disorders of the eye. Would you like a referral to an eye doctor? No    Dental Screening: Recommended annual dental exams for proper oral hygiene  Community Resource Referral / Chronic Care Management: CRR required this visit?  No   CCM required this visit?  No   Plan:    I have personally reviewed and noted the following in the patient's chart:   Medical and social history Use of alcohol, tobacco or illicit drugs  Current medications and supplements including opioid prescriptions. Patient is not currently taking opioid prescriptions. Functional ability and status Nutritional status Physical activity  Advanced directives List of other physicians Hospitalizations, surgeries, and ER visits in previous 12 months Vitals Screenings to include cognitive, depression, and falls Referrals and appointments  In addition, I have reviewed and discussed with patient certain preventive protocols, quality metrics, and best practice recommendations. A written personalized care plan for preventive services as well as general preventive health recommendations were provided to patient.   Lavelle Pfeiffer [place], [place]   2/89/7974   After Visit Summary: (MyChart) Due to this being a telephonic visit, the after visit summary with patients personalized plan was offered to patient via MyChart   Notes: Nothing significant to report at this time.

## 2024-07-10 ENCOUNTER — Ambulatory Visit: Admitting: Family Medicine

## 2024-07-12 ENCOUNTER — Other Ambulatory Visit: Payer: Self-pay | Admitting: Cardiology

## 2024-07-12 ENCOUNTER — Other Ambulatory Visit: Payer: Self-pay | Admitting: Family Medicine

## 2024-07-19 ENCOUNTER — Other Ambulatory Visit: Payer: Self-pay | Admitting: Family Medicine

## 2024-07-19 ENCOUNTER — Other Ambulatory Visit: Payer: Self-pay

## 2024-07-19 MED ORDER — SEMAGLUTIDE (2 MG/DOSE) 8 MG/3ML ~~LOC~~ SOPN: 2.0000 mg | PEN_INJECTOR | SUBCUTANEOUS | 0 refills | Status: DC

## 2024-07-19 NOTE — Patient Instructions (Signed)
 Visit Information  Thank you for taking time to visit with me today. Please don't hesitate to contact me if I can be of assistance to you before our next scheduled appointment.  Our next appointment is no further scheduled appointments.  on  at  Please call the care guide team at [phone] if you need to cancel or reschedule your appointment.   Following is a copy of your care plan:   Goals Addressed             This Visit's Progress    COMPLETED: VBCI RN Care Plan   On track    Problems:  Chronic Disease Management support and education needs related to DMII  Goal: Over the next 3 months the Patient will continue to work with Medical illustrator and/or Social Worker to address care management and care coordination needs related to DMII, HLD, and HTN as evidenced by adherence to care management team scheduled appointments     demonstrate a decrease DMII, HLD, and HTN in exacerbations as evidenced by controlled blood sugars within normal ranges  Interventions:   Diabetes Interventions:  (Status:  Goal on track:  Yes.) Long Term Goal Assessed patient's understanding of A1c goal: <6.5% Provided education to patient about basic DM disease process Reviewed medications with patient and discussed importance of medication adherence Counseled on importance of regular laboratory monitoring as prescribed Discussed plans with patient for ongoing care management follow up and provided patient with direct contact information for care management team Advised patient, providing education and rationale, to check cbg daily prior to breakfast and record, calling PCP for findings outside established parameters Lab Results  Component Value Date   HGBA1C 7.5 (H) 06/06/2024    A1C up to 7.5 from 6.0 three months ago.  Patient reports fasting BG has improved, ( 107 on 07/19/24) Patient still has questions about taking ozempic  at 1 or 2 mg, and still taking glimiperide 4 mg daily, although this was removed  from her med list.   Patient Self-Care Activities:  Attend all scheduled provider appointments Call pharmacy for medication refills 3-7 days in advance of running out of medications Call provider office for new concerns or questions  Perform all self care activities independently  Take medications as prescribed    Plan:  No further follow up required: Patient declined to continue with CCM RNCM follow up visits.Message sent to PCP regarding medication concerns.             Please call the Suicide and Crisis Lifeline: 988 call the [place]  National Suicide Prevention Lifeline: 986-486-8228 or TTY: 442-510-5112 TTY 405-778-3805) to talk to a trained counselor call 1-800-273-TALK (toll free, 24 hour hotline) if you are experiencing a Mental Health or Behavioral Health Crisis or need someone to talk to.  Patient verbalizes understanding of instructions and care plan provided today and agrees to view in MyChart. Active MyChart status and patient understanding of how to access instructions and care plan via MyChart confirmed with patient.     SIGNATURE  Olam Idol BSN RN CCM [place]  [facility], [facility] RN Care Manager Direct Dial: [phone] Fax: [phone]

## 2024-07-19 NOTE — Patient Outreach (Signed)
 Complex Care Management   Visit Note  07/19/2024  Name:  Rachel Hensley MRN: [id number] DOB: 02/18/1956  Situation: Referral received for Complex Care Management related to Diabetes with Complications I obtained verbal consent from Patient.  Visit completed with patient  on the phone  Background:   Past Medical History:  Diagnosis Date   A-fib [facility])    Angioedema 04/05/2018   GERD (gastroesophageal reflux disease)    History of migraine    Hyperlipidemia    Hypertension    Hypoglycemia due to type 2 diabetes mellitus (HCC) 08/21/2020   Mixed hyperlipidemia    Nontoxic multinodular goiter    Other vitamin B12 deficiency anemias    Primary insomnia 05/20/2020   Solitary pulmonary nodule    Type 2 diabetes mellitus with other specified complication (HCC)     Assessment: Patient Reported Symptoms:  Cognitive Cognitive Status: Alert and oriented to person, place, and time      Neurological Neurological Review of Symptoms: No symptoms reported    HEENT HEENT Symptoms Reported: No symptoms reported      Cardiovascular Cardiovascular Symptoms Reported: No symptoms reported Does patient have uncontrolled Hypertension?: No Is patient checking Blood Pressure at home?: Yes Patient's Recent BP reading at home: patient reports BP 103/72 a week ago Cardiovascular Management Strategies: Medication therapy, Routine screening  Respiratory Respiratory Symptoms Reported: No symptoms reported    Endocrine Endocrine Symptoms Reported: No symptoms reported Is patient diabetic?: Yes Is patient checking blood sugars at home?: Yes List most recent blood sugar readings, include date and time of day: fasting 107 07/19/24  some lows in the 80s. Endocrine Self-Management Outcome: 3 (uncertain)  Gastrointestinal Gastrointestinal Symptoms Reported: Diarrhea Additional Gastrointestinal Details: patient reports diarrhea has improved somewhat Gastrointestinal Management Strategies: Fluid  modification, Diet modification, Medication therapy    Genitourinary Genitourinary Symptoms Reported: No symptoms reported    Integumentary Integumentary Symptoms Reported: No symptoms reported    Musculoskeletal Musculoskelatal Symptoms Reviewed: Muscle pain Other Musculoskeletal Symptoms: patient states had a near fall la few weeks ago,  twisted back. using heat, stretching Musculoskeletal Management Strategies: Medication therapy, Routine screening, Adequate rest, Exercise Falls in the past year?: No Patient at Risk for Falls Due to: Impaired balance/gait  Psychosocial Psychosocial Symptoms Reported: No symptoms reported            07/06/2024    3:22 PM  Depression screen PHQ 2/9  Decreased Interest 0  Down, Depressed, Hopeless 0  PHQ - 2 Score 0  Altered sleeping 1  Tired, decreased energy 0  Change in appetite 0  Feeling bad or failure about yourself  0  Trouble concentrating 0  Moving slowly or fidgety/restless 0  Suicidal thoughts 0  PHQ-9 Score 1    There were no vitals filed for this visit.  Medications Reviewed Today     Reviewed by Lonzell Planas, RN (Registered Nurse) on 07/19/24 at 1004  Med List Status: <None>   Medication Order Taking? Sig Documenting Provider Last Dose Status Informant  apixaban  (ELIQUIS ) 5 MG TABS tablet [id number]  Take 1 tablet (5 mg total) by mouth 2 (two) times daily. Cox, Kirsten, MD  Active   atorvastatin  (LIPITOR) 20 MG tablet [id number]  Take 1 tablet by mouth once daily Cox, Kirsten, MD  Active   cetirizine (ZYRTEC) 10 MG tablet [id number]  Take 10 mg by mouth daily. [provider]  Active   diltiazem (CARDIZEM) 30 MG tablet [id number]  Take 30 mg by mouth every 6 (six) hours  as needed (HR). [provider]  Active   empagliflozin  (JARDIANCE ) 25 MG TABS tablet [id number] Yes Take 1 tablet (25 mg total) by mouth daily. Take one tablet by mouth daily Cox, Kirsten, MD  Active   EPINEPHrine  0.3 mg/0.3 mL IJ SOAJ injection  [id number]  Inject 0.3 mg into the muscle once. [provider]  Active   flecainide  (TAMBOCOR ) 50 MG tablet [id number]  Take 1 tablet by mouth twice daily Krasowski, Robert J, MD  Active   icosapent  Ethyl (VASCEPA ) 1 g capsule [id number]  Take 2 capsules (2 g total) by mouth 2 (two) times daily. Cox, Kirsten, MD  Active   magnesium oxide (MAG-OX) 400 (240 Mg) MG tablet [id number]  Take 800 mg by mouth 2 (two) times daily. [provider]  Active   metoprolol  succinate (TOPROL -XL) 50 MG 24 hr tablet [id number]  TAKE 1 TABLET BY MOUTH ONCE DAILY.  TAKE WITH OR IMMEDIATELY FOLLOWING A MEAL Bernie Lamar PARAS, MD  Active   omeprazole  (PRILOSEC) 20 MG capsule [id number]  Take 1 capsule by mouth twice daily  Patient taking differently: Take 20 mg by mouth as needed.   Cox, Kirsten, MD  Active            Med Note DANICE, Christna Kulick   Fri May 19, 2024  9:19 AM) Taking 1 per day for next 3 days, then 1 every other day for one week, then stop as long as symptoms dont return.  ondansetron  (ZOFRAN  ODT) 4 MG disintegrating tablet [id number] Yes Take 1 tablet (4 mg total) by mouth every 8 (eight) hours as needed for nausea or vomiting. Charlene Clotilda PARAS, NP  Active            Med Note DANICE, Ashby Leflore   Mon Apr 03, 2024  2:56 PM) Has on hand, not needed  promethazine  (PHENERGAN ) 25 MG tablet [id number] Yes Take 1 tablet (25 mg total) by mouth every 8 (eight) hours as needed for nausea or vomiting. Charlene Clotilda PARAS, NP  Active            Med Note DANICE, Paul Torpey   Mon Apr 03, 2024  2:57 PM) Has on hand, has not needed  Semaglutide , 1 MG/DOSE, 4 MG/3ML SOPN [id number] Yes Inject 1 mg as directed once a week. Cox, Kirsten, MD  Active             Recommendation:   Continue Current Plan of Care  Follow Up Plan:   Patient has met all care management goals. Care Management case will be closed. Patient has been provided contact information should new needs arise.  Message to PCP regarding medication  questions.    SIG  Olam Idol BSN RN CCM [place]  [facility], [facility] RN Care Manager Direct Dial: [phone] Fax: [phone]

## 2024-07-20 ENCOUNTER — Telehealth: Payer: Self-pay

## 2024-07-20 NOTE — Patient Outreach (Signed)
 RNCM Telephone Encounter Note  Called patient and relayed PCP instructions to increase ozempic  to 2mg  weekly, and continue glimiperide, unless experiences low blood sugar.  Patient expressed understanding, and no further questions.   Olam Idol BSN RN CCM [place]  [facility], [facility] RN Care Manager Direct Dial: [phone] Fax: [phone]

## 2024-08-02 ENCOUNTER — Other Ambulatory Visit (HOSPITAL_COMMUNITY): Payer: Self-pay

## 2024-08-04 ENCOUNTER — Other Ambulatory Visit (HOSPITAL_COMMUNITY): Payer: Self-pay

## 2024-08-07 ENCOUNTER — Telehealth: Payer: Self-pay | Admitting: Family Medicine

## 2024-08-07 ENCOUNTER — Other Ambulatory Visit: Payer: Self-pay

## 2024-08-07 NOTE — Telephone Encounter (Signed)
 Copied from CRM [id number]. Topic: Clinical - Prescription Issue >> Aug 07, 2024  4:08 PM Avram MATSU wrote: Reason for CRM: Solo is calling from [facility] and stated that her benefits covers 3ml 28 days or 9ml 84 days. The PA was sent in a quantity of 9ml for 30 days which is over the benefit. Please call to confirm the quantity   [id number] option 2 >> Aug 07, 2024  4:19 PM Avram G wrote: Medication was for weight loss

## 2024-08-07 NOTE — Telephone Encounter (Signed)
 PA waiting for approval or denial. PA was NOT submitted for 9ml for 30 days. Was submitted accurately.

## 2024-08-12 ENCOUNTER — Other Ambulatory Visit (HOSPITAL_COMMUNITY): Payer: Self-pay

## 2024-08-25 ENCOUNTER — Ambulatory Visit
Admission: RE | Admit: 2024-08-25 | Discharge: 2024-08-25 | Disposition: A | Discharge status: Home / Self Care | Source: Ambulatory Visit | Attending: Family Medicine | Admitting: Family Medicine

## 2024-08-25 DIAGNOSIS — Z1231 Encounter for screening mammogram for malignant neoplasm of breast: Secondary | ICD-10-CM

## 2024-09-14 NOTE — Progress Notes (Signed)
 "  Subjective:  Patient ID: Rachel Hensley, female    DOB: 12/16/1956  Age: 67 y.o. MRN: [id number]  Chief Complaint  Patient presents with   Medical Management of Chronic Issues    HPI: Discussed the use of AI scribe software for clinical note transcription with the patient, who gave verbal consent to proceed.  History of Present Illness Rachel Hensley is a 68 year old female with diabetes, atrial fibrillation, and spinal stenosis who presents for medication management and follow-up.  Glycemic variability - Diabetes managed with Ozempic  2 mg once weekly, Jardiance  25 mg daily, and glimepiride  4 mg daily - Blood glucose levels fluctuate, with hypoglycemic episodes in the 70s and occasional hyperglycemia after consuming ice cream - Currently out of glimeperide. Last dose was yesterday.   Atrial fibrillation and palpitations - Atrial fibrillation managed with flecainide  50 mg twice daily, metoprolol  XL 50 mg once daily, and diltiazem 30 mg as needed for palpitations - No recent need for diltiazem - Concern about magnesium supplementation, as higher doses have caused diarrhea  Dyslipidemia - Takes Vascepa  1 gram, two capsules twice daily, and atorvastatin  20 mg once daily - Recent lipid panel: total cholesterol 165 mg/dL, LDL 65 mg/dL, HDL 45 mg/dL, triglycerides 728 mg/dL - Previously trialed fenofibrate  but discontinued due to diarrhea, however in retrospect it could possibly be related to magnesium supplementation  Chronic back pain and sciatica - Chronic lower back pain due to spinal stenosis and sciatica, radiating to the left leg - Pain worsens with movement - Numbness in left leg when standing or walking - Uses Tylenol for pain relief - Has not pursued injections or surgery due to concerns about efficacy and risks  Constitutional and systemic symptoms - No fevers, chills, sweats, earache, sore throat, chest pain, breathing problems, abdominal pain, bowel problems,  bladder issues, or tingling in feet or hands       09/15/2024    9:57 AM 07/06/2024    3:22 PM 06/06/2024    8:14 AM 04/13/2024   11:18 AM 12/01/2023    8:29 AM  Depression screen PHQ 2/9  Decreased Interest 0 0 0 0 0  Down, Depressed, Hopeless 0 0 0 0 0  PHQ - 2 Score 0 0 0 0 0  Altered sleeping  1 1  1   Tired, decreased energy  0 0  1  Change in appetite  0 0  0  Feeling bad or failure about yourself   0 0  0  Trouble concentrating  0 0  0  Moving slowly or fidgety/restless  0 0  0  Suicidal thoughts  0 0  0  PHQ-9 Score  1 1  2   Difficult doing work/chores   Not difficult at all  Not difficult at all        09/15/2024    9:56 AM  Fall Risk   Falls in the past year? 0  Number falls in past yr: 0  Injury with Fall? 0  Risk for fall due to : No Fall Risks  Follow up Falls evaluation completed    Patient Care Team: Sherre Clapper, MD as PCP - General (Family Medicine) Bernie Lamar JINNY, MD as Consulting Physician (Cardiology)   Review of Systems  All other systems reviewed and are negative.   Current Outpatient Medications on File Prior to Visit  Medication Sig Dispense Refill   atorvastatin  (LIPITOR) 20 MG tablet Take 1 tablet by mouth once daily 90 tablet 0   cetirizine (  ZYRTEC) 10 MG tablet Take 10 mg by mouth daily.     diltiazem (CARDIZEM) 30 MG tablet Take 30 mg by mouth every 6 (six) hours as needed (HR).     empagliflozin  (JARDIANCE ) 25 MG TABS tablet Take 1 tablet (25 mg total) by mouth daily. Take one tablet by mouth daily 90 tablet 3   EPINEPHrine  0.3 mg/0.3 mL IJ SOAJ injection Inject 0.3 mg into the muscle once.     flecainide  (TAMBOCOR ) 50 MG tablet Take 1 tablet by mouth twice daily 180 tablet 2   icosapent  Ethyl (VASCEPA ) 1 g capsule Take 2 capsules (2 g total) by mouth 2 (two) times daily. 360 capsule 1   magnesium oxide (MAG-OX) 400 (240 Mg) MG tablet Take 800 mg by mouth 2 (two) times daily.     metoprolol  succinate (TOPROL -XL) 50 MG 24 hr tablet TAKE  1 TABLET BY MOUTH ONCE DAILY.  TAKE WITH OR IMMEDIATELY FOLLOWING A MEAL 90 tablet 2   omeprazole  (PRILOSEC) 20 MG capsule Take 1 capsule by mouth twice daily (Patient taking differently: Take 20 mg by mouth as needed.) 180 capsule 0   ondansetron  (ZOFRAN  ODT) 4 MG disintegrating tablet Take 1 tablet (4 mg total) by mouth every 8 (eight) hours as needed for nausea or vomiting. 30 tablet 0   promethazine  (PHENERGAN ) 25 MG tablet Take 1 tablet (25 mg total) by mouth every 8 (eight) hours as needed for nausea or vomiting. 20 tablet 0   Semaglutide , 2 MG/DOSE, 8 MG/3ML SOPN Inject 2 mg as directed once a week. 9 mL 0   No current facility-administered medications on file prior to visit.   Past Medical History:  Diagnosis Date   A-fib [facility])    Angioedema 04/05/2018   GERD (gastroesophageal reflux disease)    History of migraine    Hyperlipidemia    Hypertension    Hypoglycemia due to type 2 diabetes mellitus (HCC) 08/21/2020   Mixed hyperlipidemia    Nontoxic multinodular goiter    Other vitamin B12 deficiency anemias    Primary insomnia 05/20/2020   Solitary pulmonary nodule    Type 2 diabetes mellitus with other specified complication [facility])    Past Surgical History:  Procedure Laterality Date   ABDOMINAL HYSTERECTOMY  06/1993   partial; still has both ovaries   HYSTEROTOMY     partial    Family History  Problem Relation Age of Onset   Coronary artery disease Mother    Glaucoma Mother    Colon cancer Father    Diabetes Sister    Atrial fibrillation Sister    Kidney cancer Brother    Diabetes type II Brother    Diabetes Brother    Prostate cancer Brother    Diabetes Brother    Coronary artery disease Brother    Asthma Son        childhood asthma   Arrhythmia Son    Atrial fibrillation Son    Parkinsonism Son    Eczema Grandchild    Atopy Grandchild    Colon cancer Paternal Uncle    Throat cancer Cousin    Lymphoma Maternal Aunt    Breast cancer Paternal Aunt     Allergic rhinitis Neg Hx    Angioedema Neg Hx    Immunodeficiency Neg Hx    Urticaria Neg Hx    Social History   Socioeconomic History   Marital status: Widowed    Spouse name: Not on file   Number of children: 3   Years  of education: Not on file   Highest education level: GED or equivalent  Occupational History   Occupation: RETIRED SEWER  Tobacco Use   Smoking status: Never   Smokeless tobacco: Never  Vaping Use   Vaping status: Never Used  Substance and Sexual Activity   Alcohol use: Never   Drug use: Never   Sexual activity: Not Currently  Other Topics Concern   Not on file  Social History Narrative   Not on file   Social Drivers of Health   Financial Resource Strain: Low Risk  (07/06/2024)   Overall Financial Resource Strain (CARDIA)    Difficulty of Paying Living Expenses: Not hard at all  Food Insecurity: No Food Insecurity (07/06/2024)   Hunger Vital Sign    Worried About Running Out of Food in the Last Year: Never true    Ran Out of Food in the Last Year: Never true  Transportation Needs: No Transportation Needs (07/06/2024)   PRAPARE - Administrator, Civil Service (Medical): No    Lack of Transportation (Non-Medical): No  Physical Activity: Inactive (07/06/2024)   Exercise Vital Sign    Days of Exercise per Week: 0 days    Minutes of Exercise per Session: 0 min  Stress: No Stress Concern Present (07/06/2024)   [employer] of Occupational Health - Occupational Stress Questionnaire    Feeling of Stress: Not at all  Social Connections: Moderately Isolated (07/06/2024)   Social Connection and Isolation Panel    Frequency of Communication with Friends and Family: More than three times a week    Frequency of Social Gatherings with Friends and Family: Three times a week    Attends Religious Services: More than 4 times per year    Active Member of Clubs or Organizations: No    Attends Banker Meetings: Never    Marital Status: Widowed     Objective:  BP 138/72 (BP Location: Right Arm, Patient Position: Sitting)   Pulse 72   Temp (!) 97.1 F (36.2 C) (Temporal)   Ht 5' 3 (1.6 m)   Wt 149 lb (67.6 kg)   LMP  (LMP Unknown)   SpO2 99%   BMI 26.39 kg/m      09/15/2024    9:53 AM 07/06/2024    3:17 PM 06/06/2024    8:12 AM  BP/Weight  Systolic BP 138 -- 126  Diastolic BP 72 -- 62  Wt. (Lbs) 149 142 142  BMI 26.39 kg/m2 25.15 kg/m2 25.15 kg/m2    Physical Exam Vitals reviewed.  Constitutional:      Appearance: Normal appearance. She is normal weight.  Neck:     Vascular: No carotid bruit.  Cardiovascular:     Rate and Rhythm: Normal rate and regular rhythm.     Pulses: Normal pulses.     Heart sounds: Normal heart sounds.  Pulmonary:     Effort: Pulmonary effort is normal. No respiratory distress.     Breath sounds: Normal breath sounds.  Abdominal:     General: Abdomen is flat. Bowel sounds are normal.     Palpations: Abdomen is soft.     Tenderness: There is no abdominal tenderness.  Neurological:     Mental Status: She is alert and oriented to person, place, and time.  Psychiatric:        Mood and Affect: Mood normal.        Behavior: Behavior normal.      Diabetic foot exam was performed with the  following findings:   No deformities, ulcerations, or other skin breakdown Normal sensation of 10g monofilament Intact posterior tibialis and dorsalis pedis pulses      Lab Results  Component Value Date   WBC 8.8 06/06/2024   HGB 15.9 06/06/2024   HCT 46.6 06/06/2024   PLT 242 06/06/2024   GLUCOSE 149 (H) 09/15/2024   CHOL 174 06/06/2024   TRIG 214 (H) 06/06/2024   HDL 47 06/06/2024   LDLCALC 91 06/06/2024   ALT 28 09/15/2024   AST 21 09/15/2024   NA 140 09/15/2024   K 4.2 09/15/2024   CL 102 09/15/2024   CREATININE 0.76 09/15/2024   BUN 28 (H) 09/15/2024   CO2 20 09/15/2024   TSH 0.777 01/19/2023   HGBA1C 6.3 09/15/2024      Assessment & Plan:   Assessment & Plan Diabetic  glomerulopathy (HCC)  Orders:   Microalbumin / creatinine urine ratio   POCT glycosylated hemoglobin (Hb A1C)   glimepiride  (AMARYL ) 4 MG tablet; Take 1 tablet (4 mg total) by mouth daily with breakfast.  Paroxysmal atrial fibrillation (HCC) At goal Continue current medications. Continue flecainide , metoprolol  and eliquis .  Orders:   apixaban  (ELIQUIS ) 5 MG TABS tablet; Take 1 tablet (5 mg total) by mouth 2 (two) times daily.   Mixed hyperlipidemia - Not at goal. - Start fenofibrate  48 mg to manage triglycerides. Low dose) - Continue atorvastatin  20 mg daily. - Continue Vascepa  1 g, two capsules twice daily. - Order liver and kidney function tests.  Orders:   POCT Lipid Panel   fenofibrate  micronized (LOFIBRA) 67 MG capsule; Take 1 capsule (67 mg total) by mouth daily before breakfast.  Back pain of lumbar region with sciatica - Start gabapentin  100 mg three times daily. - Continue Tylenol for pain management. - Consider increasing gabapentin  dose if no improvement after three weeks. - Does not wish to pursue ESI yet.   Orders:   gabapentin  (NEURONTIN ) 100 MG capsule; Take 1 capsule (100 mg total) by mouth 3 (three) times daily.  Essential hypertension, benign Well controlled.  No changes to medicines. Continue metoprolol  50 mg daily. Continue Aspirin 81 mg daily. Continue to work on eating a healthy diet and exercise.  Labs drawn today.   Orders:   Comprehensive metabolic panel with GFR   Hypomagnesemia Check magnesium level. Orders:   Magnesium   History of angiotensin converting enzyme inhibitor (ACE-I) allergy Intolerant to ACE inhibitor - developed angioedema       Body mass index is 26.39 kg/m.  Meds ordered this encounter  Medications   glimepiride  (AMARYL ) 4 MG tablet    Sig: Take 1 tablet (4 mg total) by mouth daily with breakfast.    Dispense:  90 tablet    Refill:  1   fenofibrate  micronized (LOFIBRA) 67 MG capsule    Sig: Take 1 capsule  (67 mg total) by mouth daily before breakfast.    Dispense:  30 capsule    Refill:  11    Patient to try lower dose. Reaction was possibly diarrhea, but may have been other medications.   gabapentin  (NEURONTIN ) 100 MG capsule    Sig: Take 1 capsule (100 mg total) by mouth 3 (three) times daily.    Dispense:  90 capsule    Refill:  3   apixaban  (ELIQUIS ) 5 MG TABS tablet    Sig: Take 1 tablet (5 mg total) by mouth 2 (two) times daily.    Dispense:  180 tablet  Refill:  1    Orders Placed This Encounter  Procedures   Microalbumin / creatinine urine ratio   Comprehensive metabolic panel with GFR   Magnesium   POCT Lipid Panel   POCT glycosylated hemoglobin (Hb A1C)       Follow-up: Return in about 4 months (around 01/15/2025) for chronic fasting.   I,Marla I Leal-Borjas,acting as a scribe for Abigail Free, MD.,have documented all relevant documentation on the behalf of Abigail Free, MD,as directed by  Abigail Free, MD while in the presence of Abigail Free, MD.    An After Visit Summary was printed and given to the patient.  I attest that I have reviewed this visit and agree with the plan scribed by my staff.   Abigail Free, MD [facility] [phone]    "

## 2024-09-15 ENCOUNTER — Ambulatory Visit: Admitting: Family Medicine

## 2024-09-15 ENCOUNTER — Telehealth: Payer: Self-pay

## 2024-09-15 VITALS — BP 138/72 | HR 72 | Temp 97.1°F | Ht 63.0 in | Wt 149.0 lb

## 2024-09-15 DIAGNOSIS — E782 Mixed hyperlipidemia: Secondary | ICD-10-CM | POA: Diagnosis not present

## 2024-09-15 DIAGNOSIS — M5440 Lumbago with sciatica, unspecified side: Secondary | ICD-10-CM | POA: Diagnosis not present

## 2024-09-15 DIAGNOSIS — E1121 Type 2 diabetes mellitus with diabetic nephropathy: Secondary | ICD-10-CM

## 2024-09-15 DIAGNOSIS — Z888 Allergy status to other drugs, medicaments and biological substances status: Secondary | ICD-10-CM

## 2024-09-15 DIAGNOSIS — I480 Paroxysmal atrial fibrillation: Secondary | ICD-10-CM | POA: Diagnosis not present

## 2024-09-15 DIAGNOSIS — I10 Essential (primary) hypertension: Secondary | ICD-10-CM

## 2024-09-15 LAB — POCT LIPID PANEL
HDL: 45
LDL: 65
Non-HDL: 119
TC/HDL: 1.4
TC: 165
TRG: 271

## 2024-09-15 LAB — POCT GLYCOSYLATED HEMOGLOBIN (HGB A1C): HbA1c POC (<> result, manual entry): 6.3 % (ref 4.0–5.6)

## 2024-09-15 MED ORDER — FENOFIBRATE 67 MG PO CAPS: 67.0000 mg | ORAL_CAPSULE | Freq: Every day | ORAL | 11 refills | Status: DC

## 2024-09-15 MED ORDER — GABAPENTIN 100 MG PO CAPS: 100.0000 mg | ORAL_CAPSULE | Freq: Three times a day (TID) | ORAL | 3 refills | Status: AC

## 2024-09-15 MED ORDER — GLIMEPIRIDE 4 MG PO TABS: 4.0000 mg | ORAL_TABLET | Freq: Every day | ORAL | 1 refills | Status: AC

## 2024-09-15 MED ORDER — APIXABAN 5 MG PO TABS: 5.0000 mg | ORAL_TABLET | Freq: Two times a day (BID) | ORAL | 1 refills | Status: AC

## 2024-09-15 NOTE — Progress Notes (Signed)
   09/15/2024  Patient ID: Dickey JINNY Mo, female   DOB: 03/17/1956, 68 y.o.   MRN: [id number]  Spoke with insurance representative to confirm pricing of DM meds through HTA plan per PCP request. Appears formulary has been updated for [facility] patients, preventative meds are Tier 6 and considered $0 copay. Messaged PCP with information. Offered to reach out to patient.   Lang Sieve, PharmD, BCGP Clinical Pharmacist  [phone]

## 2024-09-15 NOTE — Progress Notes (Signed)
   09/15/2024  Patient ID: Rachel Hensley Mo, female   DOB: 19-Apr-1956, 68 y.o.   MRN: [id number]  Spoke with patient, agreed to review 2026 plan information for once it comes out for next year.   Lang Sieve, PharmD, BCGP Clinical Pharmacist  [phone]

## 2024-09-15 NOTE — Patient Instructions (Signed)
  VISIT SUMMARY: Today, we reviewed your diabetes, atrial fibrillation, cholesterol levels, and chronic back pain. We made some adjustments to your medications and discussed new treatment options to help manage your conditions.  YOUR PLAN: TYPE 2 DIABETES MELLITUS WITH DIABETIC NEPHROPATHY: Your blood sugar levels are generally stable, but you have occasional high readings after eating certain foods. -Continue taking Ozempic  2 mg once weekly, Jardiance  25 mg daily, and glimepiride  4 mg daily. -We have refilled your glimepiride  prescription. -We will order a urine test to check for protein in your urine.  ATRIAL FIBRILLATION: Your heart condition is stable, and you have not had recent palpitations. -Continue taking flecainide  50 mg twice daily, metoprolol  XL 50 mg once daily, and diltiazem 30 mg as needed. -We will check your magnesium levels to guide your supplementation.  MIXED HYPERLIPIDEMIA: Your cholesterol levels are mostly controlled, but your triglycerides are still high. -Start taking fenofibrate  48 mg to help lower your triglycerides. -Continue taking atorvastatin  20 mg daily and Vascepa  1 g, two capsules twice daily. -We will order liver and kidney function tests to monitor your health.  CHRONIC LOW BACK PAIN WITH LUMBAR SPINAL STENOSIS AND LEFT LOWER EXTREMITY RADICULOPATHY: You have ongoing back pain that radiates to your left leg, which worsens with movement. -Start taking gabapentin  100 mg three times daily to help manage the pain. -Continue using Tylenol for pain relief. -Consider increasing the gabapentin  dose if there is no improvement after three weeks.                      Contains text generated by Abridge.                                 Contains text generated by Abridge.

## 2024-09-16 LAB — COMPREHENSIVE METABOLIC PANEL WITH GFR
ALT: 28 IU/L (ref 0–32)
AST: 21 IU/L (ref 0–40)
Albumin: 4.4 g/dL (ref 3.9–4.9)
Alkaline Phosphatase: 104 IU/L (ref 49–135)
BUN/Creatinine Ratio: 37 — ABNORMAL HIGH (ref 12–28)
BUN: 28 mg/dL — ABNORMAL HIGH (ref 8–27)
Bilirubin Total: 0.4 mg/dL (ref 0.0–1.2)
CO2: 20 mmol/L (ref 20–29)
Calcium: 9.6 mg/dL (ref 8.7–10.3)
Chloride: 102 mmol/L (ref 96–106)
Creatinine, Ser: 0.76 mg/dL (ref 0.57–1.00)
Globulin, Total: 3 g/dL (ref 1.5–4.5)
Glucose: 149 mg/dL — ABNORMAL HIGH (ref 70–99)
Potassium: 4.2 mmol/L (ref 3.5–5.2)
Sodium: 140 mmol/L (ref 134–144)
Total Protein: 7.4 g/dL (ref 6.0–8.5)
eGFR: 84 mL/min/1.73 (ref >59)

## 2024-09-16 LAB — MAGNESIUM: Magnesium: 2 mg/dL (ref 1.6–2.3)

## 2024-09-16 LAB — MICROALBUMIN / CREATININE URINE RATIO
Creatinine, Urine: 59.3 mg/dL
Microalb/Creat Ratio: 112 mg/g{creat} — ABNORMAL HIGH (ref 0–29)
Microalbumin, Urine: 66.3 ug/mL

## 2024-09-16 NOTE — Assessment & Plan Note (Signed)
-   Start fenofibrate  48 mg to manage triglycerides. - Continue atorvastatin  20 mg daily. - Continue Vascepa  1 g, two capsules twice daily. - Order liver and kidney function tests.

## 2024-09-16 NOTE — Assessment & Plan Note (Signed)
-   Start gabapentin  100 mg three times daily. - Continue Tylenol for pain management. - Consider increasing gabapentin  dose if no improvement after three weeks. - Does not wish to pursue ESI yet.   Orders:   gabapentin  (NEURONTIN ) 100 MG capsule; Take 1 capsule (100 mg total) by mouth 3 (three) times daily.

## 2024-09-17 ENCOUNTER — Ambulatory Visit: Payer: Self-pay | Admitting: Family Medicine

## 2024-09-20 ENCOUNTER — Encounter: Payer: Self-pay | Admitting: Family Medicine

## 2024-09-20 DIAGNOSIS — Z888 Allergy status to other drugs, medicaments and biological substances status: Secondary | ICD-10-CM

## 2024-09-20 HISTORY — DX: Allergy status to other drugs, medicaments and biological substances: Z88.8

## 2024-09-20 NOTE — Assessment & Plan Note (Signed)
 Intolerant to ACE inhibitor - developed angioedema

## 2024-09-20 NOTE — Assessment & Plan Note (Signed)
  Orders:   Microalbumin / creatinine urine ratio   POCT glycosylated hemoglobin (Hb A1C)   glimepiride  (AMARYL ) 4 MG tablet; Take 1 tablet (4 mg total) by mouth daily with breakfast.

## 2024-09-20 NOTE — Assessment & Plan Note (Signed)
 Check magnesium level. Orders:   Magnesium

## 2024-09-20 NOTE — Assessment & Plan Note (Signed)
 At goal Continue current medications. Continue flecainide , metoprolol  and eliquis .  Orders:   apixaban  (ELIQUIS ) 5 MG TABS tablet; Take 1 tablet (5 mg total) by mouth 2 (two) times daily.

## 2024-09-20 NOTE — Assessment & Plan Note (Signed)
 Well controlled.  No changes to medicines. Continue metoprolol  50 mg daily. Continue Aspirin 81 mg daily. Continue to work on eating a healthy diet and exercise.  Labs drawn today.   Orders:   Comprehensive metabolic panel with GFR

## 2024-09-29 ENCOUNTER — Other Ambulatory Visit: Payer: Self-pay

## 2024-09-29 NOTE — Progress Notes (Signed)
 09/29/2024 Name: Rachel Hensley MRN: [id number] DOB: [birth year]  Chief Complaint  Patient presents with   Diabetes   Medication Management   Medication Assistance    Rachel Hensley is a 68 y.o. year old female who presented for a telephone visit.   They were referred to the pharmacist by their PCP for assistance in managing diabetes, medication access, and complex medication management.    Subjective:  Care Team: Primary Care Provider: Sherre Clapper, Hensley ; Next Scheduled Visit:  Future Appointments  Date Time Provider Department Center  11/15/2024  9:30 AM [facility]LAB [facility] None  11/15/2024 10:00 AM Rachel Hensley LABOR, [place] [facility] None  11/17/2024  2:20 PM Rachel Lamar JINNY, [place] [facility]-ASHE None  01/17/2025  8:40 AM Rachel Clapper, [place] [facility]  07/12/2025  3:10 PM COX-ANNUAL WELLNESS VISIT [facility]     Medication Access/Adherence  Current Pharmacy:  [facility] [address], [place] - [address] [place] [address] Phone: [phone] Fax: [phone]  [facility] - [place], [place] - [address] [place] [address] Phone: [phone] Fax: [phone]  DARRYLE LONG - [facility] [address] [place] [address] Phone: [phone] Fax: [phone]  [facility] - [place], [place] - [address] [place] [address] Phone: [phone] Fax: [phone]  [facility] - [place], [place] - [address] [place] [address] Phone: [phone] Fax: [phone]   Patient reports affordability concerns with their medications: not at this time Patient reports access/transportation concerns to their pharmacy: No  Patient reports adherence concerns with their medications:  No     Diabetes:  Current medications: glimepiride  2mg  daily, ozempic  2mg  weekly, jardiance  25mg  daily Medications tried  in the past: metformin  (GI upset)   Current glucose readings: last A1c 6.6%  Patient denies hypoglycemic s/sx including dizziness, shakiness, sweating. Patient denies hyperglycemic symptoms including polyuria, polydipsia, polyphagia, nocturia, neuropathy, blurred vision.  Current physical activity: limited d/t back pain   Current medication access support: healthwell for vascepa ; had pap for jardiance  and ozempic , now planning on utilizing insurance benefits - 2026 HTA CSNP  Macrovascular and Microvascular Risk Reduction:  Statin? yes (atorvastatin ); ACEi/ARB?  Last urinary albumin/creatinine ratio:  Lab Results  Component Value Date   MICRALBCREAT 112 (H) 09/15/2024   MICRALBCREAT 108 (H) 06/06/2024   MICRALBCREAT 74 (H) 03/02/2024   MICRALBCREAT 39 (H) 07/29/2023   MICRALBCREAT 41 (H) 04/09/2023   MICRALBCREAT 74 (H) 12/04/2022   MICRALBCREAT 43 (H) 07/28/2022   MICRALBCREAT 59 (H) 01/21/2022   Last eye exam:  Lab Results  Component Value Date   HMDIABEYEEXA Retinopathy (A) 04/06/2024   Last foot exam: 09/15/2024 Tobacco Use:  Tobacco Use: Low Risk  (09/20/2024)   Patient History    Smoking Tobacco Use: Never    Smokeless Tobacco Use: Never    Passive Exposure: Not on file     Objective:  Lab Results  Component Value Date   HGBA1C 6.3 09/15/2024    Lab Results  Component Value Date   CREATININE 0.76 09/15/2024   BUN 28 (H) 09/15/2024   NA 140 09/15/2024   K 4.2 09/15/2024   CL 102 09/15/2024   CO2 20 09/15/2024    Lab Results  Component Value Date   CHOL 174 06/06/2024   HDL 47 06/06/2024   LDLCALC 91 06/06/2024   TRIG 214 (  H) 06/06/2024   CHOLHDL 3.7 06/06/2024    Medications Reviewed Today     Reviewed by Rachel Hensley, [facility] (Pharmacist) on 09/29/24 at 1132  Med List Status: <None>   Medication Order Taking? Sig Documenting Provider Last Dose Status Informant  apixaban  (ELIQUIS ) 5 MG TABS tablet [id number]  Take 1 tablet (5 mg total) by mouth 2  (two) times daily. Cox, Kirsten, Hensley  Active   atorvastatin  (LIPITOR) 20 MG tablet [id number]  Take 1 tablet by mouth once daily Cox, Kirsten, Hensley  Active   cetirizine (ZYRTEC) 10 MG tablet [id number]  Take 10 mg by mouth daily. Provider, Historical, Hensley  Active   diltiazem (CARDIZEM) 30 MG tablet [id number]  Take 30 mg by mouth every 6 (six) hours as needed (HR). Provider, Historical, Hensley  Active   empagliflozin  (JARDIANCE ) 25 MG TABS tablet [id number]  Take 1 tablet (25 mg total) by mouth daily. Take one tablet by mouth daily Cox, Kirsten, Hensley  Active   EPINEPHrine  0.3 mg/0.3 mL IJ SOAJ injection [id number]  Inject 0.3 mg into the muscle once. Provider, Historical, Hensley  Active   fenofibrate  micronized (LOFIBRA) 67 MG capsule [id number]  Take 1 capsule (67 mg total) by mouth daily before breakfast. Cox, Kirsten, Hensley  Active   flecainide  (TAMBOCOR ) 50 MG tablet [id number]  Take 1 tablet by mouth twice daily Krasowski, Robert Hensley, Hensley  Active   gabapentin  (NEURONTIN ) 100 MG capsule [id number]  Take 1 capsule (100 mg total) by mouth 3 (three) times daily. Cox, Kirsten, Hensley  Active   glimepiride  (AMARYL ) 4 MG tablet [id number]  Take 1 tablet (4 mg total) by mouth daily with breakfast. Cox, Kirsten, Hensley  Active   icosapent  Ethyl (VASCEPA ) 1 g capsule [id number]  Take 2 capsules (2 g total) by mouth 2 (two) times daily. Cox, Kirsten, Hensley  Active   magnesium oxide (MAG-OX) 400 (240 Mg) MG tablet [id number]  Take 800 mg by mouth 2 (two) times daily. Provider, Historical, Hensley  Active   metoprolol  succinate (TOPROL -XL) 50 MG 24 hr tablet [id number]  TAKE 1 TABLET BY MOUTH ONCE DAILY.  TAKE WITH OR IMMEDIATELY FOLLOWING A MEAL Rachel Lamar PARAS, Hensley  Active   omeprazole  (PRILOSEC) 20 MG capsule [id number]  Take 1 capsule by mouth twice daily  Patient taking differently: Take 20 mg by mouth as needed.   Cox, Kirsten, Hensley  Active            Med Note Rachel Hensley   Fri May 19, 2024  9:19 AM) Taking 1 per day for next 3 days, then 1  every other day for one week, then stop as long as symptoms dont return.  ondansetron  (ZOFRAN  ODT) 4 MG disintegrating tablet [id number]  Take 1 tablet (4 mg total) by mouth every 8 (eight) hours as needed for nausea or vomiting. Charlene Clotilda PARAS, NP  Active            Med Note Rachel Hensley   Mon Apr 03, 2024  2:56 PM) Has on hand, not needed  promethazine  (PHENERGAN ) 25 MG tablet [id number]  Take 1 tablet (25 mg total) by mouth every 8 (eight) hours as needed for nausea or vomiting. Charlene Clotilda PARAS, NP  Active            Med Note Rachel Hensley   Mon Apr 03, 2024  2:57 PM) Has on hand, has not needed  Semaglutide , 2 MG/DOSE, 8 MG/3ML SOPN [id number]  Inject 2 mg as directed once a  week. Rachel Clapper, Hensley  Active               Assessment/Plan:   Diabetes: - Currently controlled; goal A1c <7%. Cardiorenal risk reduction is optimized.. Blood pressure is at goal <130/80. LDL is at goal.  - Reviewed long term cardiovascular and renal outcomes of uncontrolled blood sugar. and Reviewed goal A1c, goal fasting, and goal 2 hour post prandial glucose. Recommended to check glucose daily - Recommend to continue current medications. - reviewed insurance plan questions in detail, encouraged patient to discuss any plan comparison questions with medicare counselor. Answered questions regard HTA CNSP 2026 - appears ozempic  and jardiance  will be $0/month copay and elqiuis would be $62/month  Follow Up Plan: f/u PRN. Does not wish to renew any PAP applications at this time. Ensured patient had my direct line for any questions.   Lang Sieve, PharmD, BCGP Clinical Pharmacist  [phone]

## 2024-10-05 ENCOUNTER — Other Ambulatory Visit (HOSPITAL_COMMUNITY): Payer: Self-pay

## 2024-10-05 ENCOUNTER — Telehealth: Payer: Self-pay

## 2024-10-05 NOTE — Telephone Encounter (Signed)
 Pharmacy Patient Advocate Encounter  Insurance verification completed.   The patient is insured through [facility] ADVANTAGE/RX ADVANCE   Ran test claim for Jardiance . Currently a quantity of 30 is a 30 day supply and the co-pay is $0 .   This test claim was processed through [facility]- copay amounts may vary at other pharmacies due to pharmacy/plan contracts, or as the patient moves through the different stages of their insurance plan.

## 2024-10-09 ENCOUNTER — Other Ambulatory Visit: Payer: Self-pay | Admitting: Family Medicine

## 2024-10-09 ENCOUNTER — Other Ambulatory Visit: Payer: Self-pay | Admitting: Cardiology

## 2024-10-30 ENCOUNTER — Other Ambulatory Visit: Payer: Self-pay

## 2024-10-30 ENCOUNTER — Other Ambulatory Visit (HOSPITAL_COMMUNITY): Payer: Self-pay

## 2024-10-30 ENCOUNTER — Other Ambulatory Visit: Payer: Self-pay | Admitting: Family Medicine

## 2024-10-30 MED ORDER — ICOSAPENT ETHYL 1 G PO CAPS
2.0000 g | ORAL_CAPSULE | Freq: Two times a day (BID) | ORAL | 1 refills | Status: AC
  Filled 2024-10-30: qty 360, 90d supply, fill #0

## 2024-11-14 NOTE — Progress Notes (Unsigned)
 [facility]  [address] [place],  [place]  [address] [phone]  Clinic Day:  11/14/2024  Referring physician: Sherre Clapper, MD   HISTORY OF PRESENT ILLNESS:  The patient is a 68 y.o. female  with iron  deficiency anemia.  She last received IV iron  in April 2024.  She comes in today to reassess her labs.  Since her last visit, the patient has been doing fairly well.  She has had sporadic bouts of rectal bleeding from hemorrhoids.  However, she denies any episode being particularly pronounced.  PHYSICAL EXAM:  There were no vitals taken for this visit. Wt Readings from Last 3 Encounters:  09/15/24 149 lb (67.6 kg)  07/06/24 142 lb (64.4 kg)  06/06/24 142 lb (64.4 kg)   There is no height or weight on file to calculate BMI. Performance status (ECOG): 1 - Symptomatic but completely ambulatory Physical Exam Constitutional:      Appearance: Normal appearance. She is not ill-appearing.  HENT:     Mouth/Throat:     Mouth: Mucous membranes are moist.     Pharynx: Oropharynx is clear. No oropharyngeal exudate or posterior oropharyngeal erythema.  Cardiovascular:     Rate and Rhythm: Normal rate and regular rhythm.     Heart sounds: No murmur heard.    No friction rub. No gallop.  Pulmonary:     Effort: Pulmonary effort is normal. No respiratory distress.     Breath sounds: Normal breath sounds. No wheezing, rhonchi or rales.  Abdominal:     General: Bowel sounds are normal. There is no distension.     Palpations: Abdomen is soft. There is no mass.     Tenderness: There is no abdominal tenderness.  Musculoskeletal:        General: No swelling.     Right lower leg: No edema.     Left lower leg: No edema.  Lymphadenopathy:     Cervical: No cervical adenopathy.     Upper Body:     Right upper body: No supraclavicular or axillary adenopathy.     Left upper body: No supraclavicular or axillary adenopathy.     Lower Body: No right inguinal  adenopathy. No left inguinal adenopathy.  Skin:    General: Skin is warm.     Coloration: Skin is not jaundiced.     Findings: No lesion or rash.  Neurological:     General: No focal deficit present.     Mental Status: She is alert and oriented to person, place, and time. Mental status is at baseline.  Psychiatric:        Mood and Affect: Mood normal.        Behavior: Behavior normal.        Thought Content: Thought content normal.    LABS:      Latest Ref Rng & Units 06/06/2024    8:59 AM 05/15/2024    8:30 AM 03/02/2024    9:23 AM  CBC  WBC 3.4 - 10.8 x10E3/uL 8.8  8.5  8.6   Hemoglobin 11.1 - 15.9 g/dL 84.0  84.6  85.0   Hematocrit 34.0 - 46.6 % 46.6  43.9  43.5   Platelets 150 - 450 x10E3/uL 242  227  263       Latest Ref Rng & Units 09/15/2024   10:27 AM 06/06/2024    8:59 AM 03/02/2024    9:23 AM  CMP  Glucose 70 - 99 mg/dL 850  822  893  BUN 8 - 27 mg/dL 28  23  24    Creatinine 0.57 - 1.00 mg/dL 9.23  9.17  9.16   Sodium 134 - 144 mmol/L 140  142  145   Potassium 3.5 - 5.2 mmol/L 4.2  5.0  4.7   Chloride 96 - 106 mmol/L 102  102  103   CO2 20 - 29 mmol/L 20  22  22    Calcium  8.7 - 10.3 mg/dL 9.6  89.7  9.9   Total Protein 6.0 - 8.5 g/dL 7.4  7.5  7.0   Total Bilirubin 0.0 - 1.2 mg/dL 0.4  0.6  0.4   Alkaline Phos 49 - 135 IU/L 104  120  91   AST 0 - 40 IU/L 21  21  20    ALT 0 - 32 IU/L 28  33  22     Latest Reference Range & Units 05/15/24 08:30  Iron  28 - 170 ug/dL 54  UIBC ug/dL 611  TIBC 749 - 549 ug/dL 557  Saturation Ratios 10.4 - 31.8 % 12  Ferritin 11 - 307 ng/mL 101    ASSESSMENT & PLAN:  A 68 y.o. female with iron  deficiency anemia.  I am very pleased as her iron  and hemoglobin levels remain ideal today.  Clinically, the patient appears to be doing well.  As that is the case, I will see her back in 6 months for repeat clinical assessment.  If her iron  and hemoglobin levels remain normal at that time, I will likely turn her care back over to her primary  care office after her next visit.  The patient understands all the plans discussed today and is in agreement with them.  Kyley Solow DELENA Kerns, MD

## 2024-11-15 ENCOUNTER — Inpatient Hospital Stay: Admitting: Oncology

## 2024-11-15 ENCOUNTER — Inpatient Hospital Stay: Attending: Oncology

## 2024-11-15 VITALS — BP 131/64 | HR 67 | Temp 97.8°F | Resp 14 | Ht 63.0 in | Wt 153.0 lb

## 2024-11-15 DIAGNOSIS — D509 Iron deficiency anemia, unspecified: Secondary | ICD-10-CM | POA: Diagnosis present

## 2024-11-15 DIAGNOSIS — D500 Iron deficiency anemia secondary to blood loss (chronic): Secondary | ICD-10-CM | POA: Diagnosis not present

## 2024-11-15 DIAGNOSIS — K649 Unspecified hemorrhoids: Secondary | ICD-10-CM | POA: Diagnosis not present

## 2024-11-15 DIAGNOSIS — K625 Hemorrhage of anus and rectum: Secondary | ICD-10-CM | POA: Diagnosis not present

## 2024-11-15 DIAGNOSIS — E1169 Type 2 diabetes mellitus with other specified complication: Secondary | ICD-10-CM | POA: Insufficient documentation

## 2024-11-15 DIAGNOSIS — E042 Nontoxic multinodular goiter: Secondary | ICD-10-CM | POA: Insufficient documentation

## 2024-11-15 DIAGNOSIS — D508 Other iron deficiency anemias: Secondary | ICD-10-CM

## 2024-11-15 DIAGNOSIS — Z8669 Personal history of other diseases of the nervous system and sense organs: Secondary | ICD-10-CM | POA: Insufficient documentation

## 2024-11-15 DIAGNOSIS — R911 Solitary pulmonary nodule: Secondary | ICD-10-CM | POA: Insufficient documentation

## 2024-11-15 LAB — CBC WITH DIFFERENTIAL (CANCER CENTER ONLY)
Abs Immature Granulocytes: 0.02 K/uL (ref 0.00–0.07)
Basophils Absolute: 0 K/uL (ref 0.0–0.1)
Basophils Relative: 1 %
Eosinophils Absolute: 0.3 K/uL (ref 0.0–0.5)
Eosinophils Relative: 4 %
HCT: 44.1 % (ref 36.0–46.0)
Hemoglobin: 14.9 g/dL (ref 12.0–15.0)
Immature Granulocytes: 0 %
Lymphocytes Relative: 40 %
Lymphs Abs: 2.5 K/uL (ref 0.7–4.0)
MCH: 31.4 pg (ref 26.0–34.0)
MCHC: 33.8 g/dL (ref 30.0–36.0)
MCV: 92.8 fL (ref 80.0–100.0)
Monocytes Absolute: 0.5 K/uL (ref 0.1–1.0)
Monocytes Relative: 8 %
Neutro Abs: 3 K/uL (ref 1.7–7.7)
Neutrophils Relative %: 47 %
Platelet Count: 250 K/uL (ref 150–400)
RBC: 4.75 MIL/uL (ref 3.87–5.11)
RDW: 13.2 % (ref 11.5–15.5)
WBC Count: 6.3 K/uL (ref 4.0–10.5)
nRBC: 0 % (ref 0.0–0.2)

## 2024-11-15 LAB — IRON AND TIBC
Iron: 62 ug/dL (ref 28–170)
Saturation Ratios: 13 % (ref 10.4–31.8)
TIBC: 475 ug/dL — ABNORMAL HIGH (ref 250–450)
UIBC: 413 ug/dL

## 2024-11-15 LAB — FERRITIN: Ferritin: 142 ng/mL (ref 11–307)

## 2024-11-17 ENCOUNTER — Ambulatory Visit: Attending: Cardiology | Admitting: Cardiology

## 2024-11-17 ENCOUNTER — Encounter: Payer: Self-pay | Admitting: Cardiology

## 2024-11-17 VITALS — BP 122/78 | HR 67 | Ht 63.0 in | Wt 154.4 lb

## 2024-11-17 DIAGNOSIS — I10 Essential (primary) hypertension: Secondary | ICD-10-CM | POA: Diagnosis not present

## 2024-11-17 DIAGNOSIS — Z5309 Procedure and treatment not carried out because of other contraindication: Secondary | ICD-10-CM

## 2024-11-17 DIAGNOSIS — E782 Mixed hyperlipidemia: Secondary | ICD-10-CM | POA: Diagnosis not present

## 2024-11-17 DIAGNOSIS — I480 Paroxysmal atrial fibrillation: Secondary | ICD-10-CM | POA: Diagnosis not present

## 2024-11-17 DIAGNOSIS — E785 Hyperlipidemia, unspecified: Secondary | ICD-10-CM

## 2024-11-17 NOTE — Progress Notes (Signed)
 Cardiology Office Note:    Date:  11/17/2024   ID:  Rachel Hensley, DOB May 15, 1956, MRN [id number]  PCP:  Sherre Clapper, MD  Cardiologist:  Lamar Fitch, MD    Referring MD: Sherre Clapper, MD   No chief complaint on file. Doing fine  History of Present Illness:    Rachel Hensley is a 68 y.o. female with past medical history significant for paroxysmal atrial fibrillation, rhythm control strategy with flecainide  50 twice daily she is anticoagulated.  In the preparation for antiarrhythmic therapy stress test was done which was negative echocardiogram was normal.  Comes today to my office for follow-up overall doing well.  Denies having chest pain tightness squeezing pressure mentions no palpitation dizziness.  She complained of having knee pain as well as back pain.  She is taking care of her 18-month-old grandson.  Past Medical History:  Diagnosis Date   A-fib (HCC)    ACEI/ARB contraindicated 01/21/2022   Acute left-sided low back pain without sciatica 12/06/2022   Angioedema 04/05/2018   Back pain of lumbar region with sciatica 05/07/2020   Chronic pain of left knee 07/29/2023   Diabetic glomerulopathy (HCC) 02/19/2020   Diarrhea 04/11/2023   Dysfunctional gallbladder 04/16/2024   Dyslipidemia 04/03/2024   Encounter for screening mammogram for malignant neoplasm of breast 06/10/2024   GERD (gastroesophageal reflux disease)    History of angiotensin converting enzyme inhibitor (ACE-I) allergy 09/20/2024   History of migraine    Hyperlipidemia    Hypertension    Hypoglycemia due to type 2 diabetes mellitus (HCC) 08/21/2020   Hypomagnesemia 10/05/2022   Impingement syndrome of right shoulder 10/19/2021   Mixed hyperlipidemia    Nodule of skin of right lower leg 10/19/2021   Nontoxic multinodular goiter    Other vitamin B12 deficiency anemias    Pedal edema 12/04/2022   Primary insomnia 05/20/2020   Primary osteoarthritis of left hip 05/20/2020   Seasonal allergic  rhinitis due to pollen 03/05/2024   Solitary pulmonary nodule    Type 2 diabetes mellitus with other specified complication [facility])     Past Surgical History:  Procedure Laterality Date   ABDOMINAL HYSTERECTOMY  06/1993   partial; still has both ovaries   HYSTEROTOMY     partial    Current Medications: Current Meds  Medication Sig   apixaban  (ELIQUIS ) 5 MG TABS tablet Take 1 tablet (5 mg total) by mouth 2 (two) times daily.   atorvastatin  (LIPITOR) 20 MG tablet Take 1 tablet by mouth once daily   cetirizine (ZYRTEC) 10 MG tablet Take 10 mg by mouth daily.   diclofenac (VOLTAREN) 75 MG EC tablet Take 75 mg by mouth 2 (two) times daily.   diltiazem (CARDIZEM) 30 MG tablet Take 30 mg by mouth every 6 (six) hours as needed (HR).   empagliflozin  (JARDIANCE ) 25 MG TABS tablet Take 1 tablet (25 mg total) by mouth daily. Take one tablet by mouth daily   EPINEPHrine  0.3 mg/0.3 mL IJ SOAJ injection Inject 0.3 mg into the muscle once.   fenofibrate  micronized (LOFIBRA) 67 MG capsule Take 1 capsule (67 mg total) by mouth daily before breakfast.   flecainide  (TAMBOCOR ) 50 MG tablet Take 1 tablet by mouth twice daily   gabapentin  (NEURONTIN ) 100 MG capsule Take 1 capsule (100 mg total) by mouth 3 (three) times daily.   glimepiride  (AMARYL ) 4 MG tablet Take 1 tablet (4 mg total) by mouth daily with breakfast.   icosapent  Ethyl (VASCEPA ) 1 g capsule Take 2 capsules (  2 g total) by mouth 2 (two) times daily.   magnesium oxide (MAG-OX) 400 (240 Mg) MG tablet Take 800 mg by mouth 2 (two) times daily.   metoprolol  succinate (TOPROL -XL) 50 MG 24 hr tablet TAKE 1 TABLET BY MOUTH ONCE DAILY -  TAKE  WITH  OR  IMMEDIATELY  FOLLOWING  A  MEAL   omeprazole  (PRILOSEC) 20 MG capsule Take 1 capsule by mouth twice daily (Patient taking differently: Take 20 mg by mouth as needed.)   ondansetron  (ZOFRAN  ODT) 4 MG disintegrating tablet Take 1 tablet (4 mg total) by mouth every 8 (eight) hours as needed for nausea or  vomiting.   promethazine  (PHENERGAN ) 25 MG tablet Take 1 tablet (25 mg total) by mouth every 8 (eight) hours as needed for nausea or vomiting.   Semaglutide , 2 MG/DOSE, 8 MG/3ML SOPN Inject 2 mg as directed once a week.     Allergies:   Actos [pioglitazone], Fenofibrate , Lisinopril, and Prednisone   Social History   Socioeconomic History   Marital status: Widowed    Spouse name: Not on file   Number of children: 3   Years of education: Not on file   Highest education level: GED or equivalent  Occupational History   Occupation: RETIRED SEWER  Tobacco Use   Smoking status: Never   Smokeless tobacco: Never  Vaping Use   Vaping status: Never Used  Substance and Sexual Activity   Alcohol use: Never   Drug use: Never   Sexual activity: Not Currently  Other Topics Concern   Not on file  Social History Narrative   Not on file   Social Drivers of Health   Financial Resource Strain: Low Risk  (07/06/2024)   Overall Financial Resource Strain (CARDIA)    Difficulty of Paying Living Expenses: Not hard at all  Food Insecurity: No Food Insecurity (07/06/2024)   Hunger Vital Sign    Worried About Running Out of Food in the Last Year: Never true    Ran Out of Food in the Last Year: Never true  Transportation Needs: No Transportation Needs (07/06/2024)   PRAPARE - Administrator, Civil Service (Medical): No    Lack of Transportation (Non-Medical): No  Physical Activity: Inactive (07/06/2024)   Exercise Vital Sign    Days of Exercise per Week: 0 days    Minutes of Exercise per Session: 0 min  Stress: No Stress Concern Present (07/06/2024)   [employer] of Occupational Health - Occupational Stress Questionnaire    Feeling of Stress: Not at all  Social Connections: Moderately Isolated (07/06/2024)   Social Connection and Isolation Panel    Frequency of Communication with Friends and Family: More than three times a week    Frequency of Social Gatherings with Friends and  Family: Three times a week    Attends Religious Services: More than 4 times per year    Active Member of Clubs or Organizations: No    Attends Banker Meetings: Never    Marital Status: Widowed     Family History: The patient's family history includes Arrhythmia in her son; Asthma in her son; Atopy in her grandchild; Atrial fibrillation in her sister and son; Breast cancer in her paternal aunt; Colon cancer in her father and paternal uncle; Coronary artery disease in her brother and mother; Diabetes in her brother, brother, and sister; Diabetes type II in her brother; Eczema in her grandchild; Glaucoma in her mother; Kidney cancer in her brother; Lymphoma in her  maternal aunt; Parkinsonism in her son; Prostate cancer in her brother; Throat cancer in her cousin. There is no history of Allergic rhinitis, Angioedema, Immunodeficiency, or Urticaria. ROS:   Please see the history of present illness.    All 14 point review of systems negative except as described per history of present illness  EKGs/Labs/Other Studies Reviewed:    EKG Interpretation Date/Time:  Friday November 17 2024 14:33:06 EST Ventricular Rate:  67 PR Interval:  198 QRS Duration:  104 QT Interval:  448 QTC Calculation: 473 R Axis:   -3  Text Interpretation: Normal sinus rhythm Nonspecific T wave abnormality Prolonged QT No previous ECGs available Confirmed by Bernie Charleston [phone]) on 11/17/2024 2:50:16 PM    Recent Labs: 09/15/2024: ALT 28; BUN 28; Creatinine, Ser 0.76; Magnesium 2.0; Potassium 4.2; Sodium 140 11/15/2024: Hemoglobin 14.9; Platelet Count 250  Recent Lipid Panel    Component Value Date/Time   CHOL 174 06/06/2024 0859   TRIG 214 (H) 06/06/2024 0859   HDL 47 06/06/2024 0859   CHOLHDL 3.7 06/06/2024 0859   LDLCALC 91 06/06/2024 0859    Physical Exam:    VS:  BP 122/78   Pulse 67   Ht 5' 3 (1.6 m)   Wt 154 lb 6.4 oz (70 kg)   LMP  (LMP Unknown)   SpO2 98%   BMI 27.35 kg/m      Wt Readings from Last 3 Encounters:  11/17/24 154 lb 6.4 oz (70 kg)  11/15/24 153 lb (69.4 kg)  09/15/24 149 lb (67.6 kg)     GEN:  Well nourished, well developed in no acute distress HEENT: Normal NECK: No JVD; No carotid bruits LYMPHATICS: No lymphadenopathy CARDIAC: RRR, no murmurs, no rubs, no gallops RESPIRATORY:  Clear to auscultation without rales, wheezing or rhonchi  ABDOMEN: Soft, non-tender, non-distended MUSCULOSKELETAL:  No edema; No deformity  SKIN: Warm and dry LOWER EXTREMITIES: no swelling NEUROLOGIC:  Alert and oriented x 3 PSYCHIATRIC:  Normal affect   ASSESSMENT:    1. Essential hypertension, benign   2. Mixed hyperlipidemia   3. Paroxysmal atrial fibrillation (HCC)   4. Primary hypertension   5. Dyslipidemia   6. ACEI/ARB contraindicated    PLAN:    In order of problems listed above:  Paroxysmal atrial fibrillation, denies have any palpitations antiarrhythmic on board, continue anticoagulation. Mixed dyslipidemia, she is taking Lipitor 20 which I will continue I did review KPN which show me her LDL of 91 HDL 45 this is from summer we will talk about potentially augmenting her therapy. Essential hypertension: Blood pressure well-controlled continue present management   Medication Adjustments/Labs and Tests Ordered: Current medicines are reviewed at length with the patient today.  Concerns regarding medicines are outlined above.  Orders Placed This Encounter  Procedures   EKG 12-Lead   Medication changes: No orders of the defined types were placed in this encounter.   Signed, [place] DOROTHA Bernie, MD, [facility] 11/17/2024 3:05 PM    [facility]

## 2024-11-17 NOTE — Patient Instructions (Signed)

## 2024-12-05 ENCOUNTER — Other Ambulatory Visit: Payer: Self-pay | Admitting: Family Medicine

## 2025-01-09 ENCOUNTER — Other Ambulatory Visit: Payer: Self-pay | Admitting: Family Medicine

## 2025-01-17 ENCOUNTER — Ambulatory Visit: Admitting: Family Medicine

## 2025-01-17 ENCOUNTER — Encounter: Payer: Self-pay | Admitting: Family Medicine

## 2025-01-17 VITALS — BP 100/64 | HR 74 | Temp 97.4°F | Resp 16 | Ht 63.0 in | Wt 153.0 lb

## 2025-01-17 DIAGNOSIS — I10 Essential (primary) hypertension: Secondary | ICD-10-CM | POA: Diagnosis not present

## 2025-01-17 DIAGNOSIS — Z7985 Long-term (current) use of injectable non-insulin antidiabetic drugs: Secondary | ICD-10-CM | POA: Diagnosis not present

## 2025-01-17 DIAGNOSIS — E782 Mixed hyperlipidemia: Secondary | ICD-10-CM

## 2025-01-17 DIAGNOSIS — E1121 Type 2 diabetes mellitus with diabetic nephropathy: Secondary | ICD-10-CM | POA: Diagnosis not present

## 2025-01-17 DIAGNOSIS — S42402A Unspecified fracture of lower end of left humerus, initial encounter for closed fracture: Secondary | ICD-10-CM | POA: Diagnosis not present

## 2025-01-17 DIAGNOSIS — Z7984 Long term (current) use of oral hypoglycemic drugs: Secondary | ICD-10-CM | POA: Diagnosis not present

## 2025-01-17 DIAGNOSIS — K219 Gastro-esophageal reflux disease without esophagitis: Secondary | ICD-10-CM | POA: Diagnosis not present

## 2025-01-17 DIAGNOSIS — I480 Paroxysmal atrial fibrillation: Secondary | ICD-10-CM

## 2025-01-17 LAB — POCT LIPID PANEL
HDL: 53
LDL/HDL Ratio: 1.1
LDL: 56
Non-HDL: 107
TC: 160
TRG: 254

## 2025-01-17 LAB — POCT GLYCOSYLATED HEMOGLOBIN (HGB A1C): HbA1c POC (<> result, manual entry): 6.3 %

## 2025-01-17 MED ORDER — FENOFIBRATE 48 MG PO TABS: 48.0000 mg | ORAL_TABLET | Freq: Every day | ORAL | 1 refills | Status: AC

## 2025-01-17 NOTE — Assessment & Plan Note (Addendum)
 A1c at 6.3, blood glucose in low 100s, occasional up to 150. Neuropathy with thigh numbness on prolonged standing. No recent hypoglycemia. - Continue Ozempic  2 mg weekly. - Continue Jardiance  25 mg daily. - Continue glimepiride  4 mg before supper. - Monitor blood glucose levels regularly. Orders:   POCT glycosylated hemoglobin (Hb A1C)   Microalbumin / creatinine urine ratio

## 2025-01-17 NOTE — Assessment & Plan Note (Addendum)
 Managed with Eliquis , flecainide , and metoprolol . No recent palpitations or tachycardia. - Continue Eliquis  5 mg twice daily. - Continue metoprolol  XL 50 mg daily. - Use diltiazem 30 mg as needed for palpitations.

## 2025-01-17 NOTE — Assessment & Plan Note (Signed)
 Well controlled.  No changes to medicines. Continue metoprolol  50 mg daily. Continue Aspirin 81 mg daily. Continue to work on eating a healthy diet and exercise.  Labs drawn today.  Orders:   CBC with Differential/Platelet   Comprehensive metabolic panel with GFR

## 2025-01-17 NOTE — Progress Notes (Signed)
 "  Subjective:  Patient ID: Rachel Hensley, female    DOB: [birth year]  Age: 69 y.o. MRN: [id number]  Chief Complaint  Patient presents with   Medical Management of Chronic Issues    HPI: Discussed the use of AI scribe software for clinical note transcription with the patient, who gave verbal consent to proceed.  History of Present Illness Rachel Hensley is a 69 year old female who presents for follow-up after a fall resulting in a fracture of the distal left humerus.  Left upper extremity fracture and recovery - Sustained a closed fracture of the distal left humerus in December after a fall at home - Underwent surgical intervention for the fracture - Currently participating in physical therapy - Improvement in movement of the left arm - Persistent swelling of the left upper extremity - Uses a protective device when outside - Experiences manageable pain, particularly after physical therapy  Glycemic control and diabetes management - Diabetes mellitus with blood glucose levels typically in the low 100s, ranging from 88 to 150 - Hemoglobin A1c is 6.3 - Monitors blood glucose two to three times weekly - Medications: Ozempic  2 mg weekly, Jardiance  25 mg daily, glimepiride  4 mg before largest meal - Experienced two hypoglycemic episodes post-fall, lowest blood glucose 68  Hyperlipidemia and lipid management - Total cholesterol 160, HDL 53, triglycerides 254, LDL 56 - Medications: atorvastatin  20 mg daily, Vascepa  2 grams twice daily - Unable to obtain fenofibrate  due to unavailability at the pharmacy  Atrial fibrillation and cardiac symptoms - Atrial fibrillation managed with Eliquis  5 mg twice daily, flecainide  50 mg twice daily, metoprolol  XL 50 mg daily - Diltiazem 30 mg four times daily as needed for palpitations, not needed recently - No chest pain, palpitations, or shortness of breath  Peripheral neuropathy - Numbness in thighs when standing for extended periods -  Gabapentin  100 mg three times daily, often taken only twice daily due to reduced pain  Gastrointestinal symptoms - Occasional indigestion, managed with over-the-counter medication as needed  Constitutional and other symptoms - No fevers, chills, sweats, earaches, sore throat, stuffy nose, headaches, or dizziness       11/15/2024   10:00 AM 09/15/2024    9:57 AM 07/06/2024    3:22 PM 06/06/2024    8:14 AM 04/13/2024   11:18 AM  Depression screen PHQ 2/9  Decreased Interest 0 0 0 0 0  Down, Depressed, Hopeless 0 0 0 0 0  PHQ - 2 Score 0 0 0 0 0  Altered sleeping   1 1   Tired, decreased energy   0 0   Change in appetite   0 0   Feeling bad or failure about yourself    0 0   Trouble concentrating   0 0   Moving slowly or fidgety/restless   0 0   Suicidal thoughts   0 0   PHQ-9 Score   1  1    Difficult doing work/chores    Not difficult at all      Data saved with a previous flowsheet row definition        09/15/2024    9:56 AM  Fall Risk   Falls in the past year? 0  Number falls in past yr: 0  Injury with Fall? 0   Risk for fall due to : No Fall Risks  Follow up Falls evaluation completed     Data saved with a previous flowsheet row definition    Patient Care  Team: Sherre Clapper, MD as PCP - General (Family Medicine) Bernie Lamar PARAS, MD as Consulting Physician (Cardiology)   Review of Systems  Constitutional:  Negative for chills, fatigue and fever.  HENT:  Negative for congestion, ear pain and sore throat.   Respiratory:  Negative for cough and shortness of breath.   Cardiovascular:  Negative for chest pain.  Gastrointestinal:  Negative for abdominal pain, constipation, diarrhea, nausea and vomiting.  Genitourinary:  Negative for dysuria and urgency.  Musculoskeletal:  Negative for arthralgias and myalgias.  Skin:  Negative for rash.  Neurological:  Negative for dizziness and headaches.  Psychiatric/Behavioral:  Negative for dysphoric mood. The patient is not  nervous/anxious.     Medications Ordered Prior to Encounter[1] Past Medical History:  Diagnosis Date   A-fib (HCC)    ACEI/ARB contraindicated 01/21/2022   Acute left-sided low back pain without sciatica 12/06/2022   Angioedema 04/05/2018   Back pain of lumbar region with sciatica 05/07/2020   Chronic pain of left knee 07/29/2023   Diabetic glomerulopathy (HCC) 02/19/2020   Diarrhea 04/11/2023   Dysfunctional gallbladder 04/16/2024   Dyslipidemia 04/03/2024   Encounter for screening mammogram for malignant neoplasm of breast 06/10/2024   GERD (gastroesophageal reflux disease)    History of angiotensin converting enzyme inhibitor (ACE-I) allergy 09/20/2024   History of migraine    Hyperlipidemia    Hypertension    Hypoglycemia due to type 2 diabetes mellitus (HCC) 08/21/2020   Hypomagnesemia 10/05/2022   Impingement syndrome of right shoulder 10/19/2021   Mixed hyperlipidemia    Nodule of skin of right lower leg 10/19/2021   Nontoxic multinodular goiter    Other vitamin B12 deficiency anemias    Pedal edema 12/04/2022   Primary insomnia 05/20/2020   Primary osteoarthritis of left hip 05/20/2020   Seasonal allergic rhinitis due to pollen 03/05/2024   Solitary pulmonary nodule    Type 2 diabetes mellitus with other specified complication [facility])    Past Surgical History:  Procedure Laterality Date   ABDOMINAL HYSTERECTOMY  06/1993   partial; still has both ovaries   HYSTEROTOMY     partial    Family History  Problem Relation Age of Onset   Coronary artery disease Mother    Glaucoma Mother    Colon cancer Father    Diabetes Sister    Atrial fibrillation Sister    Kidney cancer Brother    Diabetes type II Brother    Diabetes Brother    Prostate cancer Brother    Diabetes Brother    Coronary artery disease Brother    Asthma Son        childhood asthma   Arrhythmia Son    Atrial fibrillation Son    Parkinsonism Son    Eczema Grandchild    Atopy Grandchild     Colon cancer Paternal Uncle    Throat cancer Cousin    Lymphoma Maternal Aunt    Breast cancer Paternal Aunt    Allergic rhinitis Neg Hx    Angioedema Neg Hx    Immunodeficiency Neg Hx    Urticaria Neg Hx    Social History   Socioeconomic History   Marital status: Widowed    Spouse name: Not on file   Number of children: 3   Years of education: Not on file   Highest education level: GED or equivalent  Occupational History   Occupation: RETIRED SEWER  Tobacco Use   Smoking status: Never   Smokeless tobacco: Never  Vaping Use  Vaping status: Never Used  Substance and Sexual Activity   Alcohol use: Never   Drug use: Never   Sexual activity: Not Currently  Other Topics Concern   Not on file  Social History Narrative   Not on file   Social Drivers of Health   Tobacco Use: Low Risk (01/17/2025)   Patient History    Smoking Tobacco Use: Never    Smokeless Tobacco Use: Never    Passive Exposure: Not on file  Financial Resource Strain: Low Risk (07/06/2024)   Overall Financial Resource Strain (CARDIA)    Difficulty of Paying Living Expenses: Not hard at all  Food Insecurity: No Food Insecurity (07/06/2024)   Epic    Worried About Radiation Protection Practitioner of Food in the Last Year: Never true    Ran Out of Food in the Last Year: Never true  Transportation Needs: No Transportation Needs (07/06/2024)   Epic    Lack of Transportation (Medical): No    Lack of Transportation (Non-Medical): No  Physical Activity: Inactive (07/06/2024)   Exercise Vital Sign    Days of Exercise per Week: 0 days    Minutes of Exercise per Session: 0 min  Stress: No Stress Concern Present (07/06/2024)   [employer] of Occupational Health - Occupational Stress Questionnaire    Feeling of Stress: Not at all  Social Connections: Moderately Isolated (07/06/2024)   Social Connection and Isolation Panel    Frequency of Communication with Friends and Family: More than three times a week    Frequency of Social  Gatherings with Friends and Family: Three times a week    Attends Religious Services: More than 4 times per year    Active Member of Clubs or Organizations: No    Attends Banker Meetings: Never    Marital Status: Widowed  Depression (PHQ2-9): Low Risk (11/15/2024)   Depression (PHQ2-9)    PHQ-2 Score: 0  Alcohol Screen: Low Risk (07/06/2024)   Alcohol Screen    Last Alcohol Screening Score (AUDIT): 0  Housing: Unknown (07/06/2024)   Epic    Unable to Pay for Housing in the Last Year: No    Number of Times Moved in the Last Year: Not on file    Homeless in the Last Year: No  Utilities: Not At Risk (07/06/2024)   Epic    Threatened with loss of utilities: No  Health Literacy: Adequate Health Literacy (07/06/2024)   B1300 Health Literacy    Frequency of need for help with medical instructions: Never    Objective:  BP 100/64   Pulse 74   Temp (!) 97.4 F (36.3 C)   Resp 16   Ht 5' 3 (1.6 m)   Wt 153 lb (69.4 kg)   LMP  (LMP Unknown)   SpO2 99%   BMI 27.10 kg/m      01/17/2025    8:29 AM 11/17/2024    2:31 PM 11/15/2024   10:21 AM  BP/Weight  Systolic BP 100 122 131  Diastolic BP 64 78 64  Wt. (Lbs) 153 154.4 153  BMI 27.1 kg/m2 27.35 kg/m2 27.1 kg/m2    Physical Exam Vitals reviewed.  Constitutional:      Appearance: Normal appearance. She is normal weight.  Neck:     Vascular: No carotid bruit.  Cardiovascular:     Rate and Rhythm: Normal rate and regular rhythm.     Pulses: Normal pulses.     Heart sounds: Normal heart sounds.  Pulmonary:  Effort: Pulmonary effort is normal. No respiratory distress.     Breath sounds: Normal breath sounds.  Abdominal:     General: Abdomen is flat. Bowel sounds are normal.     Palpations: Abdomen is soft.     Tenderness: There is no abdominal tenderness.  Musculoskeletal:     Comments: Left arm in a splint.   Neurological:     Mental Status: She is alert and oriented to person, place, and time.   Psychiatric:        Mood and Affect: Mood normal.        Behavior: Behavior normal.      Diabetic foot exam was performed with the following findings:   No deformities, ulcerations, or other skin breakdown Normal sensation of 10g monofilament Intact posterior tibialis and dorsalis pedis pulses      Lab Results  Component Value Date   WBC 8.5 01/17/2025   HGB 14.8 01/17/2025   HCT 43.5 01/17/2025   PLT 250 01/17/2025   GLUCOSE 114 (H) 01/17/2025   CHOL 174 06/06/2024   TRIG 214 (H) 06/06/2024   HDL 47 06/06/2024   LDLCALC 91 06/06/2024   ALT 27 01/17/2025   AST 24 01/17/2025   NA 141 01/17/2025   K 4.3 01/17/2025   CL 106 01/17/2025   CREATININE 0.79 01/17/2025   BUN 25 01/17/2025   CO2 17 (L) 01/17/2025   TSH 0.777 01/19/2023   HGBA1C 6.3 01/17/2025    Results for orders placed or performed in visit on 01/17/25  POCT Lipid Panel   Collection Time: 01/17/25  8:56 AM  Result Value Ref Range   TC 160    HDL 53    TRG 254    LDL 56    Non-HDL 107    TC/HDL     LDL/HDL Ratio 1.1   POCT glycosylated hemoglobin (Hb A1C)   Collection Time: 01/17/25  8:57 AM  Result Value Ref Range   Hemoglobin A1C     HbA1c POC (<> result, manual entry) 6.3 4.0 - 5.6 %   HbA1c, POC (prediabetic range)     HbA1c, POC (controlled diabetic range)    CBC with Differential/Platelet   Collection Time: 01/17/25  9:41 AM  Result Value Ref Range   WBC 8.5 3.4 - 10.8 x10E3/uL   RBC 4.66 3.77 - 5.28 x10E6/uL   Hemoglobin 14.8 11.1 - 15.9 g/dL   Hematocrit 56.4 65.9 - 46.6 %   MCV 93 79 - 97 fL   MCH 31.8 26.6 - 33.0 pg   MCHC 34.0 31.5 - 35.7 g/dL   RDW 86.5 88.2 - 84.5 %   Platelets 250 150 - 450 x10E3/uL   Neutrophils 47 Not Estab. %   Lymphs 39 Not Estab. %   Monocytes 9 Not Estab. %   Eos 4 Not Estab. %   Basos 1 Not Estab. %   Neutrophils Absolute 4.1 1.4 - 7.0 x10E3/uL   Lymphocytes Absolute 3.3 (H) 0.7 - 3.1 x10E3/uL   Monocytes Absolute 0.7 0.1 - 0.9 x10E3/uL   EOS  (ABSOLUTE) 0.3 0.0 - 0.4 x10E3/uL   Basophils Absolute 0.1 0.0 - 0.2 x10E3/uL   Immature Granulocytes 0 Not Estab. %   Immature Grans (Abs) 0.0 0.0 - 0.1 x10E3/uL  Comprehensive metabolic panel with GFR   Collection Time: 01/17/25  9:41 AM  Result Value Ref Range   Glucose 114 (H) 70 - 99 mg/dL   BUN 25 8 - 27 mg/dL   Creatinine, Ser 9.20  0.57 - 1.00 mg/dL   eGFR 80 >40 fO/fpw/8.26   BUN/Creatinine Ratio 32 (H) 12 - 28   Sodium 141 134 - 144 mmol/L   Potassium 4.3 3.5 - 5.2 mmol/L   Chloride 106 96 - 106 mmol/L   CO2 17 (L) 20 - 29 mmol/L   Calcium  9.7 8.7 - 10.3 mg/dL   Total Protein 7.3 6.0 - 8.5 g/dL   Albumin 4.5 3.9 - 4.9 g/dL   Globulin, Total 2.8 1.5 - 4.5 g/dL   Bilirubin Total 0.8 0.0 - 1.2 mg/dL   Alkaline Phosphatase 130 49 - 135 IU/L   AST 24 0 - 40 IU/L   ALT 27 0 - 32 IU/L  Microalbumin / creatinine urine ratio   Collection Time: 01/17/25 10:43 AM  Result Value Ref Range   Creatinine, Urine 102.6 Not Estab. mg/dL   Microalbumin, Urine 51.4 Not Estab. ug/mL   Microalb/Creat Ratio 47 (H) 0 - 29 mg/g creat  .  Assessment & Plan:   Assessment & Plan Diabetic glomerulopathy (HCC) A1c at 6.3, blood glucose in low 100s, occasional up to 150. Neuropathy with thigh numbness on prolonged standing. No recent hypoglycemia. - Continue Ozempic  2 mg weekly. - Continue Jardiance  25 mg daily. - Continue glimepiride  4 mg before supper. - Monitor blood glucose levels regularly. Orders:   POCT glycosylated hemoglobin (Hb A1C)   Microalbumin / creatinine urine ratio   Mixed hyperlipidemia Total cholesterol 160, HDL 53, triglycerides 254, LDL 56. Fenofibrate  unavailable for a month. Atorvastatin  and Vascepa  taken as prescribed. - Will check availability of fenofibrate  at pharmacy. Sent fenofibrate  48 mg daily.  - Continue atorvastatin  20 mg daily. - Continue Vascepa  2 grams twice daily. Orders:   POCT Lipid Panel   fenofibrate  (TRICOR ) 48 MG tablet; Take 1 tablet (48 mg  total) by mouth daily.   Primary hypertension Well controlled.  No changes to medicines. Continue metoprolol  50 mg daily. Continue Aspirin 81 mg daily. Continue to work on eating a healthy diet and exercise.  Labs drawn today.  Orders:   CBC with Differential/Platelet   Comprehensive metabolic panel with GFR   Paroxysmal atrial fibrillation (HCC) Managed with Eliquis , flecainide , and metoprolol . No recent palpitations or tachycardia. - Continue Eliquis  5 mg twice daily. - Continue metoprolol  XL 50 mg daily. - Use diltiazem 30 mg as needed for palpitations.    Gastroesophageal reflux disease without esophagitis Occasional severe symptoms requiring rabeprazole. Previously advised OTC, but rabeprazole needed for severe symptoms. - Continue rabeprazole as needed for severe symptoms.    Closed fracture of distal end of left humerus, unspecified fracture morphology, initial encounter Closed fracture of distal left humerus, post-surgical Post-surgical status with improved mobility through physical therapy. Mild swelling, no significant pain. - Continue physical therapy. Management per specialist.       Body mass index is 27.1 kg/m.    Meds ordered this encounter  Medications   fenofibrate  (TRICOR ) 48 MG tablet    Sig: Take 1 tablet (48 mg total) by mouth daily.    Dispense:  90 tablet    Refill:  1    Trying low dose despite previous side effect.    Orders Placed This Encounter  Procedures   Microalbumin / creatinine urine ratio   CBC with Differential/Platelet   Comprehensive metabolic panel with GFR   POCT glycosylated hemoglobin (Hb A1C)   POCT Lipid Panel     I,Marla I Leal-Borjas,acting as a scribe for Abigail Free, MD.,have documented all relevant documentation on  the behalf of Abigail Free, MD,as directed by  Abigail Free, MD while in the presence of Abigail Free, MD.   Follow-up: Return in about 3 months (around 04/17/2025) for chronic fasting.  An After Visit  Summary was printed and given to the patient.  I attest that I have reviewed this visit and agree with the plan scribed by my staff.   Abigail Free, MD [facility] [phone]       [1]  Current Outpatient Medications on File Prior to Visit  Medication Sig Dispense Refill   acetaminophen (TYLENOL) 650 MG CR tablet Take 1,300 mg by mouth every 8 (eight) hours as needed.     apixaban  (ELIQUIS ) 5 MG TABS tablet Take 1 tablet (5 mg total) by mouth 2 (two) times daily. 180 tablet 1   atorvastatin  (LIPITOR) 20 MG tablet Take 1 tablet by mouth once daily 90 tablet 0   cetirizine (ZYRTEC) 10 MG tablet Take 10 mg by mouth daily.     diltiazem (CARDIZEM) 30 MG tablet Take 30 mg by mouth every 6 (six) hours as needed (HR).     empagliflozin  (JARDIANCE ) 25 MG TABS tablet Take 1 tablet (25 mg total) by mouth daily. Take one tablet by mouth daily 90 tablet 3   EPINEPHrine  0.3 mg/0.3 mL IJ SOAJ injection Inject 0.3 mg into the muscle once.     flecainide  (TAMBOCOR ) 50 MG tablet Take 1 tablet by mouth twice daily 180 tablet 2   gabapentin  (NEURONTIN ) 100 MG capsule Take 1 capsule (100 mg total) by mouth 3 (three) times daily. 90 capsule 3   glimepiride  (AMARYL ) 4 MG tablet Take 1 tablet (4 mg total) by mouth daily with breakfast. 90 tablet 1   icosapent  Ethyl (VASCEPA ) 1 g capsule Take 2 capsules (2 g total) by mouth 2 (two) times daily. 360 capsule 1   magnesium oxide (MAG-OX) 400 (240 Mg) MG tablet Take 800 mg by mouth 2 (two) times daily.     metoprolol  succinate (TOPROL -XL) 50 MG 24 hr tablet TAKE 1 TABLET BY MOUTH ONCE DAILY -  TAKE  WITH  OR  IMMEDIATELY  FOLLOWING  A  MEAL 90 tablet 2   omeprazole  (PRILOSEC) 20 MG capsule Take 1 capsule by mouth twice daily (Patient taking differently: Take 20 mg by mouth as needed.) 180 capsule 0   ondansetron  (ZOFRAN  ODT) 4 MG disintegrating tablet Take 1 tablet (4 mg total) by mouth every 8 (eight) hours as needed for nausea or vomiting. 30 tablet 0    OZEMPIC , 2 MG/DOSE, 8 MG/3ML SOPN INJECT 2MG   SUBCUTANEOUSLY ONCE A WEEK 9 mL 0   promethazine  (PHENERGAN ) 25 MG tablet Take 1 tablet (25 mg total) by mouth every 8 (eight) hours as needed for nausea or vomiting. 20 tablet 0   No current facility-administered medications on file prior to visit.   "

## 2025-01-17 NOTE — Assessment & Plan Note (Addendum)
 Occasional severe symptoms requiring rabeprazole. Previously advised OTC, but rabeprazole needed for severe symptoms. - Continue rabeprazole as needed for severe symptoms.

## 2025-01-17 NOTE — Progress Notes (Unsigned)
" ° °  01/17/2025  Patient ID: Rachel Hensley, female   DOB: Nov 30, 1956, 69 y.o.   MRN: [id number]  Received question from PCP concerning coverage of DM meds, Vascepa  grant, and eliquis  coverage.    DM meds - through [employer] should not have copay on covered DM meds.  Vascepa  grant - expired, needs to be re-enrolled. Will assist with re-enrollment. [employer] Patient ID: [id number].  Eliquis  - previously enrolled in PAP - $62.26/month is the set copay. If she spends 3% of her income on medications, will qualify.   Spoke with patient and provided contact information while she was in office.   Lang Sieve, PharmD, BCGP Clinical Pharmacist  [phone]   "

## 2025-01-17 NOTE — Patient Instructions (Addendum)
" °  VISIT SUMMARY: Today, we discussed your recovery from the fracture of your left arm, your diabetes management, cholesterol levels, heart condition, and occasional indigestion. You are making good progress with physical therapy and your blood glucose levels are well controlled.  YOUR PLAN: CLOSED FRACTURE OF DISTAL LEFT HUMERUS, POST-SURGICAL: You have improved mobility through physical therapy with mild swelling and no significant pain. -Continue physical therapy.  TYPE 2 DIABETES MELLITUS WITH DIABETIC NEPHROPATHY AND NEUROPATHY: Your A1c is at 6.3 and blood glucose levels are generally in the low 100s. You experience thigh numbness when standing for long periods. -Continue Ozempic  2 mg weekly. -Continue Jardiance  25 mg daily. -Continue glimepiride  4 mg before supper. -Monitor blood glucose levels regularly.  MIXED HYPERLIPIDEMIA: Your cholesterol levels are being managed with atorvastatin  and Vascepa , but fenofibrate  has been unavailable for a month. -Check availability of fenofibrate  at pharmacy. -Continue atorvastatin  20 mg daily. -Continue Vascepa  2 grams twice daily.  PAROXYSMAL ATRIAL FIBRILLATION: Your heart condition is managed with Eliquis , flecainide , and metoprolol . You have not had recent palpitations or tachycardia. -Continue Eliquis  5 mg twice daily. -Continue metoprolol  XL 50 mg daily. -Use diltiazem 30 mg as needed for palpitations.  GASTROESOPHAGEAL REFLUX DISEASE: You have occasional severe symptoms that require omeprazole . -restart omeprazole  40 mg daily. Use up 20 mg 2 daily and let me know when you need a refill "

## 2025-01-17 NOTE — Assessment & Plan Note (Addendum)
 Total cholesterol 160, HDL 53, triglycerides 254, LDL 56. Fenofibrate  unavailable for a month. Atorvastatin  and Vascepa  taken as prescribed. - Will check availability of fenofibrate  at pharmacy. Sent fenofibrate  48 mg daily.  - Continue atorvastatin  20 mg daily. - Continue Vascepa  2 grams twice daily. Orders:   POCT Lipid Panel   fenofibrate  (TRICOR ) 48 MG tablet; Take 1 tablet (48 mg total) by mouth daily.

## 2025-01-18 ENCOUNTER — Ambulatory Visit: Payer: Self-pay | Admitting: Family Medicine

## 2025-01-18 LAB — MICROALBUMIN / CREATININE URINE RATIO
Creatinine, Urine: 102.6 mg/dL
Microalb/Creat Ratio: 47 mg/g{creat} — ABNORMAL HIGH (ref 0–29)
Microalbumin, Urine: 48.5 ug/mL

## 2025-01-18 LAB — COMPREHENSIVE METABOLIC PANEL WITH GFR
ALT: 27 IU/L (ref 0–32)
AST: 24 IU/L (ref 0–40)
Albumin: 4.5 g/dL (ref 3.9–4.9)
Alkaline Phosphatase: 130 IU/L (ref 49–135)
BUN/Creatinine Ratio: 32 — ABNORMAL HIGH (ref 12–28)
BUN: 25 mg/dL (ref 8–27)
Bilirubin Total: 0.8 mg/dL (ref 0.0–1.2)
CO2: 17 mmol/L — ABNORMAL LOW (ref 20–29)
Calcium: 9.7 mg/dL (ref 8.7–10.3)
Chloride: 106 mmol/L (ref 96–106)
Creatinine, Ser: 0.79 mg/dL (ref 0.57–1.00)
Globulin, Total: 2.8 g/dL (ref 1.5–4.5)
Glucose: 114 mg/dL — ABNORMAL HIGH (ref 70–99)
Potassium: 4.3 mmol/L (ref 3.5–5.2)
Sodium: 141 mmol/L (ref 134–144)
Total Protein: 7.3 g/dL (ref 6.0–8.5)
eGFR: 80 mL/min/1.73

## 2025-01-18 LAB — CBC WITH DIFFERENTIAL/PLATELET
Basophils Absolute: 0.1 x10E3/uL (ref 0.0–0.2)
Basos: 1 %
EOS (ABSOLUTE): 0.3 x10E3/uL (ref 0.0–0.4)
Eos: 4 %
Hematocrit: 43.5 % (ref 34.0–46.6)
Hemoglobin: 14.8 g/dL (ref 11.1–15.9)
Immature Grans (Abs): 0 x10E3/uL (ref 0.0–0.1)
Immature Granulocytes: 0 %
Lymphocytes Absolute: 3.3 x10E3/uL — ABNORMAL HIGH (ref 0.7–3.1)
Lymphs: 39 %
MCH: 31.8 pg (ref 26.6–33.0)
MCHC: 34 g/dL (ref 31.5–35.7)
MCV: 93 fL (ref 79–97)
Monocytes Absolute: 0.7 x10E3/uL (ref 0.1–0.9)
Monocytes: 9 %
Neutrophils Absolute: 4.1 x10E3/uL (ref 1.4–7.0)
Neutrophils: 47 %
Platelets: 250 x10E3/uL (ref 150–450)
RBC: 4.66 x10E6/uL (ref 3.77–5.28)
RDW: 13.4 % (ref 11.7–15.4)
WBC: 8.5 x10E3/uL (ref 3.4–10.8)

## 2025-01-18 NOTE — Progress Notes (Unsigned)
 Approved for [employer] hld renewal   Reference Number: [id number] Avonna Iribe Fund: Hypercholesterolemia - Medicare Access Assistance Type: Co-pay Start Date: 12/19/2024 End Date: 12/18/2025 Lorrene Amount: $2,500 Lorrene Balance: $2,500  Pharmacy Card  Card No.: [id number] Card Status: Active BIN: [id number] PCN: [id number] PC Group: [id number] Help Desk: [phone] Provider: PDMI Processor: PDMI  For more information about this grant, please call [phone] or email [email or web address] for additional information.

## 2025-01-19 ENCOUNTER — Other Ambulatory Visit: Payer: Self-pay | Admitting: Family Medicine

## 2025-01-19 MED ORDER — KERENDIA 10 MG PO TABS: 1.0000 | ORAL_TABLET | Freq: Every day | ORAL | 0 refills | Status: AC

## 2025-01-21 ENCOUNTER — Encounter: Payer: Self-pay | Admitting: Family Medicine

## 2025-01-22 ENCOUNTER — Other Ambulatory Visit (HOSPITAL_COMMUNITY): Payer: Self-pay

## 2025-01-22 ENCOUNTER — Telehealth: Payer: Self-pay

## 2025-01-22 NOTE — Telephone Encounter (Signed)
 PA not submitted duplicate request

## 2025-01-22 NOTE — Telephone Encounter (Signed)
 Key [id number] for Kerendia  submitted via covermymeds.

## 2025-04-18 ENCOUNTER — Ambulatory Visit: Admitting: Family Medicine

## 2025-07-12 ENCOUNTER — Ambulatory Visit
